# Patient Record
Sex: Male | Born: 1965
Health system: Southern US, Community
[De-identification: ages and names within clinical notes are randomized; demographics above are authoritative.]

## PROBLEM LIST (undated history)

## (undated) DIAGNOSIS — K219 Gastro-esophageal reflux disease without esophagitis: Secondary | ICD-10-CM

## (undated) DIAGNOSIS — E119 Type 2 diabetes mellitus without complications: Secondary | ICD-10-CM

## (undated) DIAGNOSIS — I10 Essential (primary) hypertension: Secondary | ICD-10-CM

## (undated) DIAGNOSIS — E785 Hyperlipidemia, unspecified: Secondary | ICD-10-CM

## (undated) HISTORY — DX: Hyperlipidemia, unspecified: E78.5

## (undated) HISTORY — DX: Essential (primary) hypertension: I10

## (undated) HISTORY — DX: Gastro-esophageal reflux disease without esophagitis: K21.9

## (undated) HISTORY — DX: Type 2 diabetes mellitus without complications: E11.9

---

## 2004-09-12 ENCOUNTER — Ambulatory Visit: Payer: Self-pay | Admitting: Family Medicine

## 2004-11-05 ENCOUNTER — Ambulatory Visit: Payer: Self-pay | Admitting: Family Medicine

## 2004-11-18 ENCOUNTER — Ambulatory Visit: Payer: Self-pay | Admitting: Family Medicine

## 2005-05-27 ENCOUNTER — Ambulatory Visit: Payer: Self-pay | Admitting: Family Medicine

## 2005-11-07 ENCOUNTER — Ambulatory Visit: Payer: Self-pay | Admitting: Family Medicine

## 2006-05-04 ENCOUNTER — Ambulatory Visit: Payer: Self-pay | Admitting: Family Medicine

## 2006-11-03 ENCOUNTER — Ambulatory Visit: Payer: Self-pay | Admitting: Family Medicine

## 2007-01-27 ENCOUNTER — Ambulatory Visit: Payer: Self-pay | Admitting: Family Medicine

## 2013-07-14 ENCOUNTER — Telehealth (HOSPITAL_COMMUNITY): Payer: Self-pay | Admitting: Dietician

## 2013-07-14 NOTE — Telephone Encounter (Signed)
Called and left message at [phone].

## 2013-07-14 NOTE — Telephone Encounter (Signed)
Emailed pt wife at 30.

## 2013-07-14 NOTE — Telephone Encounter (Signed)
Received referral via fax from [facility] for dx: diabetes, hyperlipidemia.

## 2013-07-14 NOTE — Telephone Encounter (Signed)
Received voicemail at [phone] from pt wife returning call.

## 2013-07-14 NOTE — Telephone Encounter (Signed)
Called backed at [phone]. Spoke with pt wife and she requested to email her, so she can check pt schedule. Email is [email or web address]

## 2013-07-20 NOTE — Telephone Encounter (Signed)
Contacted pt wife at 28, 44, and [phone]. Appointment scheduled for 07/21/13 at 1400.

## 2013-07-20 NOTE — Telephone Encounter (Signed)
No response to previous email. Emailed pt wife again at [phone].

## 2013-07-21 ENCOUNTER — Encounter (HOSPITAL_COMMUNITY): Payer: Self-pay | Admitting: Dietician

## 2013-07-21 DIAGNOSIS — I10 Essential (primary) hypertension: Secondary | ICD-10-CM | POA: Insufficient documentation

## 2013-07-21 DIAGNOSIS — E119 Type 2 diabetes mellitus without complications: Secondary | ICD-10-CM | POA: Insufficient documentation

## 2013-07-21 NOTE — Progress Notes (Signed)
Outpatient Initial Nutrition Assessment  Date:07/21/2013   Appt Start Time: 1405  Referring Physician: Dr. Lysbeth Travis Reason for Visit: diabetes, hyperlipidemia  Nutrition Assessment:  Height: 6\' 2"  (188 cm)   Weight: 243 lb (110.224 kg)   IBW: 190#  %IBW: 128% UBW: 250#  %UBW: 97% Body mass index is 31.19 kg/(m^2). Meets criteria for obesity, class I. Goal Weight: 219# (10% loss of current weight) Weight hx: Pt reports UBW of 250-254#.  Estimated nutritional needs:  Kcals/ day: 2000-2100 Protein (grams)/day: 88-110 Fluid (L)/ day: 2.0-2.1  PMH:  Past Medical History  Diagnosis Date  . Diabetes   . HTN (hypertension)     Medications:  Current Outpatient Rx  Name  Route  Sig  Dispense  Refill  . glipiZIDE (GLUCOTROL XL) 5 MG 24 hr tablet   Oral   Take 5 mg by mouth daily.         Marland Kitchen omeprazole (PRILOSEC) 20 MG capsule   Oral   Take 20 mg by mouth daily.           Labs: CMP  No results found for this basename: na, k, cl, co2, glucose, bun, creatinine, calcium, prot, albumin, ast, alt, alkphos, bilitot, gfrnonaa, gfraa    Lipid Panel  No results found for this basename: chol, trig, hdl, cholhdl, vldl, ldlcalc     No results found for this basename: HGBA1C   No results found for this basename: GLUF, MICROALBUR, LDLCALC, CREATININE     Lifestyle/ social habits: Mr. Omar Travis works full time in the [employer]. He reports he works a "crazy schedule". He lives in [place] with his wife, Omar Travis, who is present with him today. He also has 2 adult children who lives with him, a 79 year old son and an 104 year old daughter, who are both in college.  He reports due to his schedule, he does not exercise consistently. Physical activity in the past included joining a gym (which he discontinued the membership this summer due to the warm weather) and walking. He expresses contemplation about walking again.  He reports his stress level as an 7-8/10, citing his  kids growing up as his major source of stress.   Nutrition hx/habits: Mr. Omar Travis reports to dietary indiscretion up until 3 weeks ago, when he was diagnosed with diabetes. Prior to this, he ate "whatever I wanted" and diet was high in soft drinks and excessive high calorie snacks. He reports he continues to snack, but choose healthier options, such as fruit and peanut butter or pita chips. He also repots to decreasing portion sizes and eating out less often (he reports he eats out 1-2 times per week, but will now order an entree and vegetable or a salad). He reports he does not mind eating healthier options, but complains about always being hungry. Pt wife says that their grocery bills have doubled in the past 3 weeks due to eating out less and buying additional snacks to satisfy his hunger.  He reports this is the first time he has had abnormal blood work. He made an appointment with his doctor three weeks ago because he "felt bad" and described excessive thirst and frequent urination. He used his mother's glucometer to check his blood sugar and it was in the high 300's.    Since his visits to the doctor for this event, he was started on Metformin and Glucotrol. He reports Metformin was recently discontinued due to abnormal liver labs, but continues to take Glucotrol daily.  He has no complaints about his medications.  He checks his blood sugar three times a day: AM fasting, PM fasting, dinner postprandial. He reports readings are 100-125 on average. His goal is to control his blood sugar with diet and exercise.   Diet recall: Breakfast (0700): diet coke and English muffin with egg; AM snack (0930): SF pudding; Lunch (1200): [place] sandwich on multigrain bread and veggie chips; Snakc (1430): [place] yogurt; Dinner: Steak, boiled potatoes, and asparagus; Multiple snacks in PM including aplle and peanut butter, pita chips, Beverages consist mainly of water expect for diet coke in AM.   Nutrition Diagnosis:  Nutrition related knowledge deficit r/t newly diagnosed diabetes AEB Hgb A1c: 11.0.  Nutrition Intervention: Nutrition rx: 1800 kcal NAS, diabetic diet; 3 meals per day; limit 3 snacks per day; low calorie beverages only; 2.5 hours physical activity daily  Education/Counseling Provided: Educated pt on principles of diabetic diet. Discussed carbohydrate metabolism in relation to diabetes. Educated pt on basic self-management principles including: signs and symptoms of hyperglycemia and hypoglycemia, goals for fasting and postprandial blood sugars, goals for Hgb A1c, importance of checking feet, importance of keeping PCP appointments, and foot care. Educated pt on plate method, portion sizes, and sources of carbohydrate. Discussed importance of regular meal pattern. Educated on food label reading. Discussed importance of adding sources of whole grains to diet to improve glycemic control. Also encouraged to choose low fat dairy, lean meats, and whole fruits and vegetables more often. Discussed options of artificial sweeteners and encouraged pt to use which brand she liked best. Discussed nutritional content of foods commonly eaten and discussed healthier alternatives. Discussed importance of compliance to prevent further complications of disease. Educated pt on importance of physical activity (goal of at least 30 minutes 5 times per week) along with a healthy diet to achieve weight loss and glycemic goals. Encouraged slow, moderate weight loss of 1-2# per week, or 7-10% of current body weight. Provided"Carbohydrate Counting and Meal Planning" handout. Used TeachBack to assess understanding.   Understanding, Motivation, Ability to Follow Recommendations: Expect fair to good compliance.   Monitoring and Evaluation: Goals: 1) 1-2# weight loss per week; 2) Hgb A1c < 7.0; 3) 2.5 hours physical activity per week  Recommendations: 1) For weight loss: 1800 kcals daily; 2) break up exercise into smaller, more  frequent session; 3) Try water with fruit slices or powdered drink mixes for additional beverage options; 4) Limit extras (cheese, croutons, etc) on salads; 5) Ask for dressing on side and dip fork into dressing with each bite  F/U: PRN. RD contact information provided.   Roby Spalla A. Mayford Knife, RD, LDN 07/21/2013  Appt EndTime: 1515

## 2014-06-29 ENCOUNTER — Other Ambulatory Visit: Payer: Self-pay | Admitting: Gastroenterology

## 2014-06-29 DIAGNOSIS — R932 Abnormal findings on diagnostic imaging of liver and biliary tract: Secondary | ICD-10-CM

## 2014-06-29 DIAGNOSIS — R9389 Abnormal findings on diagnostic imaging of other specified body structures: Secondary | ICD-10-CM

## 2014-07-12 ENCOUNTER — Ambulatory Visit
Admission: RE | Admit: 2014-07-12 | Discharge: 2014-07-12 | Disposition: A | Discharge status: Home / Self Care | Payer: Managed Care, Other (non HMO) | Source: Ambulatory Visit | Attending: Gastroenterology | Admitting: Gastroenterology

## 2014-07-12 DIAGNOSIS — R932 Abnormal findings on diagnostic imaging of liver and biliary tract: Secondary | ICD-10-CM

## 2014-07-12 DIAGNOSIS — R9389 Abnormal findings on diagnostic imaging of other specified body structures: Secondary | ICD-10-CM

## 2014-07-12 MED ORDER — GADOBENATE DIMEGLUMINE 529 MG/ML IV SOLN
20.0000 mL | Freq: Once | INTRAVENOUS | Status: AC | PRN
  Administered 2014-07-12: 20 mL via INTRAVENOUS

## 2014-08-11 ENCOUNTER — Other Ambulatory Visit (HOSPITAL_COMMUNITY): Payer: Self-pay | Admitting: Gastroenterology

## 2014-08-11 DIAGNOSIS — R1013 Epigastric pain: Secondary | ICD-10-CM

## 2014-09-05 ENCOUNTER — Ambulatory Visit (HOSPITAL_COMMUNITY)
Admission: RE | Admit: 2014-09-05 | Discharge: 2014-09-05 | Disposition: A | Discharge status: Home / Self Care | Payer: Managed Care, Other (non HMO) | Source: Ambulatory Visit | Attending: Gastroenterology | Admitting: Gastroenterology

## 2014-09-05 DIAGNOSIS — R110 Nausea: Secondary | ICD-10-CM | POA: Diagnosis present

## 2014-09-05 DIAGNOSIS — R1013 Epigastric pain: Secondary | ICD-10-CM | POA: Insufficient documentation

## 2014-09-05 MED ORDER — SINCALIDE 5 MCG IJ SOLR
0.0200 ug/kg | Freq: Once | INTRAMUSCULAR | Status: AC
  Administered 2014-09-05: 2.2 ug via INTRAVENOUS

## 2014-09-05 MED ORDER — TECHNETIUM TC 99M MEBROFENIN IV KIT
5.0000 | PACK | Freq: Once | INTRAVENOUS | Status: AC | PRN
  Administered 2014-09-05: 5 via INTRAVENOUS

## 2017-08-04 ENCOUNTER — Other Ambulatory Visit: Payer: Self-pay | Admitting: Gastroenterology

## 2017-08-04 DIAGNOSIS — R935 Abnormal findings on diagnostic imaging of other abdominal regions, including retroperitoneum: Secondary | ICD-10-CM

## 2017-08-04 DIAGNOSIS — R748 Abnormal levels of other serum enzymes: Secondary | ICD-10-CM

## 2017-08-19 ENCOUNTER — Ambulatory Visit
Admission: RE | Admit: 2017-08-19 | Discharge: 2017-08-19 | Disposition: A | Discharge status: Home / Self Care | Payer: Managed Care, Other (non HMO) | Source: Ambulatory Visit | Attending: Gastroenterology | Admitting: Gastroenterology

## 2017-08-19 DIAGNOSIS — R748 Abnormal levels of other serum enzymes: Secondary | ICD-10-CM

## 2017-08-19 DIAGNOSIS — R935 Abnormal findings on diagnostic imaging of other abdominal regions, including retroperitoneum: Secondary | ICD-10-CM

## 2017-08-19 MED ORDER — GADOBENATE DIMEGLUMINE 529 MG/ML IV SOLN
20.0000 mL | Freq: Once | INTRAVENOUS | Status: AC | PRN
  Administered 2017-08-19: 20 mL via INTRAVENOUS

## 2018-04-12 DIAGNOSIS — F3342 Major depressive disorder, recurrent, in full remission: Secondary | ICD-10-CM | POA: Diagnosis not present

## 2018-04-12 DIAGNOSIS — K219 Gastro-esophageal reflux disease without esophagitis: Secondary | ICD-10-CM | POA: Diagnosis not present

## 2018-04-12 DIAGNOSIS — E1169 Type 2 diabetes mellitus with other specified complication: Secondary | ICD-10-CM | POA: Diagnosis not present

## 2018-04-12 DIAGNOSIS — I10 Essential (primary) hypertension: Secondary | ICD-10-CM | POA: Diagnosis not present

## 2018-04-12 DIAGNOSIS — E1159 Type 2 diabetes mellitus with other circulatory complications: Secondary | ICD-10-CM | POA: Diagnosis not present

## 2018-04-19 DIAGNOSIS — I10 Essential (primary) hypertension: Secondary | ICD-10-CM | POA: Diagnosis not present

## 2018-04-19 DIAGNOSIS — E1159 Type 2 diabetes mellitus with other circulatory complications: Secondary | ICD-10-CM | POA: Diagnosis not present

## 2018-05-03 DIAGNOSIS — R509 Fever, unspecified: Secondary | ICD-10-CM | POA: Diagnosis not present

## 2018-05-03 DIAGNOSIS — I10 Essential (primary) hypertension: Secondary | ICD-10-CM | POA: Diagnosis not present

## 2018-05-03 DIAGNOSIS — E1159 Type 2 diabetes mellitus with other circulatory complications: Secondary | ICD-10-CM | POA: Diagnosis not present

## 2018-05-03 DIAGNOSIS — R197 Diarrhea, unspecified: Secondary | ICD-10-CM | POA: Diagnosis not present

## 2018-05-31 DIAGNOSIS — Z794 Long term (current) use of insulin: Secondary | ICD-10-CM | POA: Diagnosis not present

## 2018-05-31 DIAGNOSIS — E1169 Type 2 diabetes mellitus with other specified complication: Secondary | ICD-10-CM | POA: Diagnosis not present

## 2018-05-31 DIAGNOSIS — E785 Hyperlipidemia, unspecified: Secondary | ICD-10-CM | POA: Diagnosis not present

## 2018-05-31 DIAGNOSIS — E1159 Type 2 diabetes mellitus with other circulatory complications: Secondary | ICD-10-CM | POA: Diagnosis not present

## 2018-05-31 DIAGNOSIS — E1165 Type 2 diabetes mellitus with hyperglycemia: Secondary | ICD-10-CM | POA: Diagnosis not present

## 2018-05-31 DIAGNOSIS — I10 Essential (primary) hypertension: Secondary | ICD-10-CM | POA: Diagnosis not present

## 2018-07-12 DIAGNOSIS — I10 Essential (primary) hypertension: Secondary | ICD-10-CM | POA: Diagnosis not present

## 2018-07-12 DIAGNOSIS — Z23 Encounter for immunization: Secondary | ICD-10-CM | POA: Diagnosis not present

## 2018-07-12 DIAGNOSIS — E1169 Type 2 diabetes mellitus with other specified complication: Secondary | ICD-10-CM | POA: Diagnosis not present

## 2018-07-12 DIAGNOSIS — F3342 Major depressive disorder, recurrent, in full remission: Secondary | ICD-10-CM | POA: Diagnosis not present

## 2018-07-12 DIAGNOSIS — E1159 Type 2 diabetes mellitus with other circulatory complications: Secondary | ICD-10-CM | POA: Diagnosis not present

## 2018-07-12 DIAGNOSIS — K219 Gastro-esophageal reflux disease without esophagitis: Secondary | ICD-10-CM | POA: Diagnosis not present

## 2018-09-01 DIAGNOSIS — E1159 Type 2 diabetes mellitus with other circulatory complications: Secondary | ICD-10-CM | POA: Diagnosis not present

## 2018-09-01 DIAGNOSIS — Z794 Long term (current) use of insulin: Secondary | ICD-10-CM | POA: Diagnosis not present

## 2018-09-01 DIAGNOSIS — E1165 Type 2 diabetes mellitus with hyperglycemia: Secondary | ICD-10-CM | POA: Diagnosis not present

## 2018-09-01 DIAGNOSIS — I10 Essential (primary) hypertension: Secondary | ICD-10-CM | POA: Diagnosis not present

## 2018-10-13 DIAGNOSIS — E785 Hyperlipidemia, unspecified: Secondary | ICD-10-CM | POA: Diagnosis not present

## 2018-10-13 DIAGNOSIS — K219 Gastro-esophageal reflux disease without esophagitis: Secondary | ICD-10-CM | POA: Diagnosis not present

## 2018-10-13 DIAGNOSIS — Z125 Encounter for screening for malignant neoplasm of prostate: Secondary | ICD-10-CM | POA: Diagnosis not present

## 2018-10-13 DIAGNOSIS — E1159 Type 2 diabetes mellitus with other circulatory complications: Secondary | ICD-10-CM | POA: Diagnosis not present

## 2018-10-13 DIAGNOSIS — I10 Essential (primary) hypertension: Secondary | ICD-10-CM | POA: Diagnosis not present

## 2018-10-13 DIAGNOSIS — E1169 Type 2 diabetes mellitus with other specified complication: Secondary | ICD-10-CM | POA: Diagnosis not present

## 2018-10-13 DIAGNOSIS — F3342 Major depressive disorder, recurrent, in full remission: Secondary | ICD-10-CM | POA: Diagnosis not present

## 2018-12-07 DIAGNOSIS — Z794 Long term (current) use of insulin: Secondary | ICD-10-CM | POA: Diagnosis not present

## 2018-12-07 DIAGNOSIS — I10 Essential (primary) hypertension: Secondary | ICD-10-CM | POA: Diagnosis not present

## 2018-12-07 DIAGNOSIS — E1165 Type 2 diabetes mellitus with hyperglycemia: Secondary | ICD-10-CM | POA: Diagnosis not present

## 2018-12-07 DIAGNOSIS — E1169 Type 2 diabetes mellitus with other specified complication: Secondary | ICD-10-CM | POA: Diagnosis not present

## 2018-12-07 DIAGNOSIS — E1159 Type 2 diabetes mellitus with other circulatory complications: Secondary | ICD-10-CM | POA: Diagnosis not present

## 2019-03-21 DIAGNOSIS — I10 Essential (primary) hypertension: Secondary | ICD-10-CM | POA: Diagnosis not present

## 2019-03-21 DIAGNOSIS — H02844 Edema of left upper eyelid: Secondary | ICD-10-CM | POA: Diagnosis not present

## 2019-03-21 DIAGNOSIS — H02841 Edema of right upper eyelid: Secondary | ICD-10-CM | POA: Diagnosis not present

## 2019-03-21 DIAGNOSIS — H11423 Conjunctival edema, bilateral: Secondary | ICD-10-CM | POA: Diagnosis not present

## 2019-03-21 DIAGNOSIS — H0289 Other specified disorders of eyelid: Secondary | ICD-10-CM | POA: Diagnosis not present

## 2019-03-21 DIAGNOSIS — E1159 Type 2 diabetes mellitus with other circulatory complications: Secondary | ICD-10-CM | POA: Diagnosis not present

## 2019-05-24 DIAGNOSIS — Z794 Long term (current) use of insulin: Secondary | ICD-10-CM | POA: Diagnosis not present

## 2019-05-24 DIAGNOSIS — E1165 Type 2 diabetes mellitus with hyperglycemia: Secondary | ICD-10-CM | POA: Diagnosis not present

## 2019-06-22 ENCOUNTER — Other Ambulatory Visit: Payer: Self-pay

## 2019-06-22 ENCOUNTER — Encounter: Payer: Self-pay | Admitting: Family Medicine

## 2019-06-22 ENCOUNTER — Ambulatory Visit: Payer: BC Managed Care – PPO | Admitting: Family Medicine

## 2019-06-22 ENCOUNTER — Ambulatory Visit: Payer: Self-pay | Admitting: Family Medicine

## 2019-06-22 VITALS — BP 120/80 | HR 102 | Temp 97.1°F | Ht 74.0 in | Wt 239.0 lb

## 2019-06-22 DIAGNOSIS — E1165 Type 2 diabetes mellitus with hyperglycemia: Secondary | ICD-10-CM | POA: Diagnosis not present

## 2019-06-22 DIAGNOSIS — I10 Essential (primary) hypertension: Secondary | ICD-10-CM | POA: Diagnosis not present

## 2019-06-22 DIAGNOSIS — Z7689 Persons encountering health services in other specified circumstances: Secondary | ICD-10-CM

## 2019-06-22 DIAGNOSIS — E119 Type 2 diabetes mellitus without complications: Secondary | ICD-10-CM | POA: Diagnosis not present

## 2019-06-22 DIAGNOSIS — E785 Hyperlipidemia, unspecified: Secondary | ICD-10-CM | POA: Diagnosis not present

## 2019-06-22 DIAGNOSIS — Z794 Long term (current) use of insulin: Secondary | ICD-10-CM | POA: Diagnosis not present

## 2019-06-22 DIAGNOSIS — K219 Gastro-esophageal reflux disease without esophagitis: Secondary | ICD-10-CM | POA: Insufficient documentation

## 2019-06-22 MED ORDER — OMEPRAZOLE 20 MG PO CPDR: 20.0000 mg | DELAYED_RELEASE_CAPSULE | Freq: Every day | ORAL | 3 refills | Status: DC

## 2019-06-22 NOTE — Progress Notes (Signed)
Patient presents to clinic today to establish care.  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    DM2: -dx'd 6 yrs ago -Patient taking Invokana 300 mg daily, Ozempic 1 mg weekly, Toujeo 50 units daily -Patient not been checking FS BS recently as he needs new battery for his glucometer -Patient states he is eating any and everything. -Patient denies regular exercise  HTN: -Taking lisinopril 5 mg daily -Not checking BP at home and states " I do not have a need to.  You see what my blood pressure is today".  HLD: -Taking rosuvastatin 5 mg daily and fenofibrate 135 mg daily  GERD: -Taking Prilosec 20 mg daily  Allergies: Flexeril-headaches Bee stings-anaphylaxis.  Does not have an EpiPen. Benadryl-unsure of true allergic reaction.  States took after a bee sting but symptoms became worse.  Social history: Patient is married.  Patient is employed as a Freight forwarder for [employer].  Patient endorses alcohol use.  Patient denies tobacco use or drug use.  Family medical history: Non-alive, diabetes, heart disease Dad-deceased, bladder cancer 2/2 medications, diabetes, tobacco use MGM-deceased, diabetes MGF-deceased, cancer  Health Maintenance: Dental -- Vision --my eye Dr. Debe Coder, [place] Endocrinologist Malva Limes, NP Immunizations --influenza vaccine 2019 Colonoscopy --needs to schedule   Past Medical History:  Diagnosis Date  . Diabetes ([place])   . GERD (gastroesophageal reflux disease)   . HTN (hypertension)   . Hyperlipidemia     History reviewed. No pertinent surgical history.  Current Outpatient Medications on File Prior to Visit  Medication Sig Dispense Refill  . glipiZIDE (GLUCOTROL XL) 5 MG 24 hr tablet Take 5 mg by mouth daily.    Marland Kitchen omeprazole (PRILOSEC) 20 MG capsule Take 20 mg by mouth daily.     No current facility-administered  medications on file prior to visit.     No Known Allergies  Family History  Problem Relation Age of Onset  . Diabetes Mother   . Liver cancer Father   . Diabetes Father     Social History   Socioeconomic History  . Marital status: Married    Spouse name: Not on file  . Number of children: Not on file  . Years of education: Not on file  . Highest education level: Not on file  Occupational History  . Not on file  Social Needs  . Financial resource strain: Not on file  . Food insecurity    Worry: Not on file    Inability: Not on file  . Transportation needs    Medical: Not on file    Non-medical: Not on file  Tobacco Use  . Smoking status: Never Smoker  . Smokeless tobacco: Never Used  Substance and Sexual Activity  . Alcohol use: Yes  . Drug use: Never  . Sexual activity: Yes  Lifestyle  . Physical activity    Days per week: Not on file    Minutes per session: Not on file  . Stress: Not on file  Relationships  . Social Herbalist on phone: Not on file    Gets together: Not on file    Attends religious service: Not on file    Active member of club or organization: Not on file    Attends meetings of clubs or organizations: Not on file    Relationship status: Not on file  .  Intimate partner violence    Fear of current or ex partner: Not on file    Emotionally abused: Not on file    Physically abused: Not on file    Forced sexual activity: Not on file  Other Topics Concern  . Not on file  Social History Narrative  . Not on file    ROS General: Denies fever, chills, night sweats, changes in weight, changes in appetite HEENT: Denies headaches, ear pain, changes in vision, rhinorrhea, sore throat CV: Denies CP, palpitations, SOB, orthopnea Pulm: Denies SOB, cough, wheezing GI: Denies abdominal pain, nausea, vomiting, diarrhea, constipation GU: Denies dysuria, hematuria, frequency, vaginal discharge Msk: Denies muscle cramps, joint pains Neuro:  Denies weakness, numbness, tingling Skin: Denies rashes, bruising Psych: Denies depression, anxiety, hallucinations   BP 120/80 (BP Location: Left Arm, Patient Position: Sitting, Cuff Size: Large)   Pulse (!) 102   Temp (!) 97.1 F (36.2 C) (Oral)   Ht 6\' 2"  (1.88 m)   Wt 239 lb (108.4 kg)   SpO2 96%   BMI 30.69 kg/m   Physical Exam Gen. Pleasant, well developed, well-nourished, in NAD HEENT - [place]/AT, PERRL, conjunctive clear, no scleral icterus, no nasal drainage Lungs: no use of accessory muscles, CTAB, no wheezes, rales or rhonchi Cardiovascular: RRR, No r/g/m, no peripheral edema Abdomen: BS present, soft, nontender,nondistended Neuro:  A&Ox3, CN II-XII intact, normal gait Skin:  Warm, dry, intact, no lesions  No results found for this or any previous visit (from the past 2160 hour(s)).  Assessment/Plan: Essential hypertension -controlled -continue lisinopril 5 mg daily -Discussed lifestyle modifications.  Gastroesophageal reflux disease, esophagitis presence not specified  - Plan: omeprazole (PRILOSEC) 20 MG capsule  Diabetes mellitus without complication ([place]) -Discussed need to replace battery of glucometer check FS BS regularly -Continue Invokana milligrams daily, Ozempic 1 mg weekly, Toujeo 50 units daily -Discussed lifestyle modifications -We will obtain hemoglobin A1c at next OFV -Continue follow-up with endocrinology  Hyperlipidemia, unspecified hyperlipidemia type -Continue current medications rosuvastatin 5 mg and fenofibrate 1.5 mg -Discussed lifestyle modification -We will obtain lipid panel at next OV.  Encounter to establish care -We reviewed the PMH, PSH, FH, SH, Meds and Allergies. -We provided refills for any medications we will prescribe as needed. -We addressed current concerns per orders and patient instructions. -We have asked for records for pertinent exams, studies, vaccines and notes from previous providers. -We have advised patient to  follow up per instructions below.  F/u in 1 month for CPE  Grier Mitts, MD

## 2019-06-22 NOTE — Patient Instructions (Signed)
Preventing Diabetes Mellitus Complications You can take action to prevent or slow down problems that are caused by diabetes (diabetes mellitus). Following your diabetes plan and taking care of yourself can reduce your risk of serious or life-threatening complications. What actions can I take to prevent diabetes complications? Manage your diabetes   Follow instructions from your health care providers about managing your diabetes. Your diabetes may be managed by a team of health care providers who can teach you how to care for yourself and can answer questions that you have.  Educate yourself about your condition so you can make healthy choices about eating and physical activity.  Check your blood sugar (glucose) levels as often as directed. Your health care provider will help you decide how often to check your blood glucose level depending on your treatment goals and how well you are meeting them.  Ask your health care provider if you should take low-dose aspirin daily and what dose is recommended for you. Taking low-dose aspirin daily is recommended to help prevent cardiovascular disease. Do not use nicotine or tobacco Do not use any products that contain nicotine or tobacco, such as cigarettes and e-cigarettes. If you need help quitting, ask your health care provider. Nicotine raises your risk for diabetes problems. If you quit using nicotine:  You will lower your risk for heart attack, stroke, nerve disease, and kidney disease.  Your cholesterol and blood pressure may improve.  Your blood circulation will improve. Keep your blood pressure under control Your personal target blood pressure is determined based on:  Your age.  Your medicines.  How long you have had diabetes.  Any other medical conditions you have. To control your blood pressure:  Follow instructions from your health care provider about meal planning, exercise, and medicines.  Make sure your health care provider  checks your blood pressure at every medical visit.  Monitor your blood pressure at home as told by your health care provider.  Keep your cholesterol under control To control your cholesterol:  Follow instructions from your health care provider about meal planning, exercise, and medicines.  Have your cholesterol checked at least once a year.  You may be prescribed medicine to lower cholesterol (statin). If you are not taking a statin, ask your health care provider if you should be. Controlling your cholesterol may:  Help prevent heart disease and stroke. These are the most common health problems for people with diabetes.  Improve your blood flow. Schedule and keep yearly physical exams and eye exams Your health care provider will tell you how often you need medical visits depending on your diabetes management plan. Keep all follow-up visits as directed. This is important so possible problems can be identified early and complications can be avoided or treated.  Every visit with your health care provider should include measuring your: ? Weight. ? Blood pressure. ? Blood glucose control.  Your A1c (hemoglobin A1c) level should be checked: ? At least 2 times a year, if you are meeting your treatment goals. ? 4 times a year, if you are not meeting treatment goals or if your treatment goals have changed.  Your blood lipids (lipid profile) should be checked yearly. You should also be checked yearly for protein in your urine (urine microalbumin).  If you have type 1 diabetes, get an eye exam 3-5 years after you are diagnosed, and then once a year after your first exam.  If you have type 2 diabetes, get an eye exam as soon as you  are diagnosed, and then once a year after your first exam. Keep your vaccines current It is recommended that you receive:  A flu (influenza) vaccine every year.  A pneumonia (pneumococcal) vaccine and a hepatitis B vaccine. If you are age 51 or older, you may  get the pneumonia vaccine as a series of two separate shots. Ask your health care provider which other vaccines may be recommended. Take care of your feet Diabetes may cause you to have poor blood circulation to your legs and feet. Because of this, taking care of your feet is very important. Diabetes can cause:  The skin on the feet to get thinner, break more easily, and heal more slowly.  Nerve damage in your legs and feet, which results in decreased feeling. You may not notice minor injuries that could lead to serious problems. To avoid foot problems:  Check your skin and feet every day for cuts, bruises, redness, blisters, or sores.  Schedule a foot exam with your health care provider once every year. This exam includes: ? Inspecting of the structure and skin of your feet. ? Checking the pulses and sensation in your feet.  Make sure that your health care provider performs a visual foot exam at every medical visit.  Take care of your teeth People with poorly controlled diabetes are more likely to have gum (periodontal) disease. Diabetes can make periodontal diseases harder to control. If not treated, periodontal diseases can lead to tooth loss. To prevent this:  Brush your teeth twice a day.  Floss at least once a day.  Visit your dentist 2 times a year. Drink responsibly Limit alcohol intake to no more than 1 drink a day for nonpregnant women and 2 drinks a day for men. One drink equals 12 oz of beer, 5 oz of wine, or 1 oz of hard liquor.  It is important to eat food when you drink alcohol to avoid low blood glucose (hypoglycemia). Avoid alcohol if you:  Have a history of alcohol abuse or dependence.  Are pregnant.  Have liver disease, pancreatitis, advanced neuropathy, or severe hypertriglyceridemia. Lessen stress Living with diabetes can be stressful. When you are experiencing stress, your blood glucose may be affected in two ways:  Stress hormones may cause your blood  glucose to rise.  You may be distracted from taking good care of yourself. Be aware of your stress level and make changes to help you manage challenging situations. To lower your stress levels:  Consider joining a support group.  Do planned relaxation or meditation.  Do a hobby that you enjoy.  Maintain healthy relationships.  Exercise regularly.  Work with your health care provider or a mental health professional. Summary  You can take action to prevent or slow down problems that are caused by diabetes (diabetes mellitus). Following your diabetes plan and taking care of yourself can reduce your risk of serious or life-threatening complications.  Follow instructions from your health care providers about managing your diabetes. Your diabetes may be managed by a team of health care providers who can teach you how to care for yourself and can answer questions that you have.  Your health care provider will tell you how often you need medical visits depending on your diabetes management plan. Keep all follow-up visits as directed. This is important so possible problems can be identified early and complications can be avoided or treated. This information is not intended to replace advice given to you by your health care provider. Make sure  you discuss any questions you have with your health care provider. Document Released: 05/27/2011 Document Revised: 12/07/2017 Document Reviewed: 06/07/2016 Elsevier Patient Education  2020 [place] Your Hypertension Hypertension is commonly called high blood pressure. This is when the force of your blood pressing against the walls of your arteries is too strong. Arteries are blood vessels that carry blood from your heart throughout your body. Hypertension forces the heart to work harder to pump blood, and may cause the arteries to become narrow or stiff. Having untreated or uncontrolled hypertension can cause heart attack, stroke, kidney  disease, and other problems. What are blood pressure readings? A blood pressure reading consists of a higher number over a lower number. Ideally, your blood pressure should be below 120/80. The first ("top") number is called the systolic pressure. It is a measure of the pressure in your arteries as your heart beats. The second ("bottom") number is called the diastolic pressure. It is a measure of the pressure in your arteries as the heart relaxes. What does my blood pressure reading mean? Blood pressure is classified into four stages. Based on your blood pressure reading, your health care provider may use the following stages to determine what type of treatment you need, if any. Systolic pressure and diastolic pressure are measured in a unit called mm Hg. Normal  Systolic pressure: below 123456.  Diastolic pressure: below 80. Elevated  Systolic pressure: [id number].  Diastolic pressure: below 80. Hypertension stage 1  Systolic pressure: 0000000.  Diastolic pressure: [id number]. Hypertension stage 2  Systolic pressure: [id number] or above.  Diastolic pressure: 90 or above. What health risks are associated with hypertension? Managing your hypertension is an important responsibility. Uncontrolled hypertension can lead to:  A heart attack.  A stroke.  A weakened blood vessel (aneurysm).  Heart failure.  Kidney damage.  Eye damage.  Metabolic syndrome.  Memory and concentration problems. What changes can I make to manage my hypertension? Hypertension can be managed by making lifestyle changes and possibly by taking medicines. Your health care provider will help you make a plan to bring your blood pressure within a normal range. Eating and drinking   Eat a diet that is high in fiber and potassium, and low in salt (sodium), added sugar, and fat. An example eating plan is called the DASH (Dietary Approaches to Stop Hypertension) diet. To eat this way: ? Eat plenty of fresh fruits and  vegetables. Try to fill half of your plate at each meal with fruits and vegetables. ? Eat whole grains, such as whole wheat pasta, brown rice, or whole grain bread. Fill about one quarter of your plate with whole grains. ? Eat low-fat diary products. ? Avoid fatty cuts of meat, processed or cured meats, and poultry with skin. Fill about one quarter of your plate with lean proteins such as fish, chicken without skin, beans, eggs, and tofu. ? Avoid premade and processed foods. These tend to be higher in sodium, added sugar, and fat.  Reduce your daily sodium intake. Most people with hypertension should eat less than 1,500 mg of sodium a day.  Limit alcohol intake to no more than 1 drink a day for nonpregnant women and 2 drinks a day for men. One drink equals 12 oz of beer, 5 oz of wine, or 1 oz of hard liquor. Lifestyle  Work with your health care provider to maintain a healthy body weight, or to lose weight. Ask what an ideal weight is for you.  Get at  least 30 minutes of exercise that causes your heart to beat faster (aerobic exercise) most days of the week. Activities may include walking, swimming, or biking.  Include exercise to strengthen your muscles (resistance exercise), such as weight lifting, as part of your weekly exercise routine. Try to do these types of exercises for 30 minutes at least 3 days a week.  Do not use any products that contain nicotine or tobacco, such as cigarettes and e-cigarettes. If you need help quitting, ask your health care provider.  Control any long-term (chronic) conditions you have, such as high cholesterol or diabetes. Monitoring  Monitor your blood pressure at home as told by your health care provider. Your personal target blood pressure may vary depending on your medical conditions, your age, and other factors.  Have your blood pressure checked regularly, as often as told by your health care provider. Working with your health care provider  Review all  the medicines you take with your health care provider because there may be side effects or interactions.  Talk with your health care provider about your diet, exercise habits, and other lifestyle factors that may be contributing to hypertension.  Visit your health care provider regularly. Your health care provider can help you create and adjust your plan for managing hypertension. Will I need medicine to control my blood pressure? Your health care provider may prescribe medicine if lifestyle changes are not enough to get your blood pressure under control, and if:  Your systolic blood pressure is 130 or higher.  Your diastolic blood pressure is 80 or higher. Take medicines only as told by your health care provider. Follow the directions carefully. Blood pressure medicines must be taken as prescribed. The medicine does not work as well when you skip doses. Skipping doses also puts you at risk for problems. Contact a health care provider if:  You think you are having a reaction to medicines you have taken.  You have repeated (recurrent) headaches.  You feel dizzy.  You have swelling in your ankles.  You have trouble with your vision. Get help right away if:  You develop a severe headache or confusion.  You have unusual weakness or numbness, or you feel faint.  You have severe pain in your chest or abdomen.  You vomit repeatedly.  You have trouble breathing. Summary  Hypertension is when the force of blood pumping through your arteries is too strong. If this condition is not controlled, it may put you at risk for serious complications.  Your personal target blood pressure may vary depending on your medical conditions, your age, and other factors. For most people, a normal blood pressure is less than 120/80.  Hypertension is managed by lifestyle changes, medicines, or both. Lifestyle changes include weight loss, eating a healthy, low-sodium diet, exercising more, and limiting  alcohol. This information is not intended to replace advice given to you by your health care provider. Make sure you discuss any questions you have with your health care provider. Document Released: 06/02/2012 Document Revised: 12/31/2018 Document Reviewed: 08/06/2016 Elsevier Patient Education  2020 [place] for Gastroesophageal Reflux Disease, Adult When you have gastroesophageal reflux disease (GERD), the foods you eat and your eating habits are very important. Choosing the right foods can help ease your discomfort. Think about working with a nutrition specialist (dietitian) to help you make good choices. What are tips for following this plan?  Meals  Choose healthy foods that are low in fat, such as fruits, vegetables,  whole grains, low-fat dairy products, and lean meat, fish, and poultry.  Eat small meals often instead of 3 large meals a day. Eat your meals slowly, and in a place where you are relaxed. Avoid bending over or lying down until 2-3 hours after eating.  Avoid eating meals 2-3 hours before bed.  Avoid drinking a lot of liquid with meals.  Cook foods using methods other than frying. Bake, grill, or broil food instead.  Avoid or limit: ? Chocolate. ? Peppermint or spearmint. ? Alcohol. ? Pepper. ? Black and decaffeinated coffee. ? Black and decaffeinated tea. ? Bubbly (carbonated) soft drinks. ? Caffeinated energy drinks and soft drinks.  Limit high-fat foods such as: ? Fatty meat or fried foods. ? Whole milk, cream, butter, or ice cream. ? Nuts and nut butters. ? Pastries, donuts, and sweets made with butter or shortening.  Avoid foods that cause symptoms. These foods may be different for everyone. Common foods that cause symptoms include: ? Tomatoes. ? Oranges, lemons, and limes. ? Peppers. ? Spicy food. ? Onions and garlic. ? Vinegar. Lifestyle  Maintain a healthy weight. Ask your doctor what weight is healthy for you. If you need to  lose weight, work with your doctor to do so safely.  Exercise for at least 30 minutes for 5 or more days each week, or as told by your doctor.  Wear loose-fitting clothes.  Do not smoke. If you need help quitting, ask your doctor.  Sleep with the head of your bed higher than your feet. Use a wedge under the mattress or blocks under the bed frame to raise the head of the bed. Summary  When you have gastroesophageal reflux disease (GERD), food and lifestyle choices are very important in easing your symptoms.  Eat small meals often instead of 3 large meals a day. Eat your meals slowly, and in a place where you are relaxed.  Limit high-fat foods such as fatty meat or fried foods.  Avoid bending over or lying down until 2-3 hours after eating.  Avoid peppermint and spearmint, caffeine, alcohol, and chocolate. This information is not intended to replace advice given to you by your health care provider. Make sure you discuss any questions you have with your health care provider. Document Released: 03/09/2012 Document Revised: 12/30/2018 Document Reviewed: 10/14/2016 Elsevier Patient Education  [place].  Dyslipidemia Dyslipidemia is an imbalance of waxy, fat-like substances (lipids) in the blood. The body needs lipids in small amounts. Dyslipidemia often involves a high level of cholesterol or triglycerides, which are types of lipids. Common forms of dyslipidemia include:  High levels of LDL cholesterol. LDL is the type of cholesterol that causes fatty deposits (plaques) to build up in the blood vessels that carry blood away from your heart (arteries).  Low levels of HDL cholesterol. HDL cholesterol is the type of cholesterol that protects against heart disease. High levels of HDL remove the LDL buildup from arteries.  High levels of triglycerides. Triglycerides are a fatty substance in the blood that is linked to a buildup of plaques in the arteries. What are the causes? Primary  dyslipidemia is caused by changes (mutations) in genes that are passed down through families (inherited). These mutations cause several types of dyslipidemia. Secondary dyslipidemia is caused by lifestyle choices and diseases that lead to dyslipidemia, such as:  Eating a diet that is high in animal fat.  Not getting enough exercise.  Having diabetes, kidney disease, liver disease, or thyroid disease.  Drinking large amounts  of alcohol.  Using certain medicines. What increases the risk? You are more likely to develop this condition if you are an older man or if you are a woman who has gone through menopause. Other risk factors include:  Having a family history of dyslipidemia.  Taking certain medicines, including birth control pills, steroids, some diuretics, and beta-blockers.  Smoking cigarettes.  Eating a high-fat diet.  Having certain medical conditions such as diabetes, polycystic ovary syndrome (PCOS), kidney disease, liver disease, or hypothyroidism.  Not exercising regularly.  Being overweight or obese with too much belly fat. What are the signs or symptoms? In most cases, dyslipidemia does not usually cause any symptoms. In severe cases, very high lipid levels can cause:  Fatty bumps under the skin (xanthomas).  White or gray ring around the black center (pupil) of the eye. Very high triglyceride levels can cause inflammation of the pancreas (pancreatitis). How is this diagnosed? Your health care provider may diagnose dyslipidemia based on a routine blood test (fasting blood test). Because most people do not have symptoms of the condition, this blood testing (lipid profile) is done on adults age 66 and older and is repeated every 5 years. This test checks:  Total cholesterol. This measures the total amount of cholesterol in your blood, including LDL cholesterol, HDL cholesterol, and triglycerides. A healthy number is below 200.  LDL cholesterol. The target number for  LDL cholesterol is different for each person, depending on individual risk factors. Ask your health care provider what your LDL cholesterol should be.  HDL cholesterol. An HDL level of 60 or higher is best because it helps to protect against heart disease. A number below 14 for men or below 95 for women increases the risk for heart disease.  Triglycerides. A healthy triglyceride number is below 150. If your lipid profile is abnormal, your health care provider may do other blood tests. How is this treated? Treatment depends on the type of dyslipidemia that you have and your other risk factors for heart disease and stroke. Your health care provider will have a target range for your lipid levels based on this information. For many people, this condition may be treated by lifestyle changes, such as diet and exercise. Your health care provider may recommend that you:  Get regular exercise.  Make changes to your diet.  Quit smoking if you smoke. If diet changes and exercise do not help you reach your goals, your health care provider may also prescribe medicine to lower lipids. The most commonly prescribed type of medicine lowers your LDL cholesterol (statin drug). If you have a high triglyceride level, your provider may prescribe another type of drug (fibrate) or an omega-3 fish oil supplement, or both. Follow these instructions at home:  Eating and drinking  Follow instructions from your health care provider or dietitian about eating or drinking restrictions.  Eat a healthy diet as told by your health care provider. This can help you reach and maintain a healthy weight, lower your LDL cholesterol, and raise your HDL cholesterol. This may include: ? Limiting your calories, if you are overweight. ? Eating more fruits, vegetables, whole grains, fish, and lean meats. ? Limiting saturated fat, trans fat, and cholesterol.  If you drink alcohol: ? Limit how much you use. ? Be aware of how much  alcohol is in your drink. In the [place] one drink equals one 12 oz bottle of beer (355 mL), one 5 oz glass of wine (148 mL), or one 1 oz  glass of hard liquor (44 mL).  Do not drink alcohol if: ? Your health care provider tells you not to drink. ? You are pregnant, may be pregnant, or are planning to become pregnant. Activity  Get regular exercise. Start an exercise and strength training program as told by your health care provider. Ask your health care provider what activities are safe for you. Your health care provider may recommend: ? 30 minutes of aerobic activity 4-6 days a week. Brisk walking is an example of aerobic activity. ? Strength training 2 days a week. General instructions  Do not use any products that contain nicotine or tobacco, such as cigarettes, e-cigarettes, and chewing tobacco. If you need help quitting, ask your health care provider.  Take over-the-counter and prescription medicines only as told by your health care provider. This includes supplements.  Keep all follow-up visits as told by your health care provider. Contact a health care provider if:  You are: ? Having trouble sticking to your exercise or diet plan. ? Struggling to quit smoking or control your use of alcohol. Summary  Dyslipidemia often involves a high level of cholesterol or triglycerides, which are types of lipids.  Treatment depends on the type of dyslipidemia that you have and your other risk factors for heart disease and stroke.  For many people, treatment starts with lifestyle changes, such as diet and exercise.  Your health care provider may prescribe medicine to lower lipids. This information is not intended to replace advice given to you by your health care provider. Make sure you discuss any questions you have with your health care provider. Document Released: 09/13/2013 Document Revised: 05/03/2018 Document Reviewed: 04/09/2018 Elsevier Patient Education  [place].

## 2019-07-25 ENCOUNTER — Telehealth: Payer: BC Managed Care – PPO | Admitting: Family Medicine

## 2019-08-03 ENCOUNTER — Ambulatory Visit (INDEPENDENT_AMBULATORY_CARE_PROVIDER_SITE_OTHER): Payer: BC Managed Care – PPO | Admitting: Family Medicine

## 2019-08-03 ENCOUNTER — Other Ambulatory Visit: Payer: Self-pay

## 2019-08-03 ENCOUNTER — Encounter: Payer: Self-pay | Admitting: Family Medicine

## 2019-08-03 ENCOUNTER — Other Ambulatory Visit: Payer: BC Managed Care – PPO

## 2019-08-03 VITALS — BP 98/76 | HR 102 | Temp 97.7°F | Wt 246.0 lb

## 2019-08-03 DIAGNOSIS — Z0000 Encounter for general adult medical examination without abnormal findings: Secondary | ICD-10-CM | POA: Diagnosis not present

## 2019-08-03 DIAGNOSIS — E119 Type 2 diabetes mellitus without complications: Secondary | ICD-10-CM

## 2019-08-03 DIAGNOSIS — E785 Hyperlipidemia, unspecified: Secondary | ICD-10-CM | POA: Diagnosis not present

## 2019-08-03 DIAGNOSIS — Z1211 Encounter for screening for malignant neoplasm of colon: Secondary | ICD-10-CM

## 2019-08-03 DIAGNOSIS — I10 Essential (primary) hypertension: Secondary | ICD-10-CM

## 2019-08-03 LAB — CBC WITH DIFFERENTIAL/PLATELET
Basophils Absolute: 0 10*3/uL (ref 0.0–0.1)
Basophils Relative: 0.5 % (ref 0.0–3.0)
Eosinophils Absolute: 0.1 10*3/uL (ref 0.0–0.7)
Eosinophils Relative: 1.3 % (ref 0.0–5.0)
HCT: 45.1 % (ref 39.0–52.0)
Hemoglobin: 14.8 g/dL (ref 13.0–17.0)
Lymphocytes Relative: 20.9 % (ref 12.0–46.0)
Lymphs Abs: 1.2 10*3/uL (ref 0.7–4.0)
MCHC: 32.9 g/dL (ref 30.0–36.0)
MCV: 84.7 fl (ref 78.0–100.0)
Monocytes Absolute: 0.4 10*3/uL (ref 0.1–1.0)
Monocytes Relative: 6.8 % (ref 3.0–12.0)
Neutro Abs: 4.2 10*3/uL (ref 1.4–7.7)
Neutrophils Relative %: 70.5 % (ref 43.0–77.0)
Platelets: 150 10*3/uL (ref 150.0–400.0)
RBC: 5.33 Mil/uL (ref 4.22–5.81)
RDW: 13.8 % (ref 11.5–15.5)
WBC: 5.9 10*3/uL (ref 4.0–10.5)

## 2019-08-03 LAB — COMPREHENSIVE METABOLIC PANEL
ALT: 38 U/L (ref 0–53)
AST: 26 U/L (ref 0–37)
Albumin: 4.4 g/dL (ref 3.5–5.2)
Alkaline Phosphatase: 64 U/L (ref 39–117)
BUN: 16 mg/dL (ref 6–23)
CO2: 26 mEq/L (ref 19–32)
Calcium: 9 mg/dL (ref 8.4–10.5)
Chloride: 105 mEq/L (ref 96–112)
Creatinine, Ser: 0.98 mg/dL (ref 0.40–1.50)
GFR: 80.65 mL/min (ref >60.00)
Glucose, Bld: 108 mg/dL — ABNORMAL HIGH (ref 70–99)
Potassium: 4.5 mEq/L (ref 3.5–5.1)
Sodium: 140 mEq/L (ref 135–145)
Total Bilirubin: 0.4 mg/dL (ref 0.2–1.2)
Total Protein: 6.8 g/dL (ref 6.0–8.3)

## 2019-08-03 LAB — LIPID PANEL
Cholesterol: 162 mg/dL (ref 0–200)
HDL: 37.8 mg/dL — ABNORMAL LOW (ref >39.00)
NonHDL: 124.23
Total CHOL/HDL Ratio: 4
Triglycerides: 203 mg/dL — ABNORMAL HIGH (ref 0.0–149.0)
VLDL: 40.6 mg/dL — ABNORMAL HIGH (ref 0.0–40.0)

## 2019-08-03 LAB — LDL CHOLESTEROL, DIRECT: Direct LDL: 96 mg/dL

## 2019-08-03 LAB — HEMOGLOBIN A1C: Hgb A1c MFr Bld: 9.2 % — ABNORMAL HIGH (ref 4.6–6.5)

## 2019-08-03 NOTE — Progress Notes (Signed)
Subjective:     Omar Travis is a 53 y.o. male and is here for a comprehensive physical exam. The patient reports no problems.  Pt had labs done this am prior to appt so he would not have to fast all day.  Pt states doing well.  FSBS typically in the 150s.  Taking Toujeo 50 units, Invokana 100 mg, Ozempic 1 mg.  Patient endorses bread is his weakness.  States does a lot of walking at work, but not currently exercising.  Overall eating healthy and watching sodium intake but likes sandwiches.  Social History   Socioeconomic History  . Marital status: Married    Spouse name: Not on file  . Number of children: Not on file  . Years of education: Not on file  . Highest education level: Not on file  Occupational History  . Not on file  Social Needs  . Financial resource strain: Not on file  . Food insecurity    Worry: Not on file    Inability: Not on file  . Transportation needs    Medical: Not on file    Non-medical: Not on file  Tobacco Use  . Smoking status: Never Smoker  . Smokeless tobacco: Never Used  Substance and Sexual Activity  . Alcohol use: Yes  . Drug use: Never  . Sexual activity: Yes  Lifestyle  . Physical activity    Days per week: Not on file    Minutes per session: Not on file  . Stress: Not on file  Relationships  . Social Herbalist on phone: Not on file    Gets together: Not on file    Attends religious service: Not on file    Active member of club or organization: Not on file    Attends meetings of clubs or organizations: Not on file    Relationship status: Not on file  . Intimate partner violence    Fear of current or ex partner: Not on file    Emotionally abused: Not on file    Physically abused: Not on file    Forced sexual activity: Not on file  Other Topics Concern  . Not on file  Social History Narrative  . Not on file   Health Maintenance  Topic Date Due  . HEMOGLOBIN A1C  07/29/68  . FOOT EXAM  08/14/1978  . OPHTHALMOLOGY  EXAM  08/14/1978  . HIV Screening  08/15/1983  . TETANUS/TDAP  08/15/1987  . COLONOSCOPY  08/14/2018  . INFLUENZA VACCINE  04/23/2019  . PNEUMOCOCCAL POLYSACCHARIDE VACCINE AGE 19-64 HIGH RISK  Completed    The following portions of the patient's history were reviewed and updated as appropriate: allergies, current medications, past family history, past medical history, past social history, past surgical history and problem list.  Review of Systems Pertinent items noted in HPI and remainder of comprehensive ROS otherwise negative.   Objective:    BP 98/76 (BP Location: Left Arm, Patient Position: Sitting, Cuff Size: Large)   Pulse (!) 102   Temp 97.7 F (36.5 C) (Temporal)   Wt 246 lb (111.6 kg)   SpO2 96%   BMI 31.58 kg/m  General appearance: alert, cooperative and no distress Head: Normocephalic, without obvious abnormality, atraumatic Eyes: conjunctivae/corneas clear. PERRL, EOM's intact. Fundi benign. Ears: normal TM's and external ear canals both ears Nose: Nares normal. Septum midline. Mucosa normal. No drainage or sinus tenderness. Throat: lips, mucosa, and tongue normal; teeth and gums normal Neck: no adenopathy, no  carotid bruit, no JVD, supple, symmetrical, trachea midline and thyroid not enlarged, symmetric, no tenderness/mass/nodules Lungs: clear to auscultation bilaterally Heart: regular rate and rhythm, S1, S2 normal, no murmur, click, rub or gallop Abdomen: soft, non-tender; bowel sounds normal; no masses,  no organomegaly Extremities: extremities normal, atraumatic, no cyanosis or edema Pulses: 2+ and symmetric Skin: Skin color, texture, turgor normal. No rashes or lesions Lymph nodes: Cervical, supraclavicular, and axillary nodes normal. Neurologic: Alert and oriented X 3, normal strength and tone. Normal symmetric reflexes. Normal coordination and gait    Assessment:    Healthy male exam.      Plan:     Anticipatory guidance given including wearing  seatbelts, smoke detectors in the home, increasing physical activity, increasing p.o. intake of water and vegetables. -labs obtained this am prior to visit -Offered influenza vaccine.  Declines at this time -We will schedule Ophthalmology exam -will place referral for colonoscopy---would like to schedule sometime in 2021 -given handout -next CPE in 1 yr See After Visit Summary for Counseling Recommendations    Diabetes mellitus without complication ([place]) -foot exam done this visit -Continue Invokana 300 mg daily, and Ozempic 1 mg. -will increase toujeo to 54 units from 50 units. -On an ACEI -Patient wishes to work on dietary changes. -f/u in 1 month. Encouraged to bring glucometer with him -given handouts - Plan: Hemoglobin A1c 9.2% glucose 108 from labs this a.m.  Hyperlipidemia, unspecified hyperlipidemia type  -Results of lipid panel reviewed trig elevated at 203 and HDL 37 -Discussed lifestyle modifications -Given handout -Continue Crestor 5 mg.  Consider increasing dose to 10 mg - Plan: Lipid Panel  Essential hypertension  -Controlled -Continue lisinopril 5 mg daily -Encouraged to check BP at home -Consider decreasing lisinopril to 2.5 mg - Plan: CMP  Screen for colon cancer  - Plan: Ambulatory referral to Gastroenterology  F/u in 1-2 months for DM  Grier Mitts, MD

## 2019-08-03 NOTE — Patient Instructions (Addendum)
We should increase your Toujeo dose to 54 units daily.  Preventive Care 28-51 Years Old, Male Preventive care refers to lifestyle choices and visits with your health care provider that can promote health and wellness. This includes:  A yearly physical exam. This is also called an annual well check.  Regular dental and eye exams.  Immunizations.  Screening for certain conditions.  Healthy lifestyle choices, such as eating a healthy diet, getting regular exercise, not using drugs or products that contain nicotine and tobacco, and limiting alcohol use. What can I expect for my preventive care visit? Physical exam Your health care provider will check:  Height and weight. These may be used to calculate body mass index (BMI), which is a measurement that tells if you are at a healthy weight.  Heart rate and blood pressure.  Your skin for abnormal spots. Counseling Your health care provider may ask you questions about:  Alcohol, tobacco, and drug use.  Emotional well-being.  Home and relationship well-being.  Sexual activity.  Eating habits.  Work and work Statistician. What immunizations do I need?  Influenza (flu) vaccine  This is recommended every year. Tetanus, diphtheria, and pertussis (Tdap) vaccine  You may need a Td booster every 10 years. Varicella (chickenpox) vaccine  You may need this vaccine if you have not already been vaccinated. Zoster (shingles) vaccine  You may need this after age 92. Measles, mumps, and rubella (MMR) vaccine  You may need at least one dose of MMR if you were born in 1957 or later. You may also need a second dose. Pneumococcal conjugate (PCV13) vaccine  You may need this if you have certain conditions and were not previously vaccinated. Pneumococcal polysaccharide (PPSV23) vaccine  You may need one or two doses if you smoke cigarettes or if you have certain conditions. Meningococcal conjugate (MenACWY) vaccine  You may need this  if you have certain conditions. Hepatitis A vaccine  You may need this if you have certain conditions or if you travel or work in places where you may be exposed to hepatitis A. Hepatitis B vaccine  You may need this if you have certain conditions or if you travel or work in places where you may be exposed to hepatitis B. Haemophilus influenzae type b (Hib) vaccine  You may need this if you have certain risk factors. Human papillomavirus (HPV) vaccine  If recommended by your health care provider, you may need three doses over 6 months. You may receive vaccines as individual doses or as more than one vaccine together in one shot (combination vaccines). Talk with your health care provider about the risks and benefits of combination vaccines. What tests do I need? Blood tests  Lipid and cholesterol levels. These may be checked every 5 years, or more frequently if you are over 70 years old.  Hepatitis C test.  Hepatitis B test. Screening  Lung cancer screening. You may have this screening every year starting at age 25 if you have a 30-pack-year history of smoking and currently smoke or have quit within the past 15 years.  Prostate cancer screening. Recommendations will vary depending on your family history and other risks.  Colorectal cancer screening. All adults should have this screening starting at age 31 and continuing until age 21. Your health care provider may recommend screening at age 51 if you are at increased risk. You will have tests every 1-10 years, depending on your results and the type of screening test.  Diabetes screening. This is done  by checking your blood sugar (glucose) after you have not eaten for a while (fasting). You may have this done every 1-3 years.  Sexually transmitted disease (STD) testing. Follow these instructions at home: Eating and drinking  Eat a diet that includes fresh fruits and vegetables, whole grains, lean protein, and low-fat dairy products.   Take vitamin and mineral supplements as recommended by your health care provider.  Do not drink alcohol if your health care provider tells you not to drink.  If you drink alcohol: ? Limit how much you have to 0-2 drinks a day. ? Be aware of how much alcohol is in your drink. In the [place] one drink equals one 12 oz bottle of beer (355 mL), one 5 oz glass of wine (148 mL), or one 1 oz glass of hard liquor (44 mL). Lifestyle  Take daily care of your teeth and gums.  Stay active. Exercise for at least 30 minutes on 5 or more days each week.  Do not use any products that contain nicotine or tobacco, such as cigarettes, e-cigarettes, and chewing tobacco. If you need help quitting, ask your health care provider.  If you are sexually active, practice safe sex. Use a condom or other form of protection to prevent STIs (sexually transmitted infections).  Talk with your health care provider about taking a low-dose aspirin every day starting at age 54. What's next?  Go to your health care provider once a year for a well check visit.  Ask your health care provider how often you should have your eyes and teeth checked.  Stay up to date on all vaccines. This information is not intended to replace advice given to you by your health care provider. Make sure you discuss any questions you have with your health care provider. Document Released: 10/05/2015 Document Revised: 09/02/2018 Document Reviewed: 09/02/2018 Elsevier Patient Education  2020 [place].  Preventing Diabetes Mellitus Complications You can take action to prevent or slow down problems that are caused by diabetes (diabetes mellitus). Following your diabetes plan and taking care of yourself can reduce your risk of serious or life-threatening complications. What actions can I take to prevent diabetes complications? Manage your diabetes   Follow instructions from your health care providers about managing your diabetes. Your  diabetes may be managed by a team of health care providers who can teach you how to care for yourself and can answer questions that you have.  Educate yourself about your condition so you can make healthy choices about eating and physical activity.  Check your blood sugar (glucose) levels as often as directed. Your health care provider will help you decide how often to check your blood glucose level depending on your treatment goals and how well you are meeting them.  Ask your health care provider if you should take low-dose aspirin daily and what dose is recommended for you. Taking low-dose aspirin daily is recommended to help prevent cardiovascular disease. Do not use nicotine or tobacco Do not use any products that contain nicotine or tobacco, such as cigarettes and e-cigarettes. If you need help quitting, ask your health care provider. Nicotine raises your risk for diabetes problems. If you quit using nicotine:  You will lower your risk for heart attack, stroke, nerve disease, and kidney disease.  Your cholesterol and blood pressure may improve.  Your blood circulation will improve. Keep your blood pressure under control Your personal target blood pressure is determined based on:  Your age.  Your medicines.  How  long you have had diabetes.  Any other medical conditions you have. To control your blood pressure:  Follow instructions from your health care provider about meal planning, exercise, and medicines.  Make sure your health care provider checks your blood pressure at every medical visit.  Monitor your blood pressure at home as told by your health care provider.  Keep your cholesterol under control To control your cholesterol:  Follow instructions from your health care provider about meal planning, exercise, and medicines.  Have your cholesterol checked at least once a year.  You may be prescribed medicine to lower cholesterol (statin). If you are not taking a statin,  ask your health care provider if you should be. Controlling your cholesterol may:  Help prevent heart disease and stroke. These are the most common health problems for people with diabetes.  Improve your blood flow. Schedule and keep yearly physical exams and eye exams Your health care provider will tell you how often you need medical visits depending on your diabetes management plan. Keep all follow-up visits as directed. This is important so possible problems can be identified early and complications can be avoided or treated.  Every visit with your health care provider should include measuring your: ? Weight. ? Blood pressure. ? Blood glucose control.  Your A1c (hemoglobin A1c) level should be checked: ? At least 2 times a year, if you are meeting your treatment goals. ? 4 times a year, if you are not meeting treatment goals or if your treatment goals have changed.  Your blood lipids (lipid profile) should be checked yearly. You should also be checked yearly for protein in your urine (urine microalbumin).  If you have type 1 diabetes, get an eye exam 3-5 years after you are diagnosed, and then once a year after your first exam.  If you have type 2 diabetes, get an eye exam as soon as you are diagnosed, and then once a year after your first exam. Keep your vaccines current It is recommended that you receive:  A flu (influenza) vaccine every year.  A pneumonia (pneumococcal) vaccine and a hepatitis B vaccine. If you are age 79 or older, you may get the pneumonia vaccine as a series of two separate shots. Ask your health care provider which other vaccines may be recommended. Take care of your feet Diabetes may cause you to have poor blood circulation to your legs and feet. Because of this, taking care of your feet is very important. Diabetes can cause:  The skin on the feet to get thinner, break more easily, and heal more slowly.  Nerve damage in your legs and feet, which results  in decreased feeling. You may not notice minor injuries that could lead to serious problems. To avoid foot problems:  Check your skin and feet every day for cuts, bruises, redness, blisters, or sores.  Schedule a foot exam with your health care provider once every year. This exam includes: ? Inspecting of the structure and skin of your feet. ? Checking the pulses and sensation in your feet.  Make sure that your health care provider performs a visual foot exam at every medical visit.  Take care of your teeth People with poorly controlled diabetes are more likely to have gum (periodontal) disease. Diabetes can make periodontal diseases harder to control. If not treated, periodontal diseases can lead to tooth loss. To prevent this:  Brush your teeth twice a day.  Floss at least once a day.  Visit your dentist 2  times a year. Drink responsibly Limit alcohol intake to no more than 1 drink a day for nonpregnant women and 2 drinks a day for men. One drink equals 12 oz of beer, 5 oz of wine, or 1 oz of hard liquor.  It is important to eat food when you drink alcohol to avoid low blood glucose (hypoglycemia). Avoid alcohol if you:  Have a history of alcohol abuse or dependence.  Are pregnant.  Have liver disease, pancreatitis, advanced neuropathy, or severe hypertriglyceridemia. Lessen stress Living with diabetes can be stressful. When you are experiencing stress, your blood glucose may be affected in two ways:  Stress hormones may cause your blood glucose to rise.  You may be distracted from taking good care of yourself. Be aware of your stress level and make changes to help you manage challenging situations. To lower your stress levels:  Consider joining a support group.  Do planned relaxation or meditation.  Do a hobby that you enjoy.  Maintain healthy relationships.  Exercise regularly.  Work with your health care provider or a mental health professional. Summary  You can  take action to prevent or slow down problems that are caused by diabetes (diabetes mellitus). Following your diabetes plan and taking care of yourself can reduce your risk of serious or life-threatening complications.  Follow instructions from your health care providers about managing your diabetes. Your diabetes may be managed by a team of health care providers who can teach you how to care for yourself and can answer questions that you have.  Your health care provider will tell you how often you need medical visits depending on your diabetes management plan. Keep all follow-up visits as directed. This is important so possible problems can be identified early and complications can be avoided or treated. This information is not intended to replace advice given to you by your health care provider. Make sure you discuss any questions you have with your health care provider. Document Released: 05/27/2011 Document Revised: 12/07/2017 Document Reviewed: 06/07/2016 Elsevier Patient Education  [place].  Diabetes Mellitus and Sick Day Management Blood sugar (glucose) can be difficult to control when you are sick. Common illnesses that can cause problems for people with diabetes (diabetes mellitus) include colds, fever, flu (influenza), nausea, vomiting, and diarrhea. These illnesses can cause stress and loss of body fluids (dehydration), and those issues can cause blood glucose levels to increase. Because of this, it is very important to take your insulin and diabetes medicines and eat some form of carbohydrate when you are sick. You should make a plan for days when you are sick (sick day plan) as part of your diabetes management plan. You and your health care provider should make this plan in advance. The following guidelines are intended to help you manage an illness that lasts for about 24 hours or less. Your health care provider may also give you more specific instructions. What do I need to do to  manage my blood glucose?   Check your blood glucose every 2-4 hours, or as often as told by your health care provider.  Know your sick day treatment goals. Your target blood glucose levels may be different when you are sick.  If you use insulin, take your usual dose. ? If your blood glucose continues to be too high, you may need to take an additional insulin dose as told by your health care provider.  If you use oral diabetes medicine, you may need to stop taking it  if you are not able to eat or drink normally. Ask your health care provider about whether you need to stop taking these medicines while you are sick.  If you use injectable hormone medicines other than insulin to control your diabetes, ask your health care provider about whether you need to stop taking these medicines while you are sick. What else can I do to manage my diabetes when I am sick? Check your ketones  If you have type 1 diabetes, check your urine ketones every 4 hours.  If you have type 2 diabetes, check your urine ketones as often as told by your health care provider. Drink fluids  Drink enough fluid to keep your urine clear or pale yellow. This is especially important if you have a fever, vomiting, or diarrhea. Those symptoms can lead to dehydration.  Follow any instructions from your health care provider about beverages to avoid. ? Do not drink alcohol, caffeine, or drinks that contain a lot of sugar. Take medicines as directed  Take-over-the-counter and prescription medicines only as told by your health care provider.  Check medicine labels for added sugars. Some medicines may contain sugar or types of sugars that can raise your blood glucose level. What foods can I eat when I am sick?  You need to eat some form of carbohydrates when you are sick. You should eat 45-50 grams (45-50 g) of carbohydrates every 3-4 hours until you feel better. All of the food choices below contain about 15 g of carbohydrates.  Plan ahead and keep some of these foods around so you have them if you get sick.  4-6 oz (120-177 mL) carbonated beverage that contains sugar, such as regular (not diet) soda. You may be able to drink carbonated beverages more easily if you open the beverage and let it sit at room temperature for a few minutes before drinking.   of a twin frozen ice pop.  4 oz (120 g) regular gelatin.  4 oz (120 mL) fruit juice.  4 oz (120 g) ice cream or frozen yogurt.  2 oz (60 g) sherbet.  8 oz (240 mL) clear broth or soup.  4 oz (120 g) regular custard.  4 oz (120 g) regular pudding.  8 oz (240 g) plain yogurt.  1 slice bread or toast.  6 saltine crackers.  5 vanilla wafers. Questions to ask your health care provider Consider asking the following questions so you know what to do on days when you are sick:  Should I adjust my diabetes medicines?  How often do I need to check my blood glucose?  What supplies do I need to manage my diabetes at home when I am sick?  What number can I call if I have questions?  What foods and drinks should I avoid? Contact a health care provider if:  You develop symptoms of diabetic ketoacidosis, such as: ? Fatigue. ? Weight loss. ? Excessive thirst. ? Light-headedness. ? Fruity or sweet-smelling breath. ? Excessive urination. ? Vision changes. ? Confusion or irritability. ? Nausea. ? Vomiting. ? Rapid breathing. ? Pain in the abdomen. ? Feeling flushed.  You are unable to drink fluids without vomiting.  You have any of the following for more than 6 hours: ? Nausea. ? Vomiting. ? Diarrhea.  Your blood glucose is at or above 240 mg/dL (13.3 mmol/L), even after you take an additional insulin dose.  You have a change in how you think, feel, or act (mental status).  You develop another serious  illness.  You have been sick or have had a fever for 2 days or longer and you are not getting better. Get help right away if:  Your blood  glucose is lower than 54 mg/dL (3.0 mmol/L).  You have difficulty breathing.  You have moderate or high ketone levels in your urine.  You used emergency glucagon to treat low blood glucose. Summary  Blood sugar (glucose) can be difficult to control when you are sick. Common illnesses that can cause problems for people with diabetes (diabetes mellitus) include colds, fever, flu (influenza), nausea, vomiting, and diarrhea.  Illnesses can cause stress and loss of body fluids (dehydration), and those issues can cause blood glucose levels to increase.  Make a plan for days when you are sick (sick day plan) as part of your diabetes management plan. You and your health care provider should make this plan in advance.  It is very important to take your insulin and diabetes medicines and to eat some form of carbohydrate when you are sick.  Contact your health care provider if have problems managing your blood glucose levels when you are sick, or if you have been sick or had a fever for 2 days or longer and are not getting better. This information is not intended to replace advice given to you by your health care provider. Make sure you discuss any questions you have with your health care provider. Document Released: 09/11/2003 Document Revised: 06/06/2016 Document Reviewed: 06/06/2016 Elsevier Patient Education  [place].  High Triglycerides Eating Plan Triglycerides are a type of fat in the blood. High levels of triglycerides can increase your risk of heart disease and stroke. If your triglyceride levels are high, choosing the right foods can help lower your triglycerides and keep your heart healthy. Work with your health care provider or a diet and nutrition specialist (dietitian) to develop an eating plan that is right for you. What are tips for following this plan? General guidelines   Lose weight, if you are overweight. For most people, losing 5-10 lbs (2-5 kg) helps lower  triglyceride levels. A weight-loss plan may include. ? 30 minutes of exercise at least 5 days a week. ? Reducing the amount of calories, sugar, and fat you eat.  Eat a wide variety of fresh fruits, vegetables, and whole grains. These foods are high in fiber.  Eat foods that contain healthy fats, such as fatty fish, nuts, seeds, and olive oil.  Avoid foods that are high in added sugar, added salt (sodium), saturated fat, and trans fat.  Avoid low-fiber, refined carbohydrates such as white bread, crackers, noodles, and white rice.  Avoid foods with partially hydrogenated oils (trans fats), such as fried foods or stick margarine.  Limit alcohol intake to no more than 1 drink a day for nonpregnant women and 2 drinks a day for men. One drink equals 12 oz of beer, 5 oz of wine, or 1 oz of hard liquor. Your health care provider may recommend that you drink less depending on your overall health. Reading food labels  Check food labels for the amount of saturated fat. Choose foods with no or very little saturated fat.  Check food labels for the amount of trans fat. Choose foods with no trans fat.  Check food labels for the amount of cholesterol. Choose foods low in cholesterol. Ask your dietitian how much cholesterol you should have each day.  Check food labels for the amount of sodium. Choose foods with less than 140 milligrams (  mg) per serving. Shopping  Buy dairy products labeled as nonfat (skim) or low-fat (1%).  Avoid buying processed or prepackaged foods. These are often high in added sugar, sodium, and fat. Cooking  Choose healthy fats when cooking, such as olive oil or canola oil.  Cook foods using lower fat methods, such as baking, broiling, boiling, or grilling.  Make your own sauces, dressings, and marinades when possible, instead of buying them. Store-bought sauces, dressings, and marinades are often high in sodium and sugar. Meal planning  Eat more home-cooked food and less  restaurant, buffet, and fast food.  Eat fatty fish at least 2 times each week. Examples of fatty fish include salmon, trout, mackerel, tuna, and herring.  If you eat whole eggs, do not eat more than 3 egg yolks per week. What foods are recommended? The items listed may not be a complete list. Talk with your dietitian about what dietary choices are best for you. Grains Whole wheat or whole grain breads, crackers, cereals, and pasta. Unsweetened oatmeal. Bulgur. Barley. Quinoa. Brown rice. Whole wheat flour tortillas. Vegetables Fresh or frozen vegetables. Low-sodium canned vegetables. Fruits All fresh, canned (in natural juice), or frozen fruits. Meats and other protein foods Skinless chicken or [place]. Ground chicken or [place]. Lean cuts of pork, trimmed of fat. Fish and seafood, especially salmon, trout, and herring. Egg whites. Dried beans, peas, or lentils. Unsalted nuts or seeds. Unsalted canned beans. Natural peanut or almond butter. Dairy Low-fat dairy products. Skim or low-fat (1%) milk. Reduced fat (2%) and low-sodium cheese. Low-fat ricotta cheese. Low-fat cottage cheese. Plain, low-fat yogurt. Fats and oils Tub margarine without trans fats. Light or reduced-fat mayonnaise. Light or reduced-fat salad dressings. Avocado. Safflower, olive, sunflower, soybean, and canola oils. What foods are not recommended? The items listed may not be a complete list. Talk with your dietitian about what dietary choices are best for you. Grains White bread. White (regular) pasta. White rice. Cornbread. Bagels. Pastries. Crackers that contain trans fat. Vegetables Creamed or fried vegetables. Vegetables in a cheese sauce. Fruits Sweetened dried fruit. Canned fruit in syrup. Fruit juice. Meats and other protein foods Fatty cuts of meat. Ribs. Chicken wings. Berniece Salines. Sausage. Bologna. Salami. Chitterlings. Fatback. Hot dogs. Bratwurst. Packaged lunch meats. Dairy Whole or reduced-fat (2%) milk.  Half-and-half. Cream cheese. Full-fat or sweetened yogurt. Full-fat cheese. Nondairy creamers. Whipped toppings. Processed cheese or cheese spreads. Cheese curds. Beverages Alcohol. Sweetened drinks, such as soda, lemonade, fruit drinks, or punches. Fats and oils Butter. Stick margarine. Lard. Shortening. Ghee. Bacon fat. Tropical oils, such as coconut, palm kernel, or palm oils. Sweets and desserts Corn syrup. Sugars. Honey. Molasses. Candy. Jam and jelly. Syrup. Sweetened cereals. Cookies. Pies. Cakes. Donuts. Muffins. Ice cream. Condiments Store-bought sauces, dressings, and marinades that are high in sugar, such as ketchup and barbecue sauce. Summary  High levels of triglycerides can increase the risk of heart disease and stroke. Choosing the right foods can help lower your triglycerides.  Eat plenty of fresh fruits, vegetables, and whole grains. Choose low-fat dairy and lean meats. Eat fatty fish at least twice a week.  Avoid processed and prepackaged foods with added sugar, sodium, saturated fat, and trans fat.  If you need suggestions or have questions about what types of food are good for you, talk with your health care provider or a dietitian. This information is not intended to replace advice given to you by your health care provider. Make sure you discuss any questions you have with your health  care provider. Document Released: 06/26/2004 Document Revised: 08/21/2017 Document Reviewed: 11/11/2016 Elsevier Patient Education  2020 [employer].

## 2019-09-14 ENCOUNTER — Other Ambulatory Visit: Payer: Self-pay | Admitting: Family Medicine

## 2019-09-14 MED ORDER — ESCITALOPRAM OXALATE 10 MG PO TABS: 10.0000 mg | ORAL_TABLET | Freq: Every day | ORAL | 0 refills | Status: DC

## 2019-09-14 NOTE — Telephone Encounter (Signed)
Please advise 

## 2019-09-14 NOTE — Telephone Encounter (Signed)
Medication Refill - Medication: escitalopram (LEXAPRO) 10 MG tablet    Preferred Pharmacy (with phone number or street name):  [place], [place] Phone:  [phone]  Fax:  [phone]       Agent: Please be advised that RX refills may take up to 3 business days. We ask that you follow-up with your pharmacy.

## 2019-09-14 NOTE — Telephone Encounter (Signed)
Requested medication (s) are due for refill today: yes  Requested medication (s) are on the active medication list: yes  Last refill: 06/22/2019  Future visit scheduled: no  Notes to clinic:  historical provider    Requested Prescriptions  Pending Prescriptions Disp Refills   escitalopram (LEXAPRO) 10 MG tablet       Sig: Take 1 tablet (10 mg total) by mouth daily.      Psychiatry:  Antidepressants - SSRI Passed - 09/14/2019 11:17 AM      Passed - Valid encounter within last 6 months    Recent Outpatient Visits           1 month ago Well adult exam   Therapist, music at [place], MD   2 months ago Essential hypertension   Therapist, music at [facility], [place]y Adie, MD

## 2019-09-22 ENCOUNTER — Other Ambulatory Visit: Payer: Self-pay | Admitting: Family Medicine

## 2019-10-05 DIAGNOSIS — Z1159 Encounter for screening for other viral diseases: Secondary | ICD-10-CM | POA: Diagnosis not present

## 2019-10-10 DIAGNOSIS — K635 Polyp of colon: Secondary | ICD-10-CM | POA: Diagnosis not present

## 2019-10-10 DIAGNOSIS — Z1211 Encounter for screening for malignant neoplasm of colon: Secondary | ICD-10-CM | POA: Diagnosis not present

## 2019-10-17 ENCOUNTER — Other Ambulatory Visit: Payer: Self-pay | Admitting: Family Medicine

## 2019-11-04 ENCOUNTER — Other Ambulatory Visit: Payer: Self-pay | Admitting: Family Medicine

## 2019-11-23 ENCOUNTER — Other Ambulatory Visit: Payer: Self-pay | Admitting: Family Medicine

## 2019-11-24 ENCOUNTER — Encounter: Payer: Self-pay | Admitting: Family Medicine

## 2019-11-24 DIAGNOSIS — F339 Major depressive disorder, recurrent, unspecified: Secondary | ICD-10-CM | POA: Insufficient documentation

## 2019-11-24 NOTE — Telephone Encounter (Signed)
Pt LOV was 08/03/2019 and last refill was done on 09/14/2019 for 90 tablets. Please advise if ok to refill

## 2019-12-07 DIAGNOSIS — I10 Essential (primary) hypertension: Secondary | ICD-10-CM | POA: Diagnosis not present

## 2019-12-07 DIAGNOSIS — Z794 Long term (current) use of insulin: Secondary | ICD-10-CM | POA: Diagnosis not present

## 2019-12-07 DIAGNOSIS — E1169 Type 2 diabetes mellitus with other specified complication: Secondary | ICD-10-CM | POA: Diagnosis not present

## 2019-12-07 DIAGNOSIS — E785 Hyperlipidemia, unspecified: Secondary | ICD-10-CM | POA: Diagnosis not present

## 2019-12-07 DIAGNOSIS — E1165 Type 2 diabetes mellitus with hyperglycemia: Secondary | ICD-10-CM | POA: Diagnosis not present

## 2019-12-07 DIAGNOSIS — E1159 Type 2 diabetes mellitus with other circulatory complications: Secondary | ICD-10-CM | POA: Diagnosis not present

## 2019-12-14 DIAGNOSIS — Z6831 Body mass index (BMI) 31.0-31.9, adult: Secondary | ICD-10-CM | POA: Diagnosis not present

## 2019-12-14 DIAGNOSIS — R509 Fever, unspecified: Secondary | ICD-10-CM | POA: Diagnosis not present

## 2019-12-14 DIAGNOSIS — J0100 Acute maxillary sinusitis, unspecified: Secondary | ICD-10-CM | POA: Diagnosis not present

## 2019-12-14 DIAGNOSIS — M2550 Pain in unspecified joint: Secondary | ICD-10-CM | POA: Diagnosis not present

## 2020-01-13 ENCOUNTER — Other Ambulatory Visit: Payer: Self-pay

## 2020-01-16 ENCOUNTER — Other Ambulatory Visit: Payer: Self-pay

## 2020-01-16 ENCOUNTER — Encounter: Payer: Self-pay | Admitting: Family Medicine

## 2020-01-16 ENCOUNTER — Ambulatory Visit: Payer: BC Managed Care – PPO | Admitting: Family Medicine

## 2020-01-16 VITALS — BP 104/76 | HR 108 | Temp 98.4°F | Wt 243.0 lb

## 2020-01-16 DIAGNOSIS — S161XXA Strain of muscle, fascia and tendon at neck level, initial encounter: Secondary | ICD-10-CM

## 2020-01-16 DIAGNOSIS — M542 Cervicalgia: Secondary | ICD-10-CM

## 2020-01-16 DIAGNOSIS — E119 Type 2 diabetes mellitus without complications: Secondary | ICD-10-CM

## 2020-01-16 MED ORDER — TOUJEO SOLOSTAR 300 UNIT/ML ~~LOC~~ SOPN: 54.0000 [IU] | PEN_INJECTOR | Freq: Every day | SUBCUTANEOUS | 6 refills | Status: DC

## 2020-01-16 MED ORDER — MELOXICAM 7.5 MG PO TABS: 7.5000 mg | ORAL_TABLET | Freq: Every day | ORAL | 0 refills | Status: DC

## 2020-01-16 MED ORDER — TIZANIDINE HCL 2 MG PO CAPS: 2.0000 mg | ORAL_CAPSULE | Freq: Every evening | ORAL | 0 refills | Status: DC | PRN

## 2020-01-16 MED ORDER — CYCLOBENZAPRINE HCL 5 MG PO TABS: 5.0000 mg | ORAL_TABLET | Freq: Every evening | ORAL | 0 refills | Status: DC | PRN

## 2020-01-16 NOTE — Progress Notes (Signed)
Subjective:    Patient ID: Omar Travis, male    DOB: 05-May-1966, 54 y.o.   MRN: [id number]  No chief complaint on file.   HPI Patient was seen today for acute concern.  Pt notes R sided neck pain x 1 wk.  Started while on vacation in [place].  Pt thinks he slept wrong.  Tried Ibuprofen for symptoms.  Notes has to do a lot of looking up at work which does not help the feeling in his neck.  Denies fever, chills, HAs.    Pt requesting his rx for toujeo be adjusted as he is taking 54 units daily, but running out of meds early.  States got A1C down to 7%.  Past Medical History:  Diagnosis Date  . Diabetes ([place])   . GERD (gastroesophageal reflux disease)   . HTN (hypertension)   . Hyperlipidemia     No Known Allergies  ROS General: Denies fever, chills, night sweats, changes in weight, changes in appetite HEENT: Denies headaches, ear pain, changes in vision, rhinorrhea, sore throat CV: Denies CP, palpitations, SOB, orthopnea Pulm: Denies SOB, cough, wheezing GI: Denies abdominal pain, nausea, vomiting, diarrhea, constipation GU: Denies dysuria, hematuria, frequency, vaginal discharge Msk: Denies muscle cramps, joint pains  +neck pain Neuro: Denies weakness, numbness, tingling Skin: Denies rashes, bruising Psych: Denies depression, anxiety, hallucinations     Objective:    Blood pressure 104/76, pulse (!) 108, temperature 98.4 F (36.9 C), temperature source Temporal, weight 243 lb (110.2 kg), SpO2 96 %.   Gen. Pleasant, well-nourished, in no distress, normal affect   HEENT: [place]/AT, face symmetric, no scleral icterus, PERRLA, EOMI, nares patent without drainage Lungs: no accessory muscle use Cardiovascular: RRR, no peripheral edema Musculoskeletal: No TTP of cervical, thoracic, or lumbar spine.  TTP and tension in R of occipital protuberance and along R sternocleidomastoid muscle. No deformities, no cyanosis or clubbing, normal tone Neuro:  A&Ox3, CN II-XII intact, normal  gait Skin:  Warm, no lesions/ rash   Wt Readings from Last 3 Encounters:  01/16/20 243 lb (110.2 kg)  08/03/19 246 lb (111.6 kg)  06/22/19 239 lb (108.4 kg)    Lab Results  Component Value Date   WBC 5.9 08/03/2019   HGB 14.8 08/03/2019   HCT 45.1 08/03/2019   PLT 150.0 08/03/2019   GLUCOSE 108 (H) 08/03/2019   CHOL 162 08/03/2019   TRIG 203.0 (H) 08/03/2019   HDL 37.80 (L) 08/03/2019   LDLDIRECT 96.0 08/03/2019   ALT 38 08/03/2019   AST 26 08/03/2019   NA 140 08/03/2019   K 4.5 08/03/2019   CL 105 08/03/2019   CREATININE 0.98 08/03/2019   BUN 16 08/03/2019   CO2 26 08/03/2019   HGBA1C 9.2 (H) 08/03/2019    Assessment/Plan:  Neck pain  -likely 2/2 muscle strain -supportive care: heat, stretching, massage, NSAIDs, etc. - Plan: meloxicam (MOBIC) 7.5 MG tablet, DISCONTINUED: cyclobenzaprine (FLEXERIL) 5 MG tablet  Strain of sternocleidomastoid muscle, initial encounter  -supportive care - Plan: meloxicam (MOBIC) 7.5 MG tablet, tizanidine (ZANAFLEX) 2 MG capsule  Diabetes mellitus without complication ([place])  -continue f/u with Endo -continue current meds -refilled toujeo - Plan: insulin glargine, 1 Unit Dial, (TOUJEO SOLOSTAR) 300 UNIT/ML Solostar Pen  F/u prn  Grier Mitts, MD

## 2020-01-16 NOTE — Patient Instructions (Signed)
Cervical Strain and Sprain Rehab Ask your health care provider which exercises are safe for you. Do exercises exactly as told by your health care provider and adjust them as directed. It is normal to feel mild stretching, pulling, tightness, or discomfort as you do these exercises. Stop right away if you feel sudden pain or your pain gets worse. Do not begin these exercises until told by your health care provider. Stretching and range-of-motion exercises Cervical side bending  1. Using good posture, sit on a stable chair or stand up. 2. Without moving your shoulders, slowly tilt your left / right ear to your shoulder until you feel a stretch in the opposite side neck muscles. You should be looking straight ahead. 3. Hold for __________ seconds. 4. Repeat with the other side of your neck. Repeat __________ times. Complete this exercise __________ times a day. Cervical rotation  1. Using good posture, sit on a stable chair or stand up. 2. Slowly turn your head to the side as if you are looking over your left / right shoulder. ? Keep your eyes level with the ground. ? Stop when you feel a stretch along the side and the back of your neck. 3. Hold for __________ seconds. 4. Repeat this by turning to your other side. Repeat __________ times. Complete this exercise __________ times a day. Thoracic extension and pectoral stretch 1. Roll a towel or a small blanket so it is about 4 inches (10 cm) in diameter. 2. Lie down on your back on a firm surface. 3. Put the towel lengthwise, under your spine in the middle of your back. It should not be under your shoulder blades. The towel should line up with your spine from your middle back to your lower back. 4. Put your hands behind your head and let your elbows fall out to your sides. 5. Hold for __________ seconds. Repeat __________ times. Complete this exercise __________ times a day. Strengthening exercises Isometric upper cervical flexion 1. Lie on  your back with a thin pillow behind your head and a small rolled-up towel under your neck. 2. Gently tuck your chin toward your chest and nod your head down to look toward your feet. Do not lift your head off the pillow. 3. Hold for __________ seconds. 4. Release the tension slowly. Relax your neck muscles completely before you repeat this exercise. Repeat __________ times. Complete this exercise __________ times a day. Isometric cervical extension  1. Stand about 6 inches (15 cm) away from a wall, with your back facing the wall. 2. Place a soft object, about 6-8 inches (15-20 cm) in diameter, between the back of your head and the wall. A soft object could be a small pillow, a ball, or a folded towel. 3. Gently tilt your head back and press into the soft object. Keep your jaw and forehead relaxed. 4. Hold for __________ seconds. 5. Release the tension slowly. Relax your neck muscles completely before you repeat this exercise. Repeat __________ times. Complete this exercise __________ times a day. Posture and body mechanics Body mechanics refers to the movements and positions of your body while you do your daily activities. Posture is part of body mechanics. Good posture and healthy body mechanics can help to relieve stress in your body's tissues and joints. Good posture means that your spine is in its natural S-curve position (your spine is neutral), your shoulders are pulled back slightly, and your head is not tipped forward. The following are general guidelines for applying improved   posture and body mechanics to your everyday activities. Sitting  1. When sitting, keep your spine neutral and keep your feet flat on the floor. Use a footrest, if necessary, and keep your thighs parallel to the floor. Avoid rounding your shoulders, and avoid tilting your head forward. 2. When working at a desk or a computer, keep your desk at a height where your hands are slightly lower than your elbows. Slide your  chair under your desk so you are close enough to maintain good posture. 3. When working at a computer, place your monitor at a height where you are looking straight ahead and you do not have to tilt your head forward or downward to look at the screen. Standing   When standing, keep your spine neutral and keep your feet about hip-width apart. Keep a slight bend in your knees. Your ears, shoulders, and hips should line up.  When you do a task in which you stand in one place for a long time, place one foot up on a stable object that is 2-4 inches (5-10 cm) high, such as a footstool. This helps keep your spine neutral. Resting When lying down and resting, avoid positions that are most painful for you. Try to support your neck in a neutral position. You can use a contour pillow or a small rolled-up towel. Your pillow should support your neck but not push on it. This information is not intended to replace advice given to you by your health care provider. Make sure you discuss any questions you have with your health care provider. Document Revised: 12/29/2018 Document Reviewed: 06/09/2018 Elsevier Patient Education  [place].  Musculoskeletal Pain Musculoskeletal pain refers to aches and pains in your bones, joints, muscles, and the tissues that surround them. This pain can occur in any part of the body. It can last for a short time (acute) or a long time (chronic). A physical exam, lab tests, and imaging studies may be done to find the cause of your musculoskeletal pain. Follow these instructions at home:  Lifestyle  Try to control or lower your stress levels. Stress increases muscle tension and can worsen musculoskeletal pain. It is important to recognize when you are anxious or stressed and learn ways to manage it. This may include: ? Meditation or yoga. ? Cognitive or behavioral therapy. ? Acupuncture or massage therapy.  You may continue all activities unless the activities cause  more pain. When the pain gets better, slowly resume your normal activities. Gradually increase the intensity and duration of your activities or exercise. Managing pain, stiffness, and swelling  Take over-the-counter and prescription medicines only as told by your health care provider.  When your pain is severe, bed rest may be helpful. Lie or sit in any position that is comfortable, but get out of bed and walk around at least every couple of hours.  If directed, apply heat to the affected area as often as told by your health care provider. Use the heat source that your health care provider recommends, such as a moist heat pack or a heating pad. ? Place a towel between your skin and the heat source. ? Leave the heat on for 20-30 minutes. ? Remove the heat if your skin turns bright red. This is especially important if you are unable to feel pain, heat, or cold. You may have a greater risk of getting burned.  If directed, put ice on the painful area. ? Put ice in a plastic bag. ? Place  a towel between your skin and the bag. ? Leave the ice on for 20 minutes, 2-3 times a day. General instructions  Your health care provider may recommend that you see a physical therapist. This person can help you come up with a safe exercise program. Do any exercises as told by your physical therapist.  Keep all follow-up visits, including any physical therapy visits, as told by your health care providers. This is important. Contact a health care provider if:  Your pain gets worse.  Medicines do not help ease your pain.  You cannot use the part of your body that hurts, such as your arm, leg, or neck.  You have trouble sleeping.  You have trouble doing your normal activities. Get help right away if:  You have a new injury and your pain is worse or different.  You feel numb or you have tingling in the painful area. Summary  Musculoskeletal pain refers to aches and pains in your bones, joints, muscles,  and the tissues that surround them.  This pain can occur in any part of the body.  Your health care provider may recommend that you see a physical therapist. This person can help you come up with a safe exercise program. Do any exercises as told by your physical therapist.  Lower your stress level. Stress can worsen musculoskeletal pain. Ways to lower stress may include meditation, yoga, cognitive or behavioral therapy, acupuncture, and massage therapy. This information is not intended to replace advice given to you by your health care provider. Make sure you discuss any questions you have with your health care provider. Document Revised: 08/21/2017 Document Reviewed: 10/08/2016 Elsevier Patient Education  2020 [employer].

## 2020-03-03 ENCOUNTER — Other Ambulatory Visit: Payer: Self-pay | Admitting: Family Medicine

## 2020-03-05 NOTE — Telephone Encounter (Signed)
Okay for refill? Receiving a high drug interaction

## 2020-04-06 ENCOUNTER — Other Ambulatory Visit: Payer: Self-pay | Admitting: Family Medicine

## 2020-06-08 ENCOUNTER — Other Ambulatory Visit: Payer: Self-pay | Admitting: Family Medicine

## 2020-06-08 NOTE — Telephone Encounter (Signed)
Spoke with pt aware that Dr Volanda Napoleon is not in the office until 06/20/2020. Pt advise to schedule a virtual visit for med renewal, state that he will have his wife call the office and schedule appointment

## 2020-06-25 ENCOUNTER — Ambulatory Visit (INDEPENDENT_AMBULATORY_CARE_PROVIDER_SITE_OTHER): Payer: BC Managed Care – PPO | Admitting: Family Medicine

## 2020-06-25 ENCOUNTER — Encounter: Payer: Self-pay | Admitting: Family Medicine

## 2020-06-25 ENCOUNTER — Other Ambulatory Visit: Payer: Self-pay

## 2020-06-25 VITALS — BP 126/84 | HR 96 | Temp 98.1°F | Wt 249.0 lb

## 2020-06-25 DIAGNOSIS — E782 Mixed hyperlipidemia: Secondary | ICD-10-CM | POA: Diagnosis not present

## 2020-06-25 DIAGNOSIS — K219 Gastro-esophageal reflux disease without esophagitis: Secondary | ICD-10-CM

## 2020-06-25 DIAGNOSIS — E1169 Type 2 diabetes mellitus with other specified complication: Secondary | ICD-10-CM

## 2020-06-25 DIAGNOSIS — Z23 Encounter for immunization: Secondary | ICD-10-CM | POA: Diagnosis not present

## 2020-06-25 DIAGNOSIS — Z794 Long term (current) use of insulin: Secondary | ICD-10-CM | POA: Diagnosis not present

## 2020-06-25 DIAGNOSIS — I10 Essential (primary) hypertension: Secondary | ICD-10-CM | POA: Diagnosis not present

## 2020-06-25 DIAGNOSIS — F325 Major depressive disorder, single episode, in full remission: Secondary | ICD-10-CM

## 2020-06-25 LAB — POCT GLYCOSYLATED HEMOGLOBIN (HGB A1C): Hemoglobin A1C: 8 % — AB (ref 4.0–5.6)

## 2020-06-25 MED ORDER — LISINOPRIL 5 MG PO TABS: ORAL_TABLET | ORAL | 3 refills | Status: DC

## 2020-06-25 MED ORDER — OMEPRAZOLE 20 MG PO CPDR: 20.0000 mg | DELAYED_RELEASE_CAPSULE | Freq: Every day | ORAL | 3 refills | Status: DC

## 2020-06-25 NOTE — Addendum Note (Signed)
Addended by: Wyvonne Lenz on: 06/25/2020 09:55 AM   Modules accepted: Orders

## 2020-06-25 NOTE — Progress Notes (Signed)
Subjective:    Patient ID: Omar Travis, male    DOB: [birth year], 54 y.o.   MRN: [id number]  No chief complaint on file.   HPI Patient was seen today for f/u.  Patient states blood sugar has been "so-so".  Not checking every day.  Taking insulin glargine 54 units daily, glipizide XL 10 mg, Invokana 300 mg, Ozempic 0.25 mg subcu weekly.  States BP seems to be okay.  Needs refill on lisinopril.  Taking Crestor fenofibrate for cholesterol.  Denies myalgias.  Also needs refills on Prilosec.  Pt thinks he is taking Lexapro 10 mg nightly.  Per chart review should be out of medication.  Taking lisinopril 5 mg for blood pressure.  Plans to start working out at the gym 3 days/week.  Doing some extra walking for exercise when playing golf.  Past Medical History:  Diagnosis Date  . Diabetes ([place])   . GERD (gastroesophageal reflux disease)   . HTN (hypertension)   . Hyperlipidemia     Allergies  Allergen Reactions  . Flexeril [Cyclobenzaprine]     Causes "breakout"    ROS General: Denies fever, chills, night sweats, changes in weight, changes in appetite HEENT: Denies headaches, ear pain, changes in vision, rhinorrhea, sore throat CV: Denies CP, palpitations, SOB, orthopnea Pulm: Denies SOB, cough, wheezing GI: Denies abdominal pain, nausea, vomiting, diarrhea, constipation GU: Denies dysuria, hematuria, frequency, vaginal discharge Msk: Denies muscle cramps, joint pains Neuro: Denies weakness, numbness, tingling Skin: Denies rashes, bruising Psych: Denies depression, anxiety, hallucinations    Objective:    Blood pressure 126/84, pulse 96, temperature 98.1 F (36.7 C), temperature source Oral, weight 249 lb (112.9 kg), SpO2 99 %.   Gen. Pleasant, well-nourished, in no distress, normal affect  HEENT: [name]/AT, face symmetric, conjunctiva clear, no scleral icterus, PERRLA, EOMI, nares patent without drainage Lungs: no accessory muscle use Cardiovascular: RRR, no peripheral  edema Musculoskeletal: No deformities, no cyanosis or clubbing, normal tone Neuro:  A&Ox3, CN II-XII intact, normal gait Skin:  Warm, no lesions/ rash   Wt Readings from Last 3 Encounters:  06/25/20 249 lb (112.9 kg)  01/16/20 243 lb (110.2 kg)  08/03/19 246 lb (111.6 kg)    Lab Results  Component Value Date   WBC 5.9 08/03/2019   HGB 14.8 08/03/2019   HCT 45.1 08/03/2019   PLT 150.0 08/03/2019   GLUCOSE 108 (H) 08/03/2019   CHOL 162 08/03/2019   TRIG 203.0 (H) 08/03/2019   HDL 37.80 (L) 08/03/2019   LDLDIRECT 96.0 08/03/2019   ALT 38 08/03/2019   AST 26 08/03/2019   NA 140 08/03/2019   K 4.5 08/03/2019   CL 105 08/03/2019   CREATININE 0.98 08/03/2019   BUN 16 08/03/2019   CO2 26 08/03/2019   HGBA1C 9.2 (H) 08/03/2019    Assessment/Plan:  Essential hypertension -Controlled -Continue current medications including lisinopril 5 mg -Patient encouraged to check BP at home -Discussed lifestyle modifications  Mixed hyperlipidemia -Continue lifestyle modifications -Continue Crestor 5 mg and fenofibrate 145 mg -We will recheck cholesterol at next OFV   Type 2 diabetes mellitus with other specified complication, with long-term current use of insulin ([facility]) -Improving -Hemoglobin A1c 8%.  Was 9.2% on 08/03/2019 -Continue Invokana 300 mg daily, insulin glargine 54 units daily, Ozempic 1 mg subcu weekly. -Continue ACEI -Continue checking FSBS regularly -Continue lifestyle modifications -Patient to schedule eye exam - Plan: POC HgB A1c  MDD in remission, unspecified whether recurrent -Improved -PHQ-9 score 5 -Will d/c Lexapro 10 mg. -  will monitor  Need for influenza vaccination -Influenza vaccine given this visit  GERD, unspecified whether esophagitis present -Continue to avoid foods known to cause problems -Continue Prilosec 20 mg daily  F/u in 1 month for CPE  Grier Mitts, MD

## 2020-06-25 NOTE — Patient Instructions (Addendum)
You can stop taking Lexapro 10 mg daily.  We will see how you do off of the medication.  Your hemoglobin A1c is 8% this visit.  You should continue to work to decrease the amount of sweets and carbohydrates you are eating as well as increase your physical activities to lose weight.  Refills for Prilosec and lisinopril were sent to your pharmacy.  We will have you follow-up in the next few months for your physical.   Diabetes Mellitus and Standards of Medical Care Managing diabetes (diabetes mellitus) can be complicated. Your diabetes treatment may be managed by a team of health care providers, including:  A physician who specializes in diabetes (endocrinologist).  A nurse practitioner or physician assistant.  Nurses.  A diet and nutrition specialist (registered dietitian).  A certified diabetes educator (CDE).  An exercise specialist.  A pharmacist.  An eye doctor.  A foot specialist (podiatrist).  A dentist.  A primary care provider.  A mental health provider. Your health care providers follow guidelines to help you get the best quality of care. The following schedule is a general guideline for your diabetes management plan. Your health care providers may give you more specific instructions. Physical exams Upon being diagnosed with diabetes mellitus, and each year after that, your health care provider will ask about your medical and family history. He or she will also do a physical exam. Your exam may include:  Measuring your height, weight, and body mass index (BMI).  Checking your blood pressure. This will be done at every routine medical visit. Your target blood pressure may vary depending on your medical conditions, your age, and other factors.  Thyroid gland exam.  Skin exam.  Screening for damage to your nerves (peripheral neuropathy). This may include checking the pulse in your legs and feet and checking the level of sensation in your hands and feet.  A  complete foot exam to inspect the structure and skin of your feet, including checking for cuts, bruises, redness, blisters, sores, or other problems.  Screening for blood vessel (vascular) problems, which may include checking the pulse in your legs and feet and checking your temperature. Blood tests Depending on your treatment plan and your personal needs, you may have the following tests done:  HbA1c (hemoglobin A1c). This test provides information about blood sugar (glucose) control over the previous 2-3 months. It is used to adjust your treatment plan, if needed. This test will be done: ? At least 2 times a year, if you are meeting your treatment goals. ? 4 times a year, if you are not meeting your treatment goals or if treatment goals have changed.  Lipid testing, including total, LDL, and HDL cholesterol and triglyceride levels. ? The goal for LDL is less than 100 mg/dL (5.5 mmol/L). If you are at high risk for complications, the goal is less than 70 mg/dL (3.9 mmol/L). ? The goal for HDL is 40 mg/dL (2.2 mmol/L) or higher for men and 50 mg/dL (2.8 mmol/L) or higher for women. An HDL cholesterol of 60 mg/dL (3.3 mmol/L) or higher gives some protection against heart disease. ? The goal for triglycerides is less than 150 mg/dL (8.3 mmol/L).  Liver function tests.  Kidney function tests.  Thyroid function tests. Dental and eye exams  Visit your dentist two times a year.  If you have type 1 diabetes, your health care provider may recommend an eye exam 3-5 years after you are diagnosed, and then once a year after your first  exam. ? For children with type 1 diabetes, a health care provider may recommend an eye exam when your child is age 49 or older and has had diabetes for 3-5 years. After the first exam, your child should get an eye exam once a year.  If you have type 2 diabetes, your health care provider may recommend an eye exam as soon as you are diagnosed, and then once a year after  your first exam. Immunizations   The yearly flu (influenza) vaccine is recommended for everyone 6 months or older who has diabetes.  The pneumonia (pneumococcal) vaccine is recommended for everyone 2 years or older who has diabetes. If you are 62 or older, you may get the pneumonia vaccine as a series of two separate shots.  The hepatitis B vaccine is recommended for adults shortly after being diagnosed with diabetes.  Adults and children with diabetes should receive all other vaccines according to age-specific recommendations from the Centers for Disease Control and Prevention (CDC). Mental and emotional health Screening for symptoms of eating disorders, anxiety, and depression is recommended at the time of diagnosis and afterward as needed. If your screening shows that you have symptoms (positive screening result), you may need more evaluation and you may work with a mental health care provider. Treatment plan Your treatment plan will be reviewed at every medical visit. You and your health care provider will discuss:  How you are taking your medicines, including insulin.  Any side effects you are experiencing.  Your blood glucose target goals.  The frequency of your blood glucose monitoring.  Lifestyle habits, such as activity level as well as tobacco, alcohol, and substance use. Diabetes self-management education Your health care provider will assess how well you are monitoring your blood glucose levels and whether you are taking your insulin correctly. He or she may refer you to:  A certified diabetes educator to manage your diabetes throughout your life, starting at diagnosis.  A registered dietitian who can create or review your personal nutrition plan.  An exercise specialist who can discuss your activity level and exercise plan. Summary  Managing diabetes (diabetes mellitus) can be complicated. Your diabetes treatment may be managed by a team of health care  providers.  Your health care providers follow guidelines in order to help you get the best quality of care.  Standards of care including having regular physical exams, blood tests, blood pressure monitoring, immunizations, screening tests, and education about how to manage your diabetes.  Your health care providers may also give you more specific instructions based on your individual health. This information is not intended to replace advice given to you by your health care provider. Make sure you discuss any questions you have with your health care provider. Document Revised: 05/28/2018 Document Reviewed: 06/06/2016 Elsevier Patient Education  [place].  Diabetes Mellitus and Exercise Exercising regularly is important for your overall health, especially when you have diabetes (diabetes mellitus). Exercising is not only about losing weight. It has many other health benefits, such as increasing muscle strength and bone density and reducing body fat and stress. This leads to improved fitness, flexibility, and endurance, all of which result in better overall health. Exercise has additional benefits for people with diabetes, including:  Reducing appetite.  Helping to lower and control blood glucose.  Lowering blood pressure.  Helping to control amounts of fatty substances (lipids) in the blood, such as cholesterol and triglycerides.  Helping the body to respond better to insulin (improving insulin  sensitivity).  Reducing how much insulin the body needs.  Decreasing the risk for heart disease by: ? Lowering cholesterol and triglyceride levels. ? Increasing the levels of good cholesterol. ? Lowering blood glucose levels. What is my activity plan? Your health care provider or certified diabetes educator can help you make a plan for the type and frequency of exercise (activity plan) that works for you. Make sure that you:  Do at least 150 minutes of moderate-intensity or  vigorous-intensity exercise each week. This could be brisk walking, biking, or water aerobics. ? Do stretching and strength exercises, such as yoga or weightlifting, at least 2 times a week. ? Spread out your activity over at least 3 days of the week.  Get some form of physical activity every day. ? Do not go more than 2 days in a row without some kind of physical activity. ? Avoid being inactive for more than 30 minutes at a time. Take frequent breaks to walk or stretch.  Choose a type of exercise or activity that you enjoy, and set realistic goals.  Start slowly, and gradually increase the intensity of your exercise over time. What do I need to know about managing my diabetes?   Check your blood glucose before and after exercising. ? If your blood glucose is 240 mg/dL (13.3 mmol/L) or higher before you exercise, check your urine for ketones. If you have ketones in your urine, do not exercise until your blood glucose returns to normal. ? If your blood glucose is 100 mg/dL (5.6 mmol/L) or lower, eat a snack containing 15-20 grams of carbohydrate. Check your blood glucose 15 minutes after the snack to make sure that your level is above 100 mg/dL (5.6 mmol/L) before you start your exercise.  Know the symptoms of low blood glucose (hypoglycemia) and how to treat it. Your risk for hypoglycemia increases during and after exercise. Common symptoms of hypoglycemia can include: ? Hunger. ? Anxiety. ? Sweating and feeling clammy. ? Confusion. ? Dizziness or feeling light-headed. ? Increased heart rate or palpitations. ? Blurry vision. ? Tingling or numbness around the mouth, lips, or tongue. ? Tremors or shakes. ? Irritability.  Keep a rapid-acting carbohydrate snack available before, during, and after exercise to help prevent or treat hypoglycemia.  Avoid injecting insulin into areas of the body that are going to be exercised. For example, avoid injecting insulin into: ? The arms, when  playing tennis. ? The legs, when jogging.  Keep records of your exercise habits. Doing this can help you and your health care provider adjust your diabetes management plan as needed. Write down: ? Food that you eat before and after you exercise. ? Blood glucose levels before and after you exercise. ? The type and amount of exercise you have done. ? When your insulin is expected to peak, if you use insulin. Avoid exercising at times when your insulin is peaking.  When you start a new exercise or activity, work with your health care provider to make sure the activity is safe for you, and to adjust your insulin, medicines, or food intake as needed.  Drink plenty of water while you exercise to prevent dehydration or heat stroke. Drink enough fluid to keep your urine clear or pale yellow. Summary  Exercising regularly is important for your overall health, especially when you have diabetes (diabetes mellitus).  Exercising has many health benefits, such as increasing muscle strength and bone density and reducing body fat and stress.  Your health care provider or  certified diabetes educator can help you make a plan for the type and frequency of exercise (activity plan) that works for you.  When you start a new exercise or activity, work with your health care provider to make sure the activity is safe for you, and to adjust your insulin, medicines, or food intake as needed. This information is not intended to replace advice given to you by your health care provider. Make sure you discuss any questions you have with your health care provider. Document Revised: 04/02/2017 Document Reviewed: 02/18/2016 Elsevier Patient Education  2020 [place] for Gastroesophageal Reflux Disease, Adult When you have gastroesophageal reflux disease (GERD), the foods you eat and your eating habits are very important. Choosing the right foods can help ease your discomfort. Think about working with a  nutrition specialist (dietitian) to help you make good choices. What are tips for following this plan?  Meals  Choose healthy foods that are low in fat, such as fruits, vegetables, whole grains, low-fat dairy products, and lean meat, fish, and poultry.  Eat small meals often instead of 3 large meals a day. Eat your meals slowly, and in a place where you are relaxed. Avoid bending over or lying down until 2-3 hours after eating.  Avoid eating meals 2-3 hours before bed.  Avoid drinking a lot of liquid with meals.  Cook foods using methods other than frying. Bake, grill, or broil food instead.  Avoid or limit: ? Chocolate. ? Peppermint or spearmint. ? Alcohol. ? Pepper. ? Black and decaffeinated coffee. ? Black and decaffeinated tea. ? Bubbly (carbonated) soft drinks. ? Caffeinated energy drinks and soft drinks.  Limit high-fat foods such as: ? Fatty meat or fried foods. ? Whole milk, cream, butter, or ice cream. ? Nuts and nut butters. ? Pastries, donuts, and sweets made with butter or shortening.  Avoid foods that cause symptoms. These foods may be different for everyone. Common foods that cause symptoms include: ? Tomatoes. ? Oranges, lemons, and limes. ? Peppers. ? Spicy food. ? Onions and garlic. ? Vinegar. Lifestyle  Maintain a healthy weight. Ask your doctor what weight is healthy for you. If you need to lose weight, work with your doctor to do so safely.  Exercise for at least 30 minutes for 5 or more days each week, or as told by your doctor.  Wear loose-fitting clothes.  Do not smoke. If you need help quitting, ask your doctor.  Sleep with the head of your bed higher than your feet. Use a wedge under the mattress or blocks under the bed frame to raise the head of the bed. Summary  When you have gastroesophageal reflux disease (GERD), food and lifestyle choices are very important in easing your symptoms.  Eat small meals often instead of 3 large meals a  day. Eat your meals slowly, and in a place where you are relaxed.  Limit high-fat foods such as fatty meat or fried foods.  Avoid bending over or lying down until 2-3 hours after eating.  Avoid peppermint and spearmint, caffeine, alcohol, and chocolate. This information is not intended to replace advice given to you by your health care provider. Make sure you discuss any questions you have with your health care provider. Document Revised: 12/30/2018 Document Reviewed: 10/14/2016 Elsevier Patient Education  2020 [place].  Preventing High Cholesterol Cholesterol is a white, waxy substance similar to fat that the human body needs to help build cells. The liver makes all the cholesterol that  a person's body needs. Having high cholesterol (hypercholesterolemia) increases a person's risk for heart disease and stroke. Extra (excess) cholesterol comes from the food the person eats. High cholesterol can often be prevented with diet and lifestyle changes. If you already have high cholesterol, you can control it with diet and lifestyle changes and with medicine. How can high cholesterol affect me? If you have high cholesterol, deposits (plaques) may build up on the walls of your arteries. The arteries are the blood vessels that carry blood away from your heart. Plaques make the arteries narrower and stiffer. This can limit or block blood flow and cause blood clots to form. Blood clots:  Are tiny balls of cells that form in your blood.  Can move to the heart or brain, causing a heart attack or stroke. Plaques in arteries greatly increase your risk for heart attack and stroke.Making diet and lifestyle changes can reduce your risk for these conditions that may threaten your life. What can increase my risk? This condition is more likely to develop in people who:  Eat foods that are high in saturated fat or cholesterol. Saturated fat is mostly found in: ? Foods that contain animal fat, such as red  meat and some dairy products. ? Certain fatty foods made from plants, such as tropical oils.  Are overweight.  Are not getting enough exercise.  Have a family history of high cholesterol. What actions can I take to prevent this? Nutrition   Eat less saturated fat.  Avoid trans fats (partially hydrogenated oils). These are often found in margarine and in some baked goods, fried foods, and snacks bought in packages.  Avoid precooked or cured meat, such as sausages or meat loaves.  Avoid foods and drinks that have added sugars.  Eat more fruits, vegetables, and whole grains.  Choose healthy sources of protein, such as fish, poultry, lean cuts of red meat, beans, peas, lentils, and nuts.  Choose healthy sources of fat, such as: ? Nuts. ? Vegetable oils, especially olive oil. ? Fish that have healthy fats (omega-3 fatty acids), such as mackerel or salmon. The items listed above may not be a complete list of recommended foods and beverages. Contact a dietitian for more information. Lifestyle  Lose weight if you are overweight. Losing 5-10 lb (2.3-4.5 kg) can help prevent or control high cholesterol. It can also lower your risk for diabetes and high blood pressure. Ask your health care provider to help you with a diet and exercise plan to lose weight safely.  Do not use any products that contain nicotine or tobacco, such as cigarettes, e-cigarettes, and chewing tobacco. If you need help quitting, ask your health care provider.  Limit your alcohol intake. ? Do not drink alcohol if:  Your health care provider tells you not to drink.  You are pregnant, may be pregnant, or are planning to become pregnant. ? If you drink alcohol:  Limit how much you use to:  0-1 drink a day for women.  0-2 drinks a day for men.  Be aware of how much alcohol is in your drink. In the [place] one drink equals one 12 oz bottle of beer (355 mL), one 5 oz glass of wine (148 mL), or one 1 oz glass of  hard liquor (44 mL). Activity   Get enough exercise. Each week, do at least 150 minutes of exercise that takes a medium level of effort (moderate-intensity exercise). ? This is exercise that:  Makes your heart beat faster and makes  you breathe harder than usual.  Allows you to still be able to talk. ? You could exercise in short sessions several times a day or longer sessions a few times a week. For example, on 5 days each week, you could walk fast or ride your bike 3 times a day for 10 minutes each time.  Do exercises as told by your health care provider. Medicines  In addition to diet and lifestyle changes, your health care provider may recommend medicines to help lower cholesterol. This may be a medicine to lower the amount of cholesterol your liver makes. You may need medicine if: ? Diet and lifestyle changes do not lower your cholesterol enough. ? You have high cholesterol and other risk factors for heart disease or stroke.  Take over-the-counter and prescription medicines only as told by your health care provider. General information  Manage your risk factors for high cholesterol. Talk with your health care provider about all your risk factors and how to lower your risk.  Manage other conditions that you have, such as diabetes or high blood pressure (hypertension).  Have blood tests to check your cholesterol levels at regular points in time as told by your health care provider.  Keep all follow-up visits as told by your health care provider. This is important. Where to find more information  American Heart Association: www.heart.org  National Heart, Lung, and Blood Institute: https://[place].com/ Summary  High cholesterol increases your risk for heart disease and stroke. By keeping your cholesterol level low, you can reduce your risk for these conditions.  High cholesterol can often be prevented with diet and lifestyle changes.  Work with your health care provider to manage  your risk factors, and have your blood tested regularly. This information is not intended to replace advice given to you by your health care provider. Make sure you discuss any questions you have with your health care provider. Document Revised: 12/31/2018 Document Reviewed: 05/17/2016 Elsevier Patient Education  2020 [employer].

## 2020-07-17 DIAGNOSIS — I152 Hypertension secondary to endocrine disorders: Secondary | ICD-10-CM | POA: Diagnosis not present

## 2020-07-17 DIAGNOSIS — E1165 Type 2 diabetes mellitus with hyperglycemia: Secondary | ICD-10-CM | POA: Diagnosis not present

## 2020-07-17 DIAGNOSIS — E1159 Type 2 diabetes mellitus with other circulatory complications: Secondary | ICD-10-CM | POA: Diagnosis not present

## 2020-07-17 DIAGNOSIS — E1169 Type 2 diabetes mellitus with other specified complication: Secondary | ICD-10-CM | POA: Diagnosis not present

## 2020-07-17 DIAGNOSIS — Z794 Long term (current) use of insulin: Secondary | ICD-10-CM | POA: Diagnosis not present

## 2020-07-30 ENCOUNTER — Other Ambulatory Visit: Payer: Self-pay

## 2020-07-30 ENCOUNTER — Encounter: Payer: Self-pay | Admitting: Family Medicine

## 2020-07-30 ENCOUNTER — Ambulatory Visit: Payer: BC Managed Care – PPO | Admitting: Family Medicine

## 2020-07-30 VITALS — BP 108/70 | HR 96 | Temp 98.3°F | Wt 243.6 lb

## 2020-07-30 DIAGNOSIS — F339 Major depressive disorder, recurrent, unspecified: Secondary | ICD-10-CM

## 2020-07-30 DIAGNOSIS — I10 Essential (primary) hypertension: Secondary | ICD-10-CM

## 2020-07-30 DIAGNOSIS — Z794 Long term (current) use of insulin: Secondary | ICD-10-CM

## 2020-07-30 DIAGNOSIS — R140 Abdominal distension (gaseous): Secondary | ICD-10-CM

## 2020-07-30 DIAGNOSIS — R0683 Snoring: Secondary | ICD-10-CM | POA: Diagnosis not present

## 2020-07-30 DIAGNOSIS — E1169 Type 2 diabetes mellitus with other specified complication: Secondary | ICD-10-CM | POA: Diagnosis not present

## 2020-07-30 DIAGNOSIS — R635 Abnormal weight gain: Secondary | ICD-10-CM

## 2020-07-30 MED ORDER — ESCITALOPRAM OXALATE 10 MG PO TABS: 10.0000 mg | ORAL_TABLET | Freq: Every day | ORAL | 1 refills | Status: DC

## 2020-07-30 NOTE — Progress Notes (Signed)
Subjective:    Patient ID: Omar Travis, male    DOB: Dec 31, 1965, 54 y.o.   MRN: [id number]  No chief complaint on file.   HPI Patient is a 54 year old male with past medical history significant for DM 2, HTN, depression, HLD, GERD who was seen for follow-up.  Patient endorses BP has been well controlled at appointments on lisinopril 5 mg daily.  Pt recently seen by endocrinology.  Hemoglobin A1c was 8% at [facility] with this provider but increased to 9% at Endo appt.  Patient drinking a case of Coke 0/day.  Notes getting less sleep as has to be at work earlier.  Make in bed by 11 PM and up by 530 am.  Pt endorses snoring.  Also notes abdominal bloating.  Pt states he would like to restart lexapro 10 mg.    Past Medical History:  Diagnosis Date  . Diabetes ([place])   . GERD (gastroesophageal reflux disease)   . HTN (hypertension)   . Hyperlipidemia     Allergies  Allergen Reactions  . Flexeril [Cyclobenzaprine]     Causes "breakout"    ROS General: Denies fever, chills, night sweats, changes in weight, changes in appetite  +fatigue, snoring HEENT: Denies headaches, ear pain, changes in vision, rhinorrhea, sore throat CV: Denies CP, palpitations, SOB, orthopnea Pulm: Denies SOB, cough, wheezing GI: Denies abdominal pain, nausea, vomiting, diarrhea, constipation  +bloating GU: Denies dysuria, hematuria, frequency Msk: Denies muscle cramps, joint pains Neuro: Denies weakness, numbness, tingling Skin: Denies rashes, bruising Psych: Denies anxiety, hallucinations  +depression     Objective:    Blood pressure 108/70, pulse 96, temperature 98.3 F (36.8 C), temperature source Oral, weight 243 lb 9.6 oz (110.5 kg), SpO2 97 %.   Gen. Pleasant, well-nourished, in no distress, normal affect   HEENT: Epes/AT, face symmetric, conjunctiva clear, no scleral icterus, PERRLA, EOMI, nares patent without drainage Lungs: no accessory muscle use, CTAB, no wheezes or rales Cardiovascular: RRR, no  m/r/g, no peripheral edema Abdomen: BS present, soft, NT/ND. Musculoskeletal: No deformities, no cyanosis or clubbing, normal tone Neuro:  A&Ox3, CN II-XII intact, normal gait Skin:  Warm, no lesions/ rash   Wt Readings from Last 3 Encounters:  07/30/20 243 lb 9.6 oz (110.5 kg)  06/25/20 249 lb (112.9 kg)  01/16/20 243 lb (110.2 kg)    Lab Results  Component Value Date   WBC 5.9 08/03/2019   HGB 14.8 08/03/2019   HCT 45.1 08/03/2019   PLT 150.0 08/03/2019   GLUCOSE 108 (H) 08/03/2019   CHOL 162 08/03/2019   TRIG 203.0 (H) 08/03/2019   HDL 37.80 (L) 08/03/2019   LDLDIRECT 96.0 08/03/2019   ALT 38 08/03/2019   AST 26 08/03/2019   NA 140 08/03/2019   K 4.5 08/03/2019   CL 105 08/03/2019   CREATININE 0.98 08/03/2019   BUN 16 08/03/2019   CO2 26 08/03/2019   HGBA1C 8.0 (A) 06/25/2020    Assessment/Plan:  Essential hypertension -controlled -Continue lisinopril 5 mg daily -Continue lifestyle modifications -Patient encouraged to check BP at home and keep a log to bring with him to clinic  Type 2 diabetes mellitus with other specified complication, with long-term current use of insulin (HCC) -Hemoglobin A1c 8% on 06/25/2020 -Per patient elevation in hemoglobin A1c at recent endocrinology visit -Discussed the importance of lifestyle modifications including increasing physical activity -Continue current medications including Invokana 300 mg daily, Toujeo 54 units daily, Ozempic 1 mg weekly -Continue to check FSBS regularly and keep a log  to bring with you to clinic  Depression, recurrent ([place]) -PHQ-9 score 8 -GAD-7 score 9 -We'll restart Lexapro 10 mg daily -Patient to consider counseling - Plan: escitalopram (LEXAPRO) 10 MG tablet  Snoring  - Plan: PSG Sleep Study  Weight gain -Discussed importance of lifestyle modifications including increasing physical activity -We'll continue to monitor -Given handout  Bloating -Discussed restarting omeprazole -Consider food  diary -We'll continue to monitor -reminded of importance of colonoscopy for colon cancer screening  F/u in 4-6 wks  Grier Mitts, MD

## 2020-07-30 NOTE — Patient Instructions (Addendum)
Sleep Apnea Sleep apnea affects breathing during sleep. It causes breathing to stop for a short time or to become shallow. It can also increase the risk of:  Heart attack.  Stroke.  Being very overweight (obese).  Diabetes.  Heart failure.  Irregular heartbeat. The goal of treatment is to help you breathe normally again. What are the causes? There are three kinds of sleep apnea:  Obstructive sleep apnea. This is caused by a blocked or collapsed airway.  Central sleep apnea. This happens when the brain does not send the right signals to the muscles that control breathing.  Mixed sleep apnea. This is a combination of obstructive and central sleep apnea. The most common cause of this condition is a collapsed or blocked airway. This can happen if:  Your throat muscles are too relaxed.  Your tongue and tonsils are too large.  You are overweight.  Your airway is too small. What increases the risk?  Being overweight.  Smoking.  Having a small airway.  Being older.  Being male.  Drinking alcohol.  Taking medicines to calm yourself (sedatives or tranquilizers).  Having family members with the condition. What are the signs or symptoms?  Trouble staying asleep.  Being sleepy or tired during the day.  Getting angry a lot.  Loud snoring.  Headaches in the morning.  Not being able to focus your mind (concentrate).  Forgetting things.  Less interest in sex.  Mood swings.  Personality changes.  Feelings of sadness (depression).  Waking up a lot during the night to pee (urinate).  Dry mouth.  Sore throat. How is this diagnosed?  Your medical history.  A physical exam.  A test that is done when you are sleeping (sleep study). The test is most often done in a sleep lab but may also be done at home. How is this treated?   Sleeping on your side.  Using a medicine to get rid of mucus in your nose (decongestant).  Avoiding the use of alcohol,  medicines to help you relax, or certain pain medicines (narcotics).  Losing weight, if needed.  Changing your diet.  Not smoking.  Using a machine to open your airway while you sleep, such as: ? An oral appliance. This is a mouthpiece that shifts your lower jaw forward. ? A CPAP device. This device blows air through a mask when you breathe out (exhale). ? An EPAP device. This has valves that you put in each nostril. ? A BPAP device. This device blows air through a mask when you breathe in (inhale) and breathe out.  Having surgery if other treatments do not work. It is important to get treatment for sleep apnea. Without treatment, it can lead to:  High blood pressure.  Coronary artery disease.  In men, not being able to have an erection (impotence).  Reduced thinking ability. Follow these instructions at home: Lifestyle  Make changes that your doctor recommends.  Eat a healthy diet.  Lose weight if needed.  Avoid alcohol, medicines to help you relax, and some pain medicines.  Do not use any products that contain nicotine or tobacco, such as cigarettes, e-cigarettes, and chewing tobacco. If you need help quitting, ask your doctor. General instructions  Take over-the-counter and prescription medicines only as told by your doctor.  If you were given a machine to use while you sleep, use it only as told by your doctor.  If you are having surgery, make sure to tell your doctor you have sleep apnea. You   may need to bring your device with you.  Keep all follow-up visits as told by your doctor. This is important. Contact a doctor if:  The machine that you were given to use during sleep bothers you or does not seem to be working.  You do not get better.  You get worse. Get help right away if:  Your chest hurts.  You have trouble breathing in enough air.  You have an uncomfortable feeling in your back, arms, or stomach.  You have trouble talking.  One side of your  body feels weak.  A part of your face is hanging down. These symptoms may be an emergency. Do not wait to see if the symptoms will go away. Get medical help right away. Call your local emergency services (911 in the [place] Do not drive yourself to the hospital. Summary  This condition affects breathing during sleep.  The most common cause is a collapsed or blocked airway.  The goal of treatment is to help you breathe normally while you sleep. This information is not intended to replace advice given to you by your health care provider. Make sure you discuss any questions you have with your health care provider. Document Revised: 06/25/2018 Document Reviewed: 05/04/2018 Elsevier Patient Education  [place].  Diabetes Mellitus and Exercise Exercising regularly is important for your overall health, especially when you have diabetes (diabetes mellitus). Exercising is not only about losing weight. It has many other health benefits, such as increasing muscle strength and bone density and reducing body fat and stress. This leads to improved fitness, flexibility, and endurance, all of which result in better overall health. Exercise has additional benefits for people with diabetes, including:  Reducing appetite.  Helping to lower and control blood glucose.  Lowering blood pressure.  Helping to control amounts of fatty substances (lipids) in the blood, such as cholesterol and triglycerides.  Helping the body to respond better to insulin (improving insulin sensitivity).  Reducing how much insulin the body needs.  Decreasing the risk for heart disease by: ? Lowering cholesterol and triglyceride levels. ? Increasing the levels of good cholesterol. ? Lowering blood glucose levels. What is my activity plan? Your health care provider or certified diabetes educator can help you make a plan for the type and frequency of exercise (activity plan) that works for you. Make sure that you:  Do  at least 150 minutes of moderate-intensity or vigorous-intensity exercise each week. This could be brisk walking, biking, or water aerobics. ? Do stretching and strength exercises, such as yoga or weightlifting, at least 2 times a week. ? Spread out your activity over at least 3 days of the week.  Get some form of physical activity every day. ? Do not go more than 2 days in a row without some kind of physical activity. ? Avoid being inactive for more than 30 minutes at a time. Take frequent breaks to walk or stretch.  Choose a type of exercise or activity that you enjoy, and set realistic goals.  Start slowly, and gradually increase the intensity of your exercise over time. What do I need to know about managing my diabetes?   Check your blood glucose before and after exercising. ? If your blood glucose is 240 mg/dL (13.3 mmol/L) or higher before you exercise, check your urine for ketones. If you have ketones in your urine, do not exercise until your blood glucose returns to normal. ? If your blood glucose is 100 mg/dL (5.6 mmol/L) or  lower, eat a snack containing 15-20 grams of carbohydrate. Check your blood glucose 15 minutes after the snack to make sure that your level is above 100 mg/dL (5.6 mmol/L) before you start your exercise.  Know the symptoms of low blood glucose (hypoglycemia) and how to treat it. Your risk for hypoglycemia increases during and after exercise. Common symptoms of hypoglycemia can include: ? Hunger. ? Anxiety. ? Sweating and feeling clammy. ? Confusion. ? Dizziness or feeling light-headed. ? Increased heart rate or palpitations. ? Blurry vision. ? Tingling or numbness around the mouth, lips, or tongue. ? Tremors or shakes. ? Irritability.  Keep a rapid-acting carbohydrate snack available before, during, and after exercise to help prevent or treat hypoglycemia.  Avoid injecting insulin into areas of the body that are going to be exercised. For example, avoid  injecting insulin into: ? The arms, when playing tennis. ? The legs, when jogging.  Keep records of your exercise habits. Doing this can help you and your health care provider adjust your diabetes management plan as needed. Write down: ? Food that you eat before and after you exercise. ? Blood glucose levels before and after you exercise. ? The type and amount of exercise you have done. ? When your insulin is expected to peak, if you use insulin. Avoid exercising at times when your insulin is peaking.  When you start a new exercise or activity, work with your health care provider to make sure the activity is safe for you, and to adjust your insulin, medicines, or food intake as needed.  Drink plenty of water while you exercise to prevent dehydration or heat stroke. Drink enough fluid to keep your urine clear or pale yellow. Summary  Exercising regularly is important for your overall health, especially when you have diabetes (diabetes mellitus).  Exercising has many health benefits, such as increasing muscle strength and bone density and reducing body fat and stress.  Your health care provider or certified diabetes educator can help you make a plan for the type and frequency of exercise (activity plan) that works for you.  When you start a new exercise or activity, work with your health care provider to make sure the activity is safe for you, and to adjust your insulin, medicines, or food intake as needed. This information is not intended to replace advice given to you by your health care provider. Make sure you discuss any questions you have with your health care provider. Document Revised: 04/02/2017 Document Reviewed: 02/18/2016 Elsevier Patient Education  [place].  Diabetes Mellitus and Nutrition, Adult When you have diabetes (diabetes mellitus), it is very important to have healthy eating habits because your blood sugar (glucose) levels are greatly affected by what you eat  and drink. Eating healthy foods in the appropriate amounts, at about the same times every day, can help you:  Control your blood glucose.  Lower your risk of heart disease.  Improve your blood pressure.  Reach or maintain a healthy weight. Every person with diabetes is different, and each person has different needs for a meal plan. Your health care provider may recommend that you work with a diet and nutrition specialist (dietitian) to make a meal plan that is best for you. Your meal plan may vary depending on factors such as:  The calories you need.  The medicines you take.  Your weight.  Your blood glucose, blood pressure, and cholesterol levels.  Your activity level.  Other health conditions you have, such as heart or  kidney disease. How do carbohydrates affect me? Carbohydrates, also called carbs, affect your blood glucose level more than any other type of food. Eating carbs naturally raises the amount of glucose in your blood. Carb counting is a method for keeping track of how many carbs you eat. Counting carbs is important to keep your blood glucose at a healthy level, especially if you use insulin or take certain oral diabetes medicines. It is important to know how many carbs you can safely have in each meal. This is different for every person. Your dietitian can help you calculate how many carbs you should have at each meal and for each snack. Foods that contain carbs include:  Bread, cereal, rice, pasta, and crackers.  Potatoes and corn.  Peas, beans, and lentils.  Milk and yogurt.  Fruit and juice.  Desserts, such as cakes, cookies, ice cream, and candy. How does alcohol affect me? Alcohol can cause a sudden decrease in blood glucose (hypoglycemia), especially if you use insulin or take certain oral diabetes medicines. Hypoglycemia can be a life-threatening condition. Symptoms of hypoglycemia (sleepiness, dizziness, and confusion) are similar to symptoms of having too  much alcohol. If your health care provider says that alcohol is safe for you, follow these guidelines:  Limit alcohol intake to no more than 1 drink per day for nonpregnant women and 2 drinks per day for men. One drink equals 12 oz of beer, 5 oz of wine, or 1 oz of hard liquor.  Do not drink on an empty stomach.  Keep yourself hydrated with water, diet soda, or unsweetened iced tea.  Keep in mind that regular soda, juice, and other mixers may contain a lot of sugar and must be counted as carbs. What are tips for following this plan?  Reading food labels  Start by checking the serving size on the "Nutrition Facts" label of packaged foods and drinks. The amount of calories, carbs, fats, and other nutrients listed on the label is based on one serving of the item. Many items contain more than one serving per package.  Check the total grams (g) of carbs in one serving. You can calculate the number of servings of carbs in one serving by dividing the total carbs by 15. For example, if a food has 30 g of total carbs, it would be equal to 2 servings of carbs.  Check the number of grams (g) of saturated and trans fats in one serving. Choose foods that have low or no amount of these fats.  Check the number of milligrams (mg) of salt (sodium) in one serving. Most people should limit total sodium intake to less than 2,300 mg per day.  Always check the nutrition information of foods labeled as "low-fat" or "nonfat". These foods may be higher in added sugar or refined carbs and should be avoided.  Talk to your dietitian to identify your daily goals for nutrients listed on the label. Shopping  Avoid buying canned, premade, or processed foods. These foods tend to be high in fat, sodium, and added sugar.  Shop around the outside edge of the grocery store. This includes fresh fruits and vegetables, bulk grains, fresh meats, and fresh dairy. Cooking  Use low-heat cooking methods, such as baking, instead  of high-heat cooking methods like deep frying.  Cook using healthy oils, such as olive, canola, or sunflower oil.  Avoid cooking with butter, cream, or high-fat meats. Meal planning  Eat meals and snacks regularly, preferably at the same times every day.  Avoid going long periods of time without eating.  Eat foods high in fiber, such as fresh fruits, vegetables, beans, and whole grains. Talk to your dietitian about how many servings of carbs you can eat at each meal.  Eat 4-6 ounces (oz) of lean protein each day, such as lean meat, chicken, fish, eggs, or tofu. One oz of lean protein is equal to: ? 1 oz of meat, chicken, or fish. ? 1 egg. ?  cup of tofu.  Eat some foods each day that contain healthy fats, such as avocado, nuts, seeds, and fish. Lifestyle  Check your blood glucose regularly.  Exercise regularly as told by your health care provider. This may include: ? 150 minutes of moderate-intensity or vigorous-intensity exercise each week. This could be brisk walking, biking, or water aerobics. ? Stretching and doing strength exercises, such as yoga or weightlifting, at least 2 times a week.  Take medicines as told by your health care provider.  Do not use any products that contain nicotine or tobacco, such as cigarettes and e-cigarettes. If you need help quitting, ask your health care provider.  Work with a Social worker or diabetes educator to identify strategies to manage stress and any emotional and social challenges. Questions to ask a health care provider  Do I need to meet with a diabetes educator?  Do I need to meet with a dietitian?  What number can I call if I have questions?  When are the best times to check my blood glucose? Where to find more information:  American Diabetes Association: diabetes.org  Academy of Nutrition and Dietetics: www.eatright.[employer] of Diabetes and Digestive and Kidney Diseases (NIH): [employer] Summary  A  healthy meal plan will help you control your blood glucose and maintain a healthy lifestyle.  Working with a diet and nutrition specialist (dietitian) can help you make a meal plan that is best for you.  Keep in mind that carbohydrates (carbs) and alcohol have immediate effects on your blood glucose levels. It is important to count carbs and to use alcohol carefully. This information is not intended to replace advice given to you by your health care provider. Make sure you discuss any questions you have with your health care provider. Document Revised: 08/21/2017 Document Reviewed: 10/13/2016 Elsevier Patient Education  [place].  Abdominal Bloating When you have abdominal bloating, your abdomen may feel full, tight, or painful. It may also look bigger than normal or swollen (distended). Common causes of abdominal bloating include:  Swallowing air.  Constipation.  Problems digesting food.  Eating too much.  Irritable bowel syndrome. This is a condition that affects the large intestine.  Lactose intolerance. This is an inability to digest lactose, a natural sugar in dairy products.  Celiac disease. This is a condition that affects the ability to digest gluten, a protein found in some grains.  Gastroparesis. This is a condition that slows down the movement of food in the stomach and small intestine. It is more common in people with diabetes mellitus.  Gastroesophageal reflux disease (GERD). This is a digestive condition that makes stomach acid flow back into the esophagus.  Urinary retention. This means that the body is holding onto urine, and the bladder cannot be emptied all the way. Follow these instructions at home: Eating and drinking  Avoid eating too much.  Try not to swallow air while talking or eating.  Avoid eating while lying down.  Avoid these foods and drinks: ? Foods that cause gas, such  as broccoli, cabbage, cauliflower, and baked beans. ? Carbonated  drinks. ? Hard candy. ? Chewing gum. Medicines  Take over-the-counter and prescription medicines only as told by your health care provider.  Take probiotic medicines. These medicines contain live bacteria or yeasts that can help digestion.  Take coated peppermint oil capsules. Activity  Try to exercise regularly. Exercise may help to relieve bloating that is caused by gas and relieve constipation. General instructions  Keep all follow-up visits as told by your health care provider. This is important. Contact a health care provider if:  You have nausea and vomiting.  You have diarrhea.  You have abdominal pain.  You have unusual weight loss or weight gain.  You have severe pain, and medicines do not help. Get help right away if:  You have severe chest pain.  You have trouble breathing.  You have shortness of breath.  You have trouble urinating.  You have darker urine than normal.  You have blood in your stools or have dark, tarry stools. Summary  Abdominal bloating means that the abdomen is swollen.  Common causes of abdominal bloating are swallowing air, constipation, and problems digesting food.  Avoid eating too much and avoid swallowing air.  Avoid foods that cause gas, carbonated drinks, hard candy, and chewing gum. This information is not intended to replace advice given to you by your health care provider. Make sure you discuss any questions you have with your health care provider. Document Revised: 12/27/2018 Document Reviewed: 10/10/2016 Elsevier Patient Education  [place].

## 2020-08-04 ENCOUNTER — Encounter: Payer: Self-pay | Admitting: Family Medicine

## 2020-08-15 DIAGNOSIS — R0989 Other specified symptoms and signs involving the circulatory and respiratory systems: Secondary | ICD-10-CM | POA: Diagnosis not present

## 2020-08-15 DIAGNOSIS — Z6832 Body mass index (BMI) 32.0-32.9, adult: Secondary | ICD-10-CM | POA: Diagnosis not present

## 2020-08-15 DIAGNOSIS — R059 Cough, unspecified: Secondary | ICD-10-CM | POA: Diagnosis not present

## 2020-12-25 DIAGNOSIS — R110 Nausea: Secondary | ICD-10-CM | POA: Diagnosis not present

## 2020-12-25 DIAGNOSIS — R109 Unspecified abdominal pain: Secondary | ICD-10-CM | POA: Diagnosis not present

## 2020-12-25 DIAGNOSIS — R197 Diarrhea, unspecified: Secondary | ICD-10-CM | POA: Diagnosis not present

## 2020-12-25 DIAGNOSIS — Z6832 Body mass index (BMI) 32.0-32.9, adult: Secondary | ICD-10-CM | POA: Diagnosis not present

## 2021-01-14 ENCOUNTER — Other Ambulatory Visit: Payer: Self-pay | Admitting: Family Medicine

## 2021-01-14 DIAGNOSIS — E1165 Type 2 diabetes mellitus with hyperglycemia: Secondary | ICD-10-CM | POA: Diagnosis not present

## 2021-01-14 DIAGNOSIS — I152 Hypertension secondary to endocrine disorders: Secondary | ICD-10-CM | POA: Diagnosis not present

## 2021-01-14 DIAGNOSIS — Z9119 Patient's noncompliance with other medical treatment and regimen: Secondary | ICD-10-CM | POA: Diagnosis not present

## 2021-01-14 DIAGNOSIS — E1159 Type 2 diabetes mellitus with other circulatory complications: Secondary | ICD-10-CM | POA: Diagnosis not present

## 2021-01-14 DIAGNOSIS — Z794 Long term (current) use of insulin: Secondary | ICD-10-CM | POA: Diagnosis not present

## 2021-01-14 DIAGNOSIS — F339 Major depressive disorder, recurrent, unspecified: Secondary | ICD-10-CM

## 2021-03-23 ENCOUNTER — Other Ambulatory Visit: Payer: Self-pay | Admitting: Family Medicine

## 2021-03-23 DIAGNOSIS — I10 Essential (primary) hypertension: Secondary | ICD-10-CM

## 2021-04-08 ENCOUNTER — Telehealth: Payer: BC Managed Care – PPO | Admitting: Family Medicine

## 2021-05-02 ENCOUNTER — Other Ambulatory Visit: Payer: Self-pay | Admitting: Family Medicine

## 2021-05-02 DIAGNOSIS — F339 Major depressive disorder, recurrent, unspecified: Secondary | ICD-10-CM

## 2021-05-05 ENCOUNTER — Telehealth: Payer: BC Managed Care – PPO | Admitting: Physician Assistant

## 2021-05-05 ENCOUNTER — Encounter: Payer: Self-pay | Admitting: Physician Assistant

## 2021-05-05 DIAGNOSIS — M542 Cervicalgia: Secondary | ICD-10-CM

## 2021-05-05 DIAGNOSIS — R509 Fever, unspecified: Secondary | ICD-10-CM

## 2021-05-05 DIAGNOSIS — G4489 Other headache syndrome: Secondary | ICD-10-CM

## 2021-05-05 NOTE — Progress Notes (Signed)
Based on what you shared with me, I feel your condition warrants further evaluation and I recommend that you be seen in a face to face visit.   NOTE: There will be NO CHARGE for this eVisit  I tried to call patient on mobile and home phone with no answer.  Omar Travis, I have tried to call you to see if you are well and to advise you to take fever reducer medication such as Tylenol and Ibuprofen. I would suggest a face to face visit for further evaluation as your symptoms of fever, headache maybe related to the Flu. If you do have neck pain- and not generalized body aches, then I would suggest going to the ER for further evaluation as fever, neck pain, headache maybe an infection that warrants emergent evaluation.     If you are having a true medical emergency please call 911.      For an urgent face to face visit, [facility] has six urgent care centers for your convenience:     [facility] [place] Get Driving Directions [id number] [place], [place] [address]    [facility]) Get Driving Directions [id number] [place], [place] [address]  [facility] [place] ([place]) Get Driving Directions [id number] [address] [place],  [place]  [address]  [facility] Get Driving Directions [id number] [place] [address], [place], [place] [address]   [facility] Get Driving Directions  [id number] [address] [place], [place] [address]   [facility] [place] Get Driving Directions [id number] [address] [place], [place] [address]  Your MyChart E-visit questionnaire answers were reviewed by a board certified advanced clinical practitioner to complete your personal care plan based on your specific symptoms.  Thank you for using  e-Visits.   I spent 5-10 minutes on review and completion of this note- Lacy Duverney [facility]

## 2021-05-06 DIAGNOSIS — Z1152 Encounter for screening for COVID-19: Secondary | ICD-10-CM | POA: Diagnosis not present

## 2021-05-06 DIAGNOSIS — R509 Fever, unspecified: Secondary | ICD-10-CM | POA: Diagnosis not present

## 2021-05-06 DIAGNOSIS — Z6832 Body mass index (BMI) 32.0-32.9, adult: Secondary | ICD-10-CM | POA: Diagnosis not present

## 2021-05-08 DIAGNOSIS — K529 Noninfective gastroenteritis and colitis, unspecified: Secondary | ICD-10-CM | POA: Diagnosis not present

## 2021-06-10 ENCOUNTER — Other Ambulatory Visit: Payer: Self-pay | Admitting: Family Medicine

## 2021-06-10 DIAGNOSIS — F339 Major depressive disorder, recurrent, unspecified: Secondary | ICD-10-CM

## 2021-07-02 ENCOUNTER — Telehealth: Payer: Self-pay

## 2021-07-02 NOTE — Telephone Encounter (Signed)
Wife of patient called and asked if patient should be seen sooner than appt date due to possible jaundice call was transferred to a triage nurse.

## 2021-07-05 NOTE — Telephone Encounter (Signed)
Spoke with patient after Dr Volanda Napoleon looked at chart, patient states he is not itching, and has no abdominal pain. Advised if he does, to go to ED.patient states it is now better.

## 2021-07-08 ENCOUNTER — Encounter: Payer: Self-pay | Admitting: Family Medicine

## 2021-07-08 ENCOUNTER — Ambulatory Visit: Payer: BC Managed Care – PPO | Admitting: Family Medicine

## 2021-07-08 ENCOUNTER — Other Ambulatory Visit: Payer: Self-pay

## 2021-07-08 VITALS — BP 112/70 | HR 102 | Temp 98.5°F | Wt 237.2 lb

## 2021-07-08 DIAGNOSIS — Z794 Long term (current) use of insulin: Secondary | ICD-10-CM

## 2021-07-08 DIAGNOSIS — Z1159 Encounter for screening for other viral diseases: Secondary | ICD-10-CM

## 2021-07-08 DIAGNOSIS — E1169 Type 2 diabetes mellitus with other specified complication: Secondary | ICD-10-CM | POA: Diagnosis not present

## 2021-07-08 DIAGNOSIS — I10 Essential (primary) hypertension: Secondary | ICD-10-CM | POA: Diagnosis not present

## 2021-07-08 DIAGNOSIS — R17 Unspecified jaundice: Secondary | ICD-10-CM | POA: Diagnosis not present

## 2021-07-08 LAB — CBC WITH DIFFERENTIAL/PLATELET
Basophils Absolute: 0.1 10*3/uL (ref 0.0–0.1)
Basophils Relative: 0.8 % (ref 0.0–3.0)
Eosinophils Absolute: 0.2 10*3/uL (ref 0.0–0.7)
Eosinophils Relative: 2.5 % (ref 0.0–5.0)
HCT: 46 % (ref 39.0–52.0)
Hemoglobin: 14.9 g/dL (ref 13.0–17.0)
Lymphocytes Relative: 18.1 % (ref 12.0–46.0)
Lymphs Abs: 1.5 10*3/uL (ref 0.7–4.0)
MCHC: 32.3 g/dL (ref 30.0–36.0)
MCV: 84.6 fl (ref 78.0–100.0)
Monocytes Absolute: 0.5 10*3/uL (ref 0.1–1.0)
Monocytes Relative: 5.8 % (ref 3.0–12.0)
Neutro Abs: 6.2 10*3/uL (ref 1.4–7.7)
Neutrophils Relative %: 72.8 % (ref 43.0–77.0)
Platelets: 205 10*3/uL (ref 150.0–400.0)
RBC: 5.44 Mil/uL (ref 4.22–5.81)
RDW: 14.7 % (ref 11.5–15.5)
WBC: 8.5 10*3/uL (ref 4.0–10.5)

## 2021-07-08 LAB — COMPREHENSIVE METABOLIC PANEL
ALT: 58 U/L — ABNORMAL HIGH (ref 0–53)
AST: 30 U/L (ref 0–37)
Albumin: 4.4 g/dL (ref 3.5–5.2)
Alkaline Phosphatase: 151 U/L — ABNORMAL HIGH (ref 39–117)
BUN: 15 mg/dL (ref 6–23)
CO2: 29 mEq/L (ref 19–32)
Calcium: 9.7 mg/dL (ref 8.4–10.5)
Chloride: 98 mEq/L (ref 96–112)
Creatinine, Ser: 1.23 mg/dL (ref 0.40–1.50)
GFR: 67.31 mL/min (ref >60.00)
Glucose, Bld: 282 mg/dL — ABNORMAL HIGH (ref 70–99)
Potassium: 4.7 mEq/L (ref 3.5–5.1)
Sodium: 133 mEq/L — ABNORMAL LOW (ref 135–145)
Total Bilirubin: 0.9 mg/dL (ref 0.2–1.2)
Total Protein: 7.4 g/dL (ref 6.0–8.3)

## 2021-07-08 LAB — HEMOGLOBIN A1C: Hgb A1c MFr Bld: 9.2 % — ABNORMAL HIGH (ref 4.6–6.5)

## 2021-07-08 LAB — LIPID PANEL
Cholesterol: 181 mg/dL (ref 0–200)
HDL: 47.6 mg/dL (ref >39.00)
NonHDL: 133.42
Total CHOL/HDL Ratio: 4
Triglycerides: 268 mg/dL — ABNORMAL HIGH (ref 0.0–149.0)
VLDL: 53.6 mg/dL — ABNORMAL HIGH (ref 0.0–40.0)

## 2021-07-08 LAB — LDL CHOLESTEROL, DIRECT: Direct LDL: 108 mg/dL

## 2021-07-08 LAB — GAMMA GT: GGT: 879 U/L — ABNORMAL HIGH (ref 7–51)

## 2021-07-08 NOTE — Progress Notes (Signed)
Subjective:    Patient ID: Omar Travis, male    DOB: 1966/03/16, 55 y.o.   MRN: [id number]  Chief Complaint  Patient presents with   Jaundice    Had som yellowing of skin and eyes last week, states it was just a touch of yellow. Felt dehydrated, drank some Pedialyte.     HPI Patient is a 55 yo male with pmh sig for HTN, DM, h/o MDD, HLD, and GERD who was seen today for acute concern.  Pt had pruritis last wk. States his wife noticed his face and eyes were yellow.  Pt had nausea, bloating, increased gas, upper abdominal pain.  Did not notice an association with food.  Previously seen by [facility].  Drinking 4-6 bottles of water per day and lots of Coke Zero.  Has beer occasionally.  Notes regular BMs daily, at times has to strain.  Not using any NSAIDs. Followed by Endo for DM.  Past Medical History:  Diagnosis Date   Diabetes ([place])    GERD (gastroesophageal reflux disease)    HTN (hypertension)    Hyperlipidemia     Allergies  Allergen Reactions   Flexeril [Cyclobenzaprine]     Causes "breakout"    ROS General: Denies fever, chills, night sweats, changes in weight, changes in appetite HEENT: Denies headaches, ear pain, changes in vision, rhinorrhea, sore throat CV: Denies CP, palpitations, SOB, orthopnea Pulm: Denies SOB, cough, wheezing GI: Denies  vomiting, diarrhea, constipation +nausea, abdominal pain, bloating, increased gas GU: Denies dysuria, hematuria, frequency Msk: Denies muscle cramps, joint pains Neuro: Denies weakness, numbness, tingling Skin: Denies rashes, bruising Psych: Denies depression, anxiety, hallucinations     Objective:    Blood pressure 112/70, pulse (!) 102, temperature 98.5 F (36.9 C), temperature source Oral, weight 237 lb 3.2 oz (107.6 kg), SpO2 94 %.  Gen. Pleasant, well-nourished, in no distress, normal affect   HEENT: [place]/AT, face symmetric, conjunctiva clear, no scleral icterus, PERRLA, EOMI, nares patent without drainage Lungs: no  accessory muscle use, CTAB, no wheezes or rales Cardiovascular: RRR, no m/r/g, no peripheral edema Abdomen: normoactive BS present, soft, TTP in RUQ, LUQ, and epigastric area. Mildly distended Musculoskeletal: No deformities, no cyanosis or clubbing, normal tone Neuro:  A&Ox3, CN II-XII intact, normal gait Skin:  Warm, no lesions/ rash   Wt Readings from Last 3 Encounters:  07/08/21 237 lb 3.2 oz (107.6 kg)  07/30/20 243 lb 9.6 oz (110.5 kg)  06/25/20 249 lb (112.9 kg)    Lab Results  Component Value Date   WBC 5.9 08/03/2019   HGB 14.8 08/03/2019   HCT 45.1 08/03/2019   PLT 150.0 08/03/2019   GLUCOSE 108 (H) 08/03/2019   CHOL 162 08/03/2019   TRIG 203.0 (H) 08/03/2019   HDL 37.80 (L) 08/03/2019   LDLDIRECT 96.0 08/03/2019   ALT 38 08/03/2019   AST 26 08/03/2019   NA 140 08/03/2019   K 4.5 08/03/2019   CL 105 08/03/2019   CREATININE 0.98 08/03/2019   BUN 16 08/03/2019   CO2 26 08/03/2019   HGBA1C 8.0 (A) 06/25/2020    Assessment/Plan:  Jaundice  -concern for cholelithiasis, cholecystitis, pancreatitis, fatty liver  -obtain labs and u/s -advised to avoid greasy foods -f/u with GI  -given strict precautions -further recommendations based on imaging - Plan: CMP, Gamma GT, Lipid panel, CBC with Differential/Platelet, Hep C Antibody, US Abdomen Limited RUQ (LIVER/GB)  Type 2 diabetes mellitus with other specified complication, with long-term current use of insulin (HCC)  -Hgb  A1C 8.0% on 06/25/20 -followed by Ra[facility] S[facility] -continue current meds including Ozempic 1 mg wkly, invokana 300 mg daily, and Toujeo 56 units daily. - Plan: Hemoglobin A1c  Essential hypertension  -controlled -continue lisinopril 5 mg  - Plan: CMP  Need for hepatitis C screening test  - Plan: Hep C Antibody  F/u prn in the next 1-2 wks  Grier Mitts, MD

## 2021-07-09 LAB — HEPATITIS C ANTIBODY
Hepatitis C Ab: NONREACTIVE
SIGNAL TO CUT-OFF: 0.01 (ref <1.00)

## 2021-07-11 ENCOUNTER — Other Ambulatory Visit: Payer: Self-pay

## 2021-07-11 ENCOUNTER — Inpatient Hospital Stay (HOSPITAL_BASED_OUTPATIENT_CLINIC_OR_DEPARTMENT_OTHER)
Admission: RE | Admit: 2021-07-11 | Discharge: 2021-07-11 | Disposition: A | Discharge status: Home / Self Care | Payer: BC Managed Care – PPO | Source: Ambulatory Visit | Attending: Family Medicine | Admitting: Family Medicine

## 2021-07-11 DIAGNOSIS — E669 Obesity, unspecified: Secondary | ICD-10-CM | POA: Diagnosis not present

## 2021-07-11 DIAGNOSIS — Z800 Family history of malignant neoplasm of digestive organs: Secondary | ICD-10-CM | POA: Diagnosis not present

## 2021-07-11 DIAGNOSIS — E871 Hypo-osmolality and hyponatremia: Secondary | ICD-10-CM | POA: Diagnosis not present

## 2021-07-11 DIAGNOSIS — K8042 Calculus of bile duct with acute cholecystitis without obstruction: Secondary | ICD-10-CM | POA: Diagnosis not present

## 2021-07-11 DIAGNOSIS — K8309 Other cholangitis: Secondary | ICD-10-CM | POA: Diagnosis not present

## 2021-07-11 DIAGNOSIS — Z794 Long term (current) use of insulin: Secondary | ICD-10-CM | POA: Diagnosis not present

## 2021-07-11 DIAGNOSIS — E785 Hyperlipidemia, unspecified: Secondary | ICD-10-CM | POA: Diagnosis not present

## 2021-07-11 DIAGNOSIS — K219 Gastro-esophageal reflux disease without esophagitis: Secondary | ICD-10-CM | POA: Diagnosis not present

## 2021-07-11 DIAGNOSIS — R1011 Right upper quadrant pain: Secondary | ICD-10-CM | POA: Diagnosis not present

## 2021-07-11 DIAGNOSIS — R17 Unspecified jaundice: Secondary | ICD-10-CM | POA: Insufficient documentation

## 2021-07-11 DIAGNOSIS — Z6831 Body mass index (BMI) 31.0-31.9, adult: Secondary | ICD-10-CM | POA: Diagnosis not present

## 2021-07-11 DIAGNOSIS — K811 Chronic cholecystitis: Secondary | ICD-10-CM | POA: Diagnosis not present

## 2021-07-11 DIAGNOSIS — Z20822 Contact with and (suspected) exposure to covid-19: Secondary | ICD-10-CM | POA: Diagnosis not present

## 2021-07-11 DIAGNOSIS — K8067 Calculus of gallbladder and bile duct with acute and chronic cholecystitis with obstruction: Secondary | ICD-10-CM | POA: Diagnosis not present

## 2021-07-11 DIAGNOSIS — Z833 Family history of diabetes mellitus: Secondary | ICD-10-CM | POA: Diagnosis not present

## 2021-07-11 DIAGNOSIS — K760 Fatty (change of) liver, not elsewhere classified: Secondary | ICD-10-CM | POA: Diagnosis not present

## 2021-07-11 DIAGNOSIS — I10 Essential (primary) hypertension: Secondary | ICD-10-CM | POA: Diagnosis not present

## 2021-07-11 DIAGNOSIS — K810 Acute cholecystitis: Secondary | ICD-10-CM | POA: Diagnosis not present

## 2021-07-11 DIAGNOSIS — N281 Cyst of kidney, acquired: Secondary | ICD-10-CM | POA: Diagnosis not present

## 2021-07-11 DIAGNOSIS — E872 Acidosis, unspecified: Secondary | ICD-10-CM | POA: Diagnosis not present

## 2021-07-11 DIAGNOSIS — Z7984 Long term (current) use of oral hypoglycemic drugs: Secondary | ICD-10-CM | POA: Diagnosis not present

## 2021-07-11 DIAGNOSIS — K820 Obstruction of gallbladder: Secondary | ICD-10-CM | POA: Diagnosis not present

## 2021-07-11 DIAGNOSIS — K7689 Other specified diseases of liver: Secondary | ICD-10-CM | POA: Diagnosis not present

## 2021-07-11 DIAGNOSIS — R109 Unspecified abdominal pain: Secondary | ICD-10-CM | POA: Diagnosis not present

## 2021-07-11 DIAGNOSIS — Z79899 Other long term (current) drug therapy: Secondary | ICD-10-CM | POA: Diagnosis not present

## 2021-07-11 DIAGNOSIS — K660 Peritoneal adhesions (postprocedural) (postinfection): Secondary | ICD-10-CM | POA: Diagnosis not present

## 2021-07-11 DIAGNOSIS — K8050 Calculus of bile duct without cholangitis or cholecystitis without obstruction: Secondary | ICD-10-CM | POA: Diagnosis not present

## 2021-07-11 DIAGNOSIS — E1165 Type 2 diabetes mellitus with hyperglycemia: Secondary | ICD-10-CM | POA: Diagnosis not present

## 2021-07-11 DIAGNOSIS — Z888 Allergy status to other drugs, medicaments and biological substances status: Secondary | ICD-10-CM | POA: Diagnosis not present

## 2021-07-11 DIAGNOSIS — K859 Acute pancreatitis without necrosis or infection, unspecified: Secondary | ICD-10-CM | POA: Diagnosis not present

## 2021-07-12 ENCOUNTER — Other Ambulatory Visit: Payer: Self-pay | Admitting: Family Medicine

## 2021-07-12 ENCOUNTER — Encounter: Payer: Self-pay | Admitting: Family Medicine

## 2021-07-12 DIAGNOSIS — K828 Other specified diseases of gallbladder: Secondary | ICD-10-CM

## 2021-07-12 DIAGNOSIS — K760 Fatty (change of) liver, not elsewhere classified: Secondary | ICD-10-CM

## 2021-07-12 DIAGNOSIS — R17 Unspecified jaundice: Secondary | ICD-10-CM

## 2021-07-14 ENCOUNTER — Other Ambulatory Visit: Payer: Self-pay

## 2021-07-14 ENCOUNTER — Encounter (HOSPITAL_COMMUNITY): Payer: Self-pay | Admitting: Emergency Medicine

## 2021-07-14 ENCOUNTER — Inpatient Hospital Stay (HOSPITAL_COMMUNITY)
Admission: EM | Admit: 2021-07-14 | Discharge: 2021-07-17 | DRG: 417 | Disposition: A | Discharge status: Home / Self Care | Payer: BC Managed Care – PPO | Attending: Internal Medicine | Admitting: Internal Medicine

## 2021-07-14 ENCOUNTER — Inpatient Hospital Stay (HOSPITAL_COMMUNITY): Payer: BC Managed Care – PPO

## 2021-07-14 ENCOUNTER — Emergency Department (HOSPITAL_COMMUNITY): Payer: BC Managed Care – PPO

## 2021-07-14 DIAGNOSIS — Z833 Family history of diabetes mellitus: Secondary | ICD-10-CM

## 2021-07-14 DIAGNOSIS — K859 Acute pancreatitis without necrosis or infection, unspecified: Secondary | ICD-10-CM | POA: Diagnosis present

## 2021-07-14 DIAGNOSIS — K8042 Calculus of bile duct with acute cholecystitis without obstruction: Secondary | ICD-10-CM | POA: Diagnosis not present

## 2021-07-14 DIAGNOSIS — Z888 Allergy status to other drugs, medicaments and biological substances status: Secondary | ICD-10-CM | POA: Diagnosis not present

## 2021-07-14 DIAGNOSIS — Z20822 Contact with and (suspected) exposure to covid-19: Secondary | ICD-10-CM | POA: Clinically undetermined

## 2021-07-14 DIAGNOSIS — E785 Hyperlipidemia, unspecified: Secondary | ICD-10-CM | POA: Diagnosis present

## 2021-07-14 DIAGNOSIS — K660 Peritoneal adhesions (postprocedural) (postinfection): Secondary | ICD-10-CM | POA: Diagnosis present

## 2021-07-14 DIAGNOSIS — Z7984 Long term (current) use of oral hypoglycemic drugs: Secondary | ICD-10-CM

## 2021-07-14 DIAGNOSIS — Z79899 Other long term (current) drug therapy: Secondary | ICD-10-CM

## 2021-07-14 DIAGNOSIS — K760 Fatty (change of) liver, not elsewhere classified: Secondary | ICD-10-CM | POA: Diagnosis present

## 2021-07-14 DIAGNOSIS — K8309 Other cholangitis: Secondary | ICD-10-CM

## 2021-07-14 DIAGNOSIS — I10 Essential (primary) hypertension: Secondary | ICD-10-CM | POA: Diagnosis present

## 2021-07-14 DIAGNOSIS — K8050 Calculus of bile duct without cholangitis or cholecystitis without obstruction: Secondary | ICD-10-CM | POA: Insufficient documentation

## 2021-07-14 DIAGNOSIS — E871 Hypo-osmolality and hyponatremia: Secondary | ICD-10-CM | POA: Diagnosis present

## 2021-07-14 DIAGNOSIS — K8067 Calculus of gallbladder and bile duct with acute and chronic cholecystitis with obstruction: Principal | ICD-10-CM | POA: Diagnosis present

## 2021-07-14 DIAGNOSIS — K219 Gastro-esophageal reflux disease without esophagitis: Secondary | ICD-10-CM | POA: Diagnosis present

## 2021-07-14 DIAGNOSIS — E872 Acidosis, unspecified: Secondary | ICD-10-CM | POA: Diagnosis present

## 2021-07-14 DIAGNOSIS — E1165 Type 2 diabetes mellitus with hyperglycemia: Secondary | ICD-10-CM | POA: Diagnosis present

## 2021-07-14 DIAGNOSIS — Z794 Long term (current) use of insulin: Secondary | ICD-10-CM

## 2021-07-14 DIAGNOSIS — Z800 Family history of malignant neoplasm of digestive organs: Secondary | ICD-10-CM

## 2021-07-14 DIAGNOSIS — Z6831 Body mass index (BMI) 31.0-31.9, adult: Secondary | ICD-10-CM

## 2021-07-14 DIAGNOSIS — E669 Obesity, unspecified: Secondary | ICD-10-CM | POA: Diagnosis present

## 2021-07-14 DIAGNOSIS — Z419 Encounter for procedure for purposes other than remedying health state, unspecified: Secondary | ICD-10-CM

## 2021-07-14 LAB — CBC
HCT: 47.9 % (ref 39.0–52.0)
Hemoglobin: 15.1 g/dL (ref 13.0–17.0)
MCH: 27.4 pg (ref 26.0–34.0)
MCHC: 31.5 g/dL (ref 30.0–36.0)
MCV: 86.9 fL (ref 80.0–100.0)
Platelets: 205 10*3/uL (ref 150–400)
RBC: 5.51 MIL/uL (ref 4.22–5.81)
RDW: 13.4 % (ref 11.5–15.5)
WBC: 14.6 10*3/uL — ABNORMAL HIGH (ref 4.0–10.5)
nRBC: 0 % (ref 0.0–0.2)

## 2021-07-14 LAB — COMPREHENSIVE METABOLIC PANEL
ALT: 188 U/L — ABNORMAL HIGH (ref 0–44)
AST: 243 U/L — ABNORMAL HIGH (ref 15–41)
Albumin: 4 g/dL (ref 3.5–5.0)
Alkaline Phosphatase: 135 U/L — ABNORMAL HIGH (ref 38–126)
Anion gap: 10 (ref 5–15)
BUN: 12 mg/dL (ref 6–20)
CO2: 25 mmol/L (ref 22–32)
Calcium: 9.2 mg/dL (ref 8.9–10.3)
Chloride: 99 mmol/L (ref 98–111)
Creatinine, Ser: 1.16 mg/dL (ref 0.61–1.24)
GFR, Estimated: >60 mL/min (ref >60)
Glucose, Bld: 186 mg/dL — ABNORMAL HIGH (ref 70–99)
Potassium: 4.4 mmol/L (ref 3.5–5.1)
Sodium: 134 mmol/L — ABNORMAL LOW (ref 135–145)
Total Bilirubin: 4.4 mg/dL — ABNORMAL HIGH (ref 0.3–1.2)
Total Protein: 7.1 g/dL (ref 6.5–8.1)

## 2021-07-14 LAB — URINALYSIS, ROUTINE W REFLEX MICROSCOPIC
Bacteria, UA: NONE SEEN
Bilirubin Urine: NEGATIVE
Glucose, UA: >=500 mg/dL — AB
Hgb urine dipstick: NEGATIVE
Ketones, ur: 20 mg/dL — AB
Leukocytes,Ua: NEGATIVE
Nitrite: NEGATIVE
Protein, ur: NEGATIVE mg/dL
Specific Gravity, Urine: 1.032 — ABNORMAL HIGH (ref 1.005–1.030)
pH: 5 (ref 5.0–8.0)

## 2021-07-14 LAB — RESP PANEL BY RT-PCR (FLU A&B, COVID) ARPGX2
Influenza A by PCR: NEGATIVE
Influenza B by PCR: NEGATIVE
SARS Coronavirus 2 by RT PCR: NEGATIVE

## 2021-07-14 LAB — CBG MONITORING, ED
Glucose-Capillary: 119 mg/dL — ABNORMAL HIGH (ref 70–99)
Glucose-Capillary: 96 mg/dL (ref 70–99)

## 2021-07-14 LAB — LACTIC ACID, PLASMA: Lactic Acid, Venous: 1 mmol/L (ref 0.5–1.9)

## 2021-07-14 LAB — LIPASE, BLOOD: Lipase: 102 U/L — ABNORMAL HIGH (ref 11–51)

## 2021-07-14 LAB — HIV ANTIBODY (ROUTINE TESTING W REFLEX): HIV Screen 4th Generation wRfx: NONREACTIVE

## 2021-07-14 MED ORDER — PIPERACILLIN-TAZOBACTAM 3.375 G IVPB
3.3750 g | Freq: Three times a day (TID) | INTRAVENOUS | Status: DC
  Administered 2021-07-14 – 2021-07-17 (×8): 3.375 g via INTRAVENOUS
  Filled 2021-07-14 (×9): qty 50

## 2021-07-14 MED ORDER — ENOXAPARIN SODIUM 40 MG/0.4ML IJ SOSY
40.0000 mg | PREFILLED_SYRINGE | INTRAMUSCULAR | Status: DC
  Administered 2021-07-14: 40 mg via SUBCUTANEOUS
  Filled 2021-07-14: qty 0.4

## 2021-07-14 MED ORDER — LABETALOL HCL 5 MG/ML IV SOLN: 10.0000 mg | Freq: Four times a day (QID) | INTRAVENOUS | Status: DC | PRN

## 2021-07-14 MED ORDER — LORAZEPAM 0.5 MG PO TABS
0.5000 mg | ORAL_TABLET | Freq: Four times a day (QID) | ORAL | Status: DC | PRN
Start: 2021-07-14 — End: 2021-07-17

## 2021-07-14 MED ORDER — HYDROMORPHONE HCL 1 MG/ML IJ SOLN: 0.5000 mg | INTRAMUSCULAR | Status: DC | PRN

## 2021-07-14 MED ORDER — PIPERACILLIN-TAZOBACTAM 3.375 G IVPB 30 MIN
3.3750 g | Freq: Once | INTRAVENOUS | Status: AC
  Administered 2021-07-14: 3.375 g via INTRAVENOUS
  Filled 2021-07-14: qty 50

## 2021-07-14 MED ORDER — SODIUM CHLORIDE 0.9 % IV BOLUS
1000.0000 mL | Freq: Once | INTRAVENOUS | Status: AC
  Administered 2021-07-14: 1000 mL via INTRAVENOUS

## 2021-07-14 MED ORDER — ONDANSETRON HCL 4 MG PO TABS: 4.0000 mg | ORAL_TABLET | Freq: Four times a day (QID) | ORAL | Status: DC | PRN

## 2021-07-14 MED ORDER — ESCITALOPRAM OXALATE 10 MG PO TABS
10.0000 mg | ORAL_TABLET | Freq: Every day | ORAL | Status: DC
  Administered 2021-07-14: 10 mg via ORAL
  Filled 2021-07-14: qty 1

## 2021-07-14 MED ORDER — PANTOPRAZOLE SODIUM 40 MG PO TBEC
40.0000 mg | DELAYED_RELEASE_TABLET | Freq: Every day | ORAL | Status: DC
  Administered 2021-07-14 – 2021-07-16 (×3): 40 mg via ORAL
  Filled 2021-07-14 (×3): qty 1

## 2021-07-14 MED ORDER — ONDANSETRON 4 MG PO TBDP
4.0000 mg | ORAL_TABLET | Freq: Once | ORAL | Status: AC | PRN
  Administered 2021-07-14: 4 mg via ORAL
  Filled 2021-07-14: qty 1

## 2021-07-14 MED ORDER — SODIUM CHLORIDE 0.9 % IV SOLN: INTRAVENOUS | Status: AC

## 2021-07-14 MED ORDER — ONDANSETRON HCL 4 MG/2ML IJ SOLN
4.0000 mg | Freq: Four times a day (QID) | INTRAMUSCULAR | Status: DC | PRN
  Administered 2021-07-16 (×2): 4 mg via INTRAVENOUS
  Filled 2021-07-14 (×2): qty 2

## 2021-07-14 MED ORDER — INSULIN ASPART 100 UNIT/ML IJ SOLN: 0.0000 [IU] | Freq: Three times a day (TID) | INTRAMUSCULAR | Status: DC

## 2021-07-14 MED ORDER — IOHEXOL 350 MG/ML SOLN
100.0000 mL | Freq: Once | INTRAVENOUS | Status: AC | PRN
  Administered 2021-07-14: 100 mL via INTRAVENOUS

## 2021-07-14 MED ORDER — KETOROLAC TROMETHAMINE 30 MG/ML IJ SOLN
30.0000 mg | Freq: Four times a day (QID) | INTRAMUSCULAR | Status: DC | PRN
  Administered 2021-07-17: 30 mg via INTRAVENOUS
  Filled 2021-07-14: qty 1

## 2021-07-14 MED ORDER — ACETAMINOPHEN 325 MG PO TABS
650.0000 mg | ORAL_TABLET | Freq: Once | ORAL | Status: AC | PRN
  Administered 2021-07-14: 650 mg via ORAL
  Filled 2021-07-14: qty 2

## 2021-07-14 NOTE — Progress Notes (Signed)
Pharmacy Antibiotic Note  Omar Travis is a 55 y.o. male admitted on 07/14/2021 with  intra-abdominal infection .  Pharmacy has been consulted for zosyn dosing.  Patient is a 67 yom with a history of DM, GERD, HTN, HLD. Patient presenting with abdominal pain.  SCr 1.16 - at baseline WBC 14.6; LA 1; T 101.2 F  Plan: Zosyn 3.375g IV q8h (4 hour infusion) Trend WBC, Fever, Renal function, & Clinical course F/u cultures Levels at steady state De-escalate when able     Temp (24hrs), Avg:100.5 F (38.1 C), Min:99.8 F (37.7 C), Max:101.2 F (38.4 C)  Recent Labs  Lab 07/08/21 1155 07/14/21 0735 07/14/21 0939  WBC 8.5 14.6*  --   CREATININE 1.23 1.16  --   LATICACIDVEN  --   --  1.0    CrCl cannot be calculated (Unknown ideal weight.).    Allergies  Allergen Reactions   Flexeril [Cyclobenzaprine]     Causes "breakout"    Antimicrobials this admission: zosyn 10/23 >>   Microbiology results: Pending  Thank you for allowing pharmacy to be a part of this patient's care.  Lorelei Pont, PharmD, BCPS 07/14/2021 2:08 PM ED Clinical Pharmacist -  [phone]

## 2021-07-14 NOTE — ED Notes (Signed)
Patient transported to MRI 

## 2021-07-14 NOTE — Consult Note (Signed)
Reason for Consult:gallstones Referring Physician: [name] is an 55 y.o. male.  HPI:  Pt is a 55 yo M who presents with abdominal pain.  He was seen by his PCP a week ago and an ultrasound was ordered which showed likely stones.  He came to the ED last night due to fever, jaundice, nausea, vomiting and increased epigastric/RUQ pain.  He has not had any appetite over the last few days.  He denies prolonged milder symptoms.    He denies prior abdominal surgery.  His sister required cholecystectomy. He is accompanied by his wife.  Past Medical History:  Diagnosis Date   Diabetes ([place])    GERD (gastroesophageal reflux disease)    HTN (hypertension)    Hyperlipidemia     History reviewed. No pertinent surgical history.  Family History  Problem Relation Age of Onset   Diabetes Mother    Liver cancer Father    Diabetes Father     Social History:  reports that he has never smoked. He has never used smokeless tobacco. He reports current alcohol use. He reports that he does not use drugs.  Allergies:  Allergies  Allergen Reactions   Flexeril [Cyclobenzaprine]     Causes "breakout"    Medications:  Outpatient Medications canagliflozin (INVOKANA) 300 MG TABS tablet escitalopram (LEXAPRO) 10 MG tablet fenofibrate (TRICOR) 145 MG tablet insulin glargine, 1 Unit Dial, (TOUJEO SOLOSTAR) 300 UNIT/ML Solostar Pen lisinopril (ZESTRIL) 5 MG tablet omeprazole (PRILOSEC) 20 MG capsule rosuvastatin (CRESTOR) 5 MG tablet Semaglutide (OZEMPIC, 1 MG/DOSE, [place])  Results for orders placed or performed during the hospital encounter of 07/14/21 (from the past 48 hour(s))  Lipase, blood     Status: Abnormal   Collection Time: 07/14/21  7:35 AM  Result Value Ref Range   Lipase 102 (H) 11 - 51 U/L    Comment: Performed at [facility], [address] [place], [place] [address]  Comprehensive metabolic panel     Status: Abnormal   Collection Time: 07/14/21  7:35 AM   Result Value Ref Range   Sodium 134 (L) 135 - 145 mmol/L   Potassium 4.4 3.5 - 5.1 mmol/L   Chloride 99 98 - 111 mmol/L   CO2 25 22 - 32 mmol/L   Glucose, Bld 186 (H) 70 - 99 mg/dL    Comment: Glucose reference range applies only to samples taken after fasting for at least 8 hours.   BUN 12 6 - 20 mg/dL   Creatinine, Ser 1.16 0.61 - 1.24 mg/dL   Calcium 9.2 8.9 - 10.3 mg/dL   Total Protein 7.1 6.5 - 8.1 g/dL   Albumin 4.0 3.5 - 5.0 g/dL   AST 243 (H) 15 - 41 U/L   ALT 188 (H) 0 - 44 U/L   Alkaline Phosphatase 135 (H) 38 - 126 U/L   Total Bilirubin 4.4 (H) 0.3 - 1.2 mg/dL   GFR, Estimated >60 >60 mL/min    Comment: (NOTE) Calculated using the CKD-EPI Creatinine Equation (2021)    Anion gap 10 5 - 15    Comment: Performed at [facility], [place] [address], [place] [address]  CBC     Status: Abnormal   Collection Time: 07/14/21  7:35 AM  Result Value Ref Range   WBC 14.6 (H) 4.0 - 10.5 K/uL   RBC 5.51 4.22 - 5.81 MIL/uL   Hemoglobin 15.1 13.0 - 17.0 g/dL   HCT 47.9 39.0 - 52.0 %  MCV 86.9 80.0 - 100.0 fL   MCH 27.4 26.0 - 34.0 pg   MCHC 31.5 30.0 - 36.0 g/dL   RDW 13.4 11.5 - 15.5 %   Platelets 205 150 - 400 K/uL   nRBC 0.0 0.0 - 0.2 %    Comment: Performed at [facility] [address] [place], [place]  Urinalysis, Routine w reflex microscopic Urine, Clean Catch     Status: Abnormal   Collection Time: 07/14/21  7:35 AM  Result Value Ref Range   Color, Urine YELLOW YELLOW   APPearance HAZY (A) CLEAR   Specific Gravity, Urine 1.032 (H) 1.005 - 1.030   pH 5.0 5.0 - 8.0   Glucose, UA >=500 (A) NEGATIVE mg/dL   Hgb urine dipstick NEGATIVE NEGATIVE   Bilirubin Urine NEGATIVE NEGATIVE   Ketones, ur 20 (A) NEGATIVE mg/dL   Protein, ur NEGATIVE NEGATIVE mg/dL   Nitrite NEGATIVE NEGATIVE   Leukocytes,Ua NEGATIVE NEGATIVE   RBC / HPF 6-10 0 - 5 RBC/hpf   WBC, UA 0-5 0 - 5 WBC/hpf   Bacteria, UA NONE SEEN NONE SEEN   Squamous Epithelial / LPF  0-5 0 - 5   Mucus PRESENT    Non Squamous Epithelial 0-5 (A) NONE SEEN    Comment: Performed at [facility], [place] [address], [place], [place]  Lactic acid, plasma     Status: None   Collection Time: 07/14/21  9:39 AM  Result Value Ref Range   Lactic Acid, Venous 1.0 0.5 - 1.9 mmol/L    Comment: Performed at [place] [address] [place], [place] [address]    CT ABDOMEN PELVIS W CONTRAST  Result Date: 07/14/2021 CLINICAL DATA:  Abdominal pain, acute, nonlocalized suspect choledocholithiasis EXAM: CT ABDOMEN AND PELVIS WITH CONTRAST TECHNIQUE: Multidetector CT imaging of the abdomen and pelvis was performed using the standard protocol following bolus administration of intravenous contrast. CONTRAST:  181mL OMNIPAQUE IOHEXOL 350 MG/ML SOLN COMPARISON:  August 19, 2017 FINDINGS: Lower chest: No acute abnormality. Hepatobiliary: Revisualization of intrahepatic biliary ductal dilation of predominately hepatic segment 6. This is similar in comparison to prior MRI. Unchanged appearance of the common bile duct. Gallbladder is completely decompressed. Cholelithiasis. There is adjacent haziness of the fat along the gallbladder. Hepatic steatosis. Pancreas: Unremarkable. No pancreatic ductal dilatation or surrounding inflammatory changes. Spleen: Unchanged mild splenomegaly. Adrenals/Urinary Tract: Adrenal glands are unremarkable. No hydronephrosis. Parapelvic cysts. Kidneys enhance symmetrically. No obstructing nephrolithiasis. Subcentimeter hypodense mass of the LEFT kidney is too small to accurately characterize. Bladder is unremarkable. Stomach/Bowel: No evidence of bowel obstruction. Appendix is normal. Moderate colonic stool burden diffusely throughout the colon. Misty mesentery, nonspecific. Vascular/Lymphatic: No significant vascular findings are present. No enlarged retroperitoneal lymph nodes by size criteria. There are scattered mesenteric lymph nodes which are mildly  prominent with representative prominent mesenteric lymph node measuring 8 mm in the short axis (series 3, image 38). Reproductive: Prostate is present. Other: No free air or free fluid. Musculoskeletal: No acute or significant osseous findings. IMPRESSION: 1. The gallbladder is decompressed around a cholelithiasis. However, there is haziness of the adjacent fat with subjective wall thickening of the gallbladder. Findings could reflect acute on chronic cholecystitis. No choledocholithiasis visualized. 2. Similar appearance of intrahepatic biliary ductal dilation of predominately hepatic segment 6. 3. Hepatic steatosis. 4. Misty mesentery, nonspecific Electronically Signed   By: Valentino Saxon M.D.   On: 07/14/2021 12:46    Review of Systems  Constitutional:  Positive for fever.  HENT: Negative.    Eyes: Negative.   Respiratory: Negative.    Cardiovascular: Negative.   Gastrointestinal:  Positive for abdominal pain, nausea and vomiting.  Endocrine: Negative.   Genitourinary: Negative.   Musculoskeletal: Negative.   Skin:  Positive for color change (jaundice).  Allergic/Immunologic: Negative.   Neurological: Negative.   Hematological: Negative.   Psychiatric/Behavioral: Negative.    All other systems reviewed and are negative.  Blood pressure 115/72, pulse (!) 123, temperature 99.8 F (37.7 C), temperature source Oral, resp. rate 19, SpO2 96 %. Physical Exam Vitals reviewed.  Constitutional:      General: He is in acute distress (looks uncomfortable).     Appearance: He is well-developed and normal weight. He is not toxic-appearing or diaphoretic.  HENT:     Head: Normocephalic and atraumatic.     Mouth/Throat:     Mouth: Mucous membranes are moist.  Eyes:     General: Scleral icterus present.     Extraocular Movements: Extraocular movements intact.     Pupils: Pupils are equal, round, and reactive to light.  Cardiovascular:     Rate and Rhythm: Normal rate and regular rhythm.   Pulmonary:     Effort: Pulmonary effort is normal. No respiratory distress.     Breath sounds: No wheezing.  Abdominal:     General: There is distension (mild). There are no signs of injury.     Palpations: Abdomen is soft. There is no shifting dullness, fluid wave, hepatomegaly, splenomegaly, mass or pulsatile mass.     Tenderness: There is abdominal tenderness in the right upper quadrant and epigastric area. There is no right CVA tenderness, guarding or rebound. Positive signs include Murphy's sign. Negative signs include Rovsing's sign, McBurney's sign and obturator sign.     Hernia: No hernia is present.  Skin:    General: Skin is warm and dry.     Capillary Refill: Capillary refill takes 2 to 3 seconds.     Coloration: Skin is jaundiced. Skin is not cyanotic, mottled or pale.     Findings: No erythema or rash.  Neurological:     General: No focal deficit present.     Mental Status: He is alert and oriented to person, place, and time.     Cranial Nerves: No cranial nerve deficit.     Motor: No weakness.  Psychiatric:        Mood and Affect: Mood normal. Mood is not anxious or depressed.        Behavior: Behavior normal.    Assessment/Plan: Acute cholecystitis Jaundice Pancreatitis Possible choledocholithiasis Recommend admit with IV antibiotics, NPO, MRI, GI consult, and eventually surgery.  Will need lap chole +/- IOC when appropriate.     Omar Travis 07/14/2021, 1:26 PM

## 2021-07-14 NOTE — ED Triage Notes (Signed)
Per wife pt was jaundice last week for a few days.  Seen by PCP and had [place] at [employer].  States he was diagnosed with fatty liver and has a HIDA scan ordered.  Was told by PCP to go to ED if jaundice, fever, or abd pain.  Pt reports RUQ pain since last night, nausea, vomiting, and fever this morning.

## 2021-07-14 NOTE — H&P (Signed)
History and Physical    Omar Travis PYP:[id number] DOB: 11/19/1965 DOA: 07/14/2021  PCP: Billie Ruddy, MD (Confirm with patient/family/NH records and if not entered, this has to be entered at [facility] point of entry) Patient coming from: [facility]  I have personally briefly reviewed patient's old medical records in [place]  Chief Complaint: RUQ pain, turned yellow  HPI: Omar Travis is a 55 y.o. male with medical history significant of liver stricture (chronic intrahepatic bile duct dilation following GI at [facility]), IDDM, HTN, HLD, came with worsening of RUQ abdominal pain and jaundice.  Symptoms started Monday, developed acute onset RUQ pain which was noticed by her wife, went to see PCP, outpatient LFTs showed normal liver enzymes and bilirubin level, RUQ ultrasound which showed small stones within gallbladder.  And patient abdominal pain resolved next day.  His outpatient HIDA scan was ordered but not done.  However yesterday, patient developed again severe RUQ pain, and "skin turned yellow", abd pain worsening with eating, nauseous vomiting of stomach content no bile no blood.  No diarrhea.  No fever or chills at home.  ED Course: Patient spiked fever about 101.2, blood work showed elevated liver enzymes study called bilirubin 4.4, AST 240 , ALT 188, WBC 14.6.  CT abdomen pelvis, compatible with acute on chronic cholecystitis no choledocholithiasis visualized, similar appearance of intrahepatic.  Duct dilation of hepatic segment.  Zosyn was given, GI and general surgeon consulted.  Review of Systems: As per HPI otherwise 14 point review of systems negative.    Past Medical History:  Diagnosis Date   Diabetes ([place])    GERD (gastroesophageal reflux disease)    HTN (hypertension)    Hyperlipidemia     History reviewed. No pertinent surgical history.   reports that he has never smoked. He has never used smokeless tobacco. He reports current alcohol use. He reports that he  does not use drugs.  Allergies  Allergen Reactions   Flexeril [Cyclobenzaprine]     Causes "breakout"    Family History  Problem Relation Age of Onset   Diabetes Mother    Liver cancer Father    Diabetes Father      Prior to Admission medications   Medication Sig Start Date End Date Taking? Authorizing Provider  canagliflozin (INVOKANA) 300 MG TABS tablet Take 300 mg by mouth daily before breakfast.    [provider]  escitalopram (LEXAPRO) 10 MG tablet TAKE ONE (1) TABLET EACH DAY 06/12/21   Billie Ruddy, MD  fenofibrate (TRICOR) 145 MG tablet Take 145 mg by mouth daily.    [provider]  insulin glargine, 1 Unit Dial, (TOUJEO SOLOSTAR) 300 UNIT/ML Solostar Pen Inject 54 Units into the skin daily. 01/16/20   Billie Ruddy, MD  lisinopril (ZESTRIL) 5 MG tablet TAKE ONE (1) TABLET EACH DAY 03/26/21   Billie Ruddy, MD  omeprazole (PRILOSEC) 20 MG capsule Take 1 capsule (20 mg total) by mouth daily. 06/25/20   Billie Ruddy, MD  rosuvastatin (CRESTOR) 5 MG tablet Take 5 mg by mouth daily.    [provider]  Semaglutide (OZEMPIC, 1 MG/DOSE, [place]) Inject 1 mg into the skin once a week.    [provider]    Physical Exam: Vitals:   07/14/21 1145 07/14/21 1158 07/14/21 1408 07/14/21 1409  BP: 115/72  115/76   Pulse: (!) 123  (!) 112 (!) 122  Resp: 19  20   Temp:  99.8 F (37.7 C)  TempSrc:  Oral    SpO2: 96%  99% 98%    Constitutional: NAD, calm, comfortable Vitals:   07/14/21 1145 07/14/21 1158 07/14/21 1408 07/14/21 1409  BP: 115/72  115/76   Pulse: (!) 123  (!) 112 (!) 122  Resp: 19  20   Temp:  99.8 F (37.7 C)    TempSrc:  Oral    SpO2: 96%  99% 98%   Eyes: PERRL, lids and conjunctivae normal, conjunctival icterus ENMT: Mucous membranes are moist. Posterior pharynx clear of any exudate or lesions.Normal dentition.  Neck: normal, supple, no masses, no thyromegaly Respiratory: clear to auscultation bilaterally, no  wheezing, no crackles. Normal respiratory effort. No accessory muscle use.  Cardiovascular: Regular rate and rhythm, no murmurs / rubs / gallops. No extremity edema. 2+ pedal pulses. No carotid bruits.  Abdomen: RUQ tenderness no rebound no guarding, no masses palpated. No hepatosplenomegaly. Bowel sounds positive.  Musculoskeletal: no clubbing / cyanosis. No joint deformity upper and lower extremities. Good ROM, no contractures. Normal muscle tone.  Skin: no rashes, lesions, ulcers. No induration Neurologic: CN 2-12 grossly intact. Sensation intact, DTR normal. Strength 5/5 in all 4.  Psychiatric: Normal judgment and insight. Alert and oriented x 3. Normal mood.     Labs on Admission: I have personally reviewed following labs and imaging studies  CBC: Recent Labs  Lab 07/08/21 1155 07/14/21 0735  WBC 8.5 14.6*  NEUTROABS 6.2  --   HGB 14.9 15.1  HCT 46.0 47.9  MCV 84.6 86.9  PLT 205.0 956   Basic Metabolic Panel: Recent Labs  Lab 07/08/21 1155 07/14/21 0735  NA 133* 134*  K 4.7 4.4  CL 98 99  CO2 29 25  GLUCOSE 282* 186*  BUN 15 12  CREATININE 1.23 1.16  CALCIUM 9.7 9.2   GFR: CrCl cannot be calculated (Unknown ideal weight.). Liver Function Tests: Recent Labs  Lab 07/08/21 1155 07/14/21 0735  AST 30 243*  ALT 58* 188*  ALKPHOS 151* 135*  BILITOT 0.9 4.4*  PROT 7.4 7.1  ALBUMIN 4.4 4.0   Recent Labs  Lab 07/14/21 0735  LIPASE 102*   No results for input(s): AMMONIA in the last 168 hours. Coagulation Profile: No results for input(s): INR, PROTIME in the last 168 hours. Cardiac Enzymes: No results for input(s): CKTOTAL, CKMB, CKMBINDEX, TROPONINI in the last 168 hours. BNP (last 3 results) No results for input(s): PROBNP in the last 8760 hours. HbA1C: No results for input(s): HGBA1C in the last 72 hours. CBG: No results for input(s): GLUCAP in the last 168 hours. Lipid Profile: No results for input(s): CHOL, HDL, LDLCALC, TRIG, CHOLHDL, LDLDIRECT in  the last 72 hours. Thyroid Function Tests: No results for input(s): TSH, T4TOTAL, FREET4, T3FREE, THYROIDAB in the last 72 hours. Anemia Panel: No results for input(s): VITAMINB12, FOLATE, FERRITIN, TIBC, IRON, RETICCTPCT in the last 72 hours. Urine analysis:    Component Value Date/Time   COLORURINE YELLOW 07/14/2021 0735   APPEARANCEUR HAZY (A) 07/14/2021 0735   LABSPEC 1.032 (H) 07/14/2021 0735   PHURINE 5.0 07/14/2021 0735   GLUCOSEU >=500 (A) 07/14/2021 0735   HGBUR NEGATIVE 07/14/2021 0735   BILIRUBINUR NEGATIVE 07/14/2021 0735   KETONESUR 20 (A) 07/14/2021 0735   PROTEINUR NEGATIVE 07/14/2021 0735   NITRITE NEGATIVE 07/14/2021 0735   LEUKOCYTESUR NEGATIVE 07/14/2021 0735    Radiological Exams on Admission: CT ABDOMEN PELVIS W CONTRAST  Result Date: 07/14/2021 CLINICAL DATA:  Abdominal pain, acute, nonlocalized suspect choledocholithiasis EXAM: CT ABDOMEN AND  PELVIS WITH CONTRAST TECHNIQUE: Multidetector CT imaging of the abdomen and pelvis was performed using the standard protocol following bolus administration of intravenous contrast. CONTRAST:  144mL OMNIPAQUE IOHEXOL 350 MG/ML SOLN COMPARISON:  August 19, 2017 FINDINGS: Lower chest: No acute abnormality. Hepatobiliary: Revisualization of intrahepatic biliary ductal dilation of predominately hepatic segment 6. This is similar in comparison to prior MRI. Unchanged appearance of the common bile duct. Gallbladder is completely decompressed. Cholelithiasis. There is adjacent haziness of the fat along the gallbladder. Hepatic steatosis. Pancreas: Unremarkable. No pancreatic ductal dilatation or surrounding inflammatory changes. Spleen: Unchanged mild splenomegaly. Adrenals/Urinary Tract: Adrenal glands are unremarkable. No hydronephrosis. Parapelvic cysts. Kidneys enhance symmetrically. No obstructing nephrolithiasis. Subcentimeter hypodense mass of the LEFT kidney is too small to accurately characterize. Bladder is unremarkable.  Stomach/Bowel: No evidence of bowel obstruction. Appendix is normal. Moderate colonic stool burden diffusely throughout the colon. Misty mesentery, nonspecific. Vascular/Lymphatic: No significant vascular findings are present. No enlarged retroperitoneal lymph nodes by size criteria. There are scattered mesenteric lymph nodes which are mildly prominent with representative prominent mesenteric lymph node measuring 8 mm in the short axis (series 3, image 38). Reproductive: Prostate is present. Other: No free air or free fluid. Musculoskeletal: No acute or significant osseous findings. IMPRESSION: 1. The gallbladder is decompressed around a cholelithiasis. However, there is haziness of the adjacent fat with subjective wall thickening of the gallbladder. Findings could reflect acute on chronic cholecystitis. No choledocholithiasis visualized. 2. Similar appearance of intrahepatic biliary ductal dilation of predominately hepatic segment 6. 3. Hepatic steatosis. 4. Misty mesentery, nonspecific Electronically Signed   By: Valentino Saxon M.D.   On: 07/14/2021 12:46    EKG: Independently reviewed. Ordered  Assessment/Plan Active Problems:   Cholangitis  (please populate well all problems here in Problem List. (For example, if patient is on BP meds at home and you resume or decide to hold them, it is a problem that needs to be her. Same for CAD, COPD, HLD and so on)  Sepsis secondary to acute cholecystitis and acute cholangitis -Evidenced by tachycardia, elevated white count and a fever, source is acute cholecystitis/cholangitis. -Suspect CBD obstructions given new onset of jaundice and leukocytosis and elevated liver enzymes compared to 6 days ago when his symptoms were started. -Blood culture x2, continue Zosyn. IVF and pain meds. Liquid diet today if can tolerate. -MRCP, n.p.o. after midnight. -D/W [facility] Dr. Paulita Fujita. -General surgery consulted.  HTN -Hold home BP meds, until out of sepsis. -PRN  Hydralazine  IDDM -Sliding scale for now -Hold PO DM meds for now, until able to resume p.o.  HLD -Hold Statin.  Liver stricture -Chronic issue, MRCP to follow.  DVT prophylaxis: Lovenox Code Status: Full Code Family Communication: Wife at bedside Disposition Plan: Expect more than 2 midnight hospital stay, for ERCP and cholecystectomy Consults called: [facility] and general surgery Admission status: MedSurg with telemetry monitoring   Lequita Halt MD [facility] Pager [phone]  07/14/2021, 2:22 PM

## 2021-07-14 NOTE — ED Provider Notes (Signed)
[facility] EMERGENCY DEPARTMENT Provider Note   CSN: [id number] Arrival date & time: 07/14/21  4193     History Chief Complaint  Patient presents with   Abdominal Pain    Omar Travis is a 55 y.o. male.  Pt presents to the ED today with abdominal pain.  Pt's wife said pt was jaundiced last week.  He saw his pcp who drew labs and he had elevated LFTs.  Pt had an abdominal US which showed:  IMPRESSION: Fatty liver.   Question decompressed gallbladder with small stones within. No stones were noted on the prior MRI. HIDA scan may be helpful to assess gallbladder function.   His PCP ordered a HIDA scan which is scheduled for later this week.  Pt's wife said he's developed jaundice again and now has fever and vomiting.  Pt said his pain and nausea have improved.  Pt did have a fever and was given tylenol in triage.  Pt was given zofran in triage for his nausea.  Pt is also a diabetic and has not been able to take his meds.  He has not been eating either.      Past Medical History:  Diagnosis Date   Diabetes ([place])    GERD (gastroesophageal reflux disease)    HTN (hypertension)    Hyperlipidemia     Patient Active Problem List   Diagnosis Date Noted   Depression, recurrent ([place]) 11/24/2019   Gastroesophageal reflux disease 06/22/2019   Hyperlipidemia 06/22/2019   Diabetes mellitus ([place])    Essential hypertension     History reviewed. No pertinent surgical history.     Family History  Problem Relation Age of Onset   Diabetes Mother    Liver cancer Father    Diabetes Father     Social History   Tobacco Use   Smoking status: Never   Smokeless tobacco: Never  Substance Use Topics   Alcohol use: Yes   Drug use: Never    Home Medications Prior to Admission medications   Medication Sig Start Date End Date Taking? Authorizing Provider  canagliflozin (INVOKANA) 300 MG TABS tablet Take 300 mg by mouth daily before breakfast.    [provider]  escitalopram (LEXAPRO) 10 MG tablet TAKE ONE (1) TABLET EACH DAY 06/12/21   Billie Ruddy, MD  fenofibrate (TRICOR) 145 MG tablet Take 145 mg by mouth daily.    [provider]  insulin glargine, 1 Unit Dial, (TOUJEO SOLOSTAR) 300 UNIT/ML Solostar Pen Inject 54 Units into the skin daily. 01/16/20   Billie Ruddy, MD  lisinopril (ZESTRIL) 5 MG tablet TAKE ONE (1) TABLET EACH DAY 03/26/21   Billie Ruddy, MD  omeprazole (PRILOSEC) 20 MG capsule Take 1 capsule (20 mg total) by mouth daily. 06/25/20   Billie Ruddy, MD  rosuvastatin (CRESTOR) 5 MG tablet Take 5 mg by mouth daily.    [provider]  Semaglutide (OZEMPIC, 1 MG/DOSE, [place]) Inject 1 mg into the skin once a week.    [provider]    Allergies    Flexeril [cyclobenzaprine]  Review of Systems   Review of Systems  Constitutional:  Positive for fever.  Gastrointestinal:  Positive for abdominal pain, nausea and vomiting.   Physical Exam Updated Vital Signs BP 115/72   Pulse (!) 123   Temp 99.8 F (37.7 C) (Oral)   Resp 19   SpO2 96%   Physical Exam Vitals and nursing note reviewed.  Constitutional:  Appearance: He is well-developed.  HENT:     Head: Normocephalic and atraumatic.     Mouth/Throat:     Mouth: Mucous membranes are dry.  Eyes:     Extraocular Movements: Extraocular movements intact.     Pupils: Pupils are equal, round, and reactive to light.  Cardiovascular:     Rate and Rhythm: Regular rhythm. Tachycardia present.     Heart sounds: Normal heart sounds.  Pulmonary:     Effort: Pulmonary effort is normal.     Breath sounds: Normal breath sounds.  Abdominal:     General: Abdomen is flat. Bowel sounds are normal.     Palpations: Abdomen is soft.     Tenderness: There is abdominal tenderness in the right upper quadrant and epigastric area.  Skin:    General: Skin is warm.     Capillary Refill: Capillary refill takes less than 2 seconds.   Neurological:     General: No focal deficit present.     Mental Status: He is alert and oriented to person, place, and time.  Psychiatric:        Mood and Affect: Mood normal.        Behavior: Behavior normal.    ED Results / Procedures / Treatments   Labs (all labs ordered are listed, but only abnormal results are displayed) Labs Reviewed  LIPASE, BLOOD - Abnormal; Notable for the following components:      Result Value   Lipase 102 (*)    All other components within normal limits  COMPREHENSIVE METABOLIC PANEL - Abnormal; Notable for the following components:   Sodium 134 (*)    Glucose, Bld 186 (*)    AST 243 (*)    ALT 188 (*)    Alkaline Phosphatase 135 (*)    Total Bilirubin 4.4 (*)    All other components within normal limits  CBC - Abnormal; Notable for the following components:   WBC 14.6 (*)    All other components within normal limits  URINALYSIS, ROUTINE W REFLEX MICROSCOPIC - Abnormal; Notable for the following components:   APPearance HAZY (*)    Specific Gravity, Urine 1.032 (*)    Glucose, UA >=500 (*)    Ketones, ur 20 (*)    Non Squamous Epithelial 0-5 (*)    All other components within normal limits  LACTIC ACID, PLASMA  LACTIC ACID, PLASMA    EKG None  Radiology CT ABDOMEN PELVIS W CONTRAST  Result Date: 07/14/2021 CLINICAL DATA:  Abdominal pain, acute, nonlocalized suspect choledocholithiasis EXAM: CT ABDOMEN AND PELVIS WITH CONTRAST TECHNIQUE: Multidetector CT imaging of the abdomen and pelvis was performed using the standard protocol following bolus administration of intravenous contrast. CONTRAST:  172mL OMNIPAQUE IOHEXOL 350 MG/ML SOLN COMPARISON:  August 19, 2017 FINDINGS: Lower chest: No acute abnormality. Hepatobiliary: Revisualization of intrahepatic biliary ductal dilation of predominately hepatic segment 6. This is similar in comparison to prior MRI. Unchanged appearance of the common bile duct. Gallbladder is completely decompressed.  Cholelithiasis. There is adjacent haziness of the fat along the gallbladder. Hepatic steatosis. Pancreas: Unremarkable. No pancreatic ductal dilatation or surrounding inflammatory changes. Spleen: Unchanged mild splenomegaly. Adrenals/Urinary Tract: Adrenal glands are unremarkable. No hydronephrosis. Parapelvic cysts. Kidneys enhance symmetrically. No obstructing nephrolithiasis. Subcentimeter hypodense mass of the LEFT kidney is too small to accurately characterize. Bladder is unremarkable. Stomach/Bowel: No evidence of bowel obstruction. Appendix is normal. Moderate colonic stool burden diffusely throughout the colon. Misty mesentery, nonspecific. Vascular/Lymphatic: No significant vascular findings are present. No  enlarged retroperitoneal lymph nodes by size criteria. There are scattered mesenteric lymph nodes which are mildly prominent with representative prominent mesenteric lymph node measuring 8 mm in the short axis (series 3, image 38). Reproductive: Prostate is present. Other: No free air or free fluid. Musculoskeletal: No acute or significant osseous findings. IMPRESSION: 1. The gallbladder is decompressed around a cholelithiasis. However, there is haziness of the adjacent fat with subjective wall thickening of the gallbladder. Findings could reflect acute on chronic cholecystitis. No choledocholithiasis visualized. 2. Similar appearance of intrahepatic biliary ductal dilation of predominately hepatic segment 6. 3. Hepatic steatosis. 4. Misty mesentery, nonspecific Electronically Signed   By: Valentino Saxon M.D.   On: 07/14/2021 12:46    Procedures Procedures   Medications Ordered in ED Medications  LORazepam (ATIVAN) tablet 0.5 mg (has no administration in time range)  acetaminophen (TYLENOL) tablet 650 mg (650 mg Oral Given 07/14/21 0756)  ondansetron (ZOFRAN-ODT) disintegrating tablet 4 mg (4 mg Oral Given 07/14/21 0756)  sodium chloride 0.9 % bolus 1,000 mL (1,000 mLs Intravenous New  Bag/Given (Non-Interop) 07/14/21 1158)  piperacillin-tazobactam (ZOSYN) IVPB 3.375 g (0 g Intravenous Stopped 07/14/21 1301)  iohexol (OMNIPAQUE) 350 MG/ML injection 100 mL (100 mLs Intravenous Contrast Given 07/14/21 1227)    ED Course  I have reviewed the triage vital signs and the nursing notes.  Pertinent labs & imaging results that were available during my care of the patient were reviewed by me and considered in my medical decision making (see chart for details).     MDM Rules/Calculators/A&P                           Pt is feeling better after meds.  Results of CT scan d/w Dr. Barry Dienes (surgery).  She recommends medicine admit and GI consult.  She will also see pt in consult.  Pt d/w Tye Savoy from [facility].  She will see pt in consult.    Pt d/w Dr. Roosevelt Locks ([facility]) who will admit.  Final Clinical Impression(s) / ED Diagnoses Final diagnoses:  Choledocholithiasis with acute cholecystitis    Rx / DC Orders ED Discharge Orders     None        Isla Pence, MD 07/14/21 1313

## 2021-07-15 DIAGNOSIS — K8309 Other cholangitis: Secondary | ICD-10-CM

## 2021-07-15 LAB — CBC WITH DIFFERENTIAL/PLATELET
Abs Immature Granulocytes: 0.04 10*3/uL (ref 0.00–0.07)
Basophils Absolute: 0 10*3/uL (ref 0.0–0.1)
Basophils Relative: 0 %
Eosinophils Absolute: 0 10*3/uL (ref 0.0–0.5)
Eosinophils Relative: 0 %
HCT: 39.9 % (ref 39.0–52.0)
Hemoglobin: 12.7 g/dL — ABNORMAL LOW (ref 13.0–17.0)
Immature Granulocytes: 1 %
Lymphocytes Relative: 15 %
Lymphs Abs: 1 10*3/uL (ref 0.7–4.0)
MCH: 27.8 pg (ref 26.0–34.0)
MCHC: 31.8 g/dL (ref 30.0–36.0)
MCV: 87.3 fL (ref 80.0–100.0)
Monocytes Absolute: 0.5 10*3/uL (ref 0.1–1.0)
Monocytes Relative: 7 %
Neutro Abs: 5.2 10*3/uL (ref 1.7–7.7)
Neutrophils Relative %: 77 %
Platelets: 128 10*3/uL — ABNORMAL LOW (ref 150–400)
RBC: 4.57 MIL/uL (ref 4.22–5.81)
RDW: 13.7 % (ref 11.5–15.5)
WBC: 6.8 10*3/uL (ref 4.0–10.5)
nRBC: 0 % (ref 0.0–0.2)

## 2021-07-15 LAB — CBG MONITORING, ED
Glucose-Capillary: 72 mg/dL (ref 70–99)
Glucose-Capillary: 69 mg/dL — ABNORMAL LOW (ref 70–99)
Glucose-Capillary: 89 mg/dL (ref 70–99)
Glucose-Capillary: 91 mg/dL (ref 70–99)

## 2021-07-15 LAB — COMPREHENSIVE METABOLIC PANEL
ALT: 257 U/L — ABNORMAL HIGH (ref 0–44)
AST: 219 U/L — ABNORMAL HIGH (ref 15–41)
Albumin: 3 g/dL — ABNORMAL LOW (ref 3.5–5.0)
Alkaline Phosphatase: 111 U/L (ref 38–126)
Anion gap: 14 (ref 5–15)
BUN: 13 mg/dL (ref 6–20)
CO2: 18 mmol/L — ABNORMAL LOW (ref 22–32)
Calcium: 8.3 mg/dL — ABNORMAL LOW (ref 8.9–10.3)
Chloride: 102 mmol/L (ref 98–111)
Creatinine, Ser: 1.08 mg/dL (ref 0.61–1.24)
GFR, Estimated: >60 mL/min (ref >60)
Glucose, Bld: 71 mg/dL (ref 70–99)
Potassium: 3.9 mmol/L (ref 3.5–5.1)
Sodium: 134 mmol/L — ABNORMAL LOW (ref 135–145)
Total Bilirubin: 6.3 mg/dL — ABNORMAL HIGH (ref 0.3–1.2)
Total Protein: 6 g/dL — ABNORMAL LOW (ref 6.5–8.1)

## 2021-07-15 LAB — GLUCOSE, CAPILLARY
Glucose-Capillary: 70 mg/dL (ref 70–99)
Glucose-Capillary: 163 mg/dL — ABNORMAL HIGH (ref 70–99)
Glucose-Capillary: 106 mg/dL — ABNORMAL HIGH (ref 70–99)

## 2021-07-15 LAB — PROTIME-INR
INR: 1.2 (ref 0.8–1.2)
Prothrombin Time: 14.7 seconds (ref 11.4–15.2)

## 2021-07-15 MED ORDER — SODIUM CHLORIDE 0.9 % IV SOLN: INTRAVENOUS | Status: DC

## 2021-07-15 MED ORDER — INSULIN ASPART 100 UNIT/ML IJ SOLN
0.0000 [IU] | INTRAMUSCULAR | Status: DC
  Administered 2021-07-15: 2 [IU] via SUBCUTANEOUS
  Administered 2021-07-16: 3 [IU] via SUBCUTANEOUS

## 2021-07-15 MED ORDER — ESCITALOPRAM OXALATE 10 MG PO TABS
10.0000 mg | ORAL_TABLET | Freq: Every day | ORAL | Status: DC
  Administered 2021-07-16: 10 mg via ORAL
  Filled 2021-07-15: qty 1

## 2021-07-15 NOTE — Progress Notes (Signed)
Inpatient Diabetes Program Recommendations  AACE/ADA: New Consensus Statement on Inpatient Glycemic Control (2015)  Target Ranges:  Prepandial:   less than 140 mg/dL      Peak postprandial:   less than 180 mg/dL (1-2 hours)      Critically ill patients:  140 - 180 mg/dL   Lab Results  Component Value Date   GLUCAP 69 (L) 07/15/2021   HGBA1C 9.2 (H) 07/08/2021    Review of Glycemic Control Results for LORENZ, DONLEY (MRN [id number]) as of 07/15/2021 12:44  Ref. Range 07/14/2021 18:31 07/14/2021 22:41 07/15/2021 08:10 07/15/2021 11:31  Glucose-Capillary Latest Ref Range: 70 - 99 mg/dL 119 (H) 96 72 69 (L)   Diabetes history: DM2 Outpatient Diabetes medications: Toujeo 54 units qd, Invokana 30 units qd, Ozempic 1 mg q week Current orders for Inpatient glycemic control: Novolog 0-20 units qid  Inpatient Diabetes Program Recommendations:   While pt. Is NPO, consider: -Change Novolog correction to 0-9 units q 4 hrs. Secure chat sent to Dr. Antonieta Pert.  Thank you, Nani Gasser. Shedrick Sarli, RN, MSN, CDE  Diabetes Coordinator Inpatient Glycemic Control Team Team Pager [phone] (8am-5pm) 07/15/2021 12:47 PM

## 2021-07-15 NOTE — Consult Note (Signed)
Referring Provider: Dr. Wynetta Fines Primary Care Physician:  Billie Ruddy, MD Primary Gastroenterologist:  Dr. Penelope Coop at [facility]  Reason for Consultation:  Cholangitis  HPI: Omar Travis is a 55 y.o. male with history of type 2 diabetes, hyperlipidemia, hypertension, reflux, anxiety, and known biliary ductal disease since 2015.  Patient was referred to [facility] hepatology in 2018 for abnormal MRCP, last saw Dr. Charm Barges there in 2019, patient was lost to follow-up there.  Patient and wife state over the past year he said several increasing episodes of abdominal pain.  We will have least 3-4 episodes in the last year we would have to miss work, happening at least once a month. Patient will have right upper quadrant abdominal pain with nausea, vomiting, occasionally fevers or chills.  Would last for up to a day at the most or several hours and resolved.  Worse with greasy foods  Wife states noticed patient with jaundice about 2 weeks ago lasted for about a day and a half and resolved until this recent admission. Patient did go see primary care about a week ago for this had ultrasound ordered which showed likely stones.  Developed jaundice again yesterday, fever of 101 yesterday with chills, right upper quadrant pain.  Has had decreased appetite.  Wife states possibly has lost weight.  Patient's had dark urine, states normal stools.  Denies melena or hematochezia.  He has not had any symptoms of liver disease such as hepatic encephalopathy, ascites, lower extremity edema or GI bleeding.  Patient has rare social ETOH use.   No illicit drug use or tobacco use.  No tattoos blood transfusions piercings done without sterile technique.   Sister with history of cholecystectomy No family hx of liver disease or liver cancer.  No family hx colon cancer or IBD.   10/10/2019 colonoscopy with Dr. Penelope Coop 3 mm hyperplastic polyp MRCP 07/14/2021 IMPRESSION: 1. Overall findings are similar to those  demonstrated on previous MRI from 08/19/2017. There are linear filling defects in the common bile duct without associated extrahepatic biliary dilatation. While these could reflect blood clots, consider biliary ascariasis. Recommend ERCP given the patient's jaundice. 2. Stable chronic intrahepatic biliary dilatation and volume loss posteriorly in the right hepatic lobe, likely benign based on stability from previous MRI's dating back to 07/12/2014. 3. Contracted gallbladder with mild nonspecific wall thickening, not impressive on recent ultrasound. 4. Bilateral renal sinus cysts.  MRCP 11/2017 at [facility] Impression: Severe segmental intrahepatic biliary ductal dilatation involving hepatic segments 6, unchanged dating back to 2015. Given stability, findings are favored to be related to chronic stricture and possibly related to aberrant right posterior ductal anatomy.    Past Medical History:  Diagnosis Date   Diabetes ([place])    GERD (gastroesophageal reflux disease)    HTN (hypertension)    Hyperlipidemia     History reviewed. No pertinent surgical history.  Prior to Admission medications   Medication Sig Start Date End Date Taking? Authorizing Provider  canagliflozin (INVOKANA) 300 MG TABS tablet Take 300 mg by mouth daily before breakfast.   Yes [provider]  dicyclomine (BENTYL) 10 MG capsule Take 10 mg by mouth 3 (three) times daily as needed for spasms. 05/08/21  Yes [provider]  escitalopram (LEXAPRO) 10 MG tablet TAKE ONE (1) TABLET EACH DAY Patient taking differently: Take 10 mg by mouth daily. 06/12/21  Yes Billie Ruddy, MD  fenofibrate (TRICOR) 145 MG tablet Take 145 mg by mouth daily.   Yes [provider]  insulin glargine, 1 Unit Dial, (TOUJEO SOLOSTAR) 300 UNIT/ML Solostar Pen Inject 54 Units into the skin daily. Patient taking differently: Inject 56 Units into the skin daily. 01/16/20  Yes Billie Ruddy, MD  lisinopril (ZESTRIL)  5 MG tablet TAKE ONE (1) TABLET EACH DAY Patient taking differently: Take 5 mg by mouth daily. 03/26/21  Yes Billie Ruddy, MD  omeprazole (PRILOSEC) 20 MG capsule Take 1 capsule (20 mg total) by mouth daily. 06/25/20  Yes Billie Ruddy, MD  ondansetron (ZOFRAN-ODT) 8 MG disintegrating tablet Take 8 mg by mouth every 8 (eight) hours as needed for nausea. 05/08/21  Yes [provider]  rosuvastatin (CRESTOR) 5 MG tablet Take 5 mg by mouth daily.   Yes [provider]  Semaglutide (OZEMPIC, 1 MG/DOSE, [place]) Inject 1 mg into the skin once a week.    [provider]    Scheduled Meds:  enoxaparin (LOVENOX) injection  40 mg Subcutaneous Q24H   escitalopram  10 mg Oral Daily   insulin aspart  0-20 Units Subcutaneous TID WC   pantoprazole  40 mg Oral Daily   Continuous Infusions:  sodium chloride 100 mL/hr at 07/15/21 0843   piperacillin-tazobactam (ZOSYN)  IV 3.375 g (07/15/21 0655)   PRN Meds:.HYDROmorphone (DILAUDID) injection, ketorolac, labetalol, LORazepam, ondansetron **OR** ondansetron (ZOFRAN) IV  Allergies as of 07/14/2021 - Review Complete 07/14/2021  Allergen Reaction Noted   Benadryl allergy [diphenhydramine hcl] Rash 08/03/2017   Flexeril [cyclobenzaprine]  01/16/2020    Family History  Problem Relation Age of Onset   Diabetes Mother    Liver cancer Father    Diabetes Father     Social History   Socioeconomic History   Marital status: Married    Spouse name: Not on file   Number of children: Not on file   Years of education: Not on file   Highest education level: Not on file  Occupational History   Not on file  Tobacco Use   Smoking status: Never   Smokeless tobacco: Never  Substance and Sexual Activity   Alcohol use: Yes   Drug use: Never   Sexual activity: Yes  Other Topics Concern   Not on file  Social History Narrative   Not on file   Social Determinants of Health   Financial Resource Strain: Not on file  Food Insecurity:  Not on file  Transportation Needs: Not on file  Physical Activity: Not on file  Stress: Not on file  Social Connections: Not on file  Intimate Partner Violence: Not on file    Review of Systems:  Review of Systems  Constitutional:  Positive for chills and fever.  HENT:  Negative for hearing loss.   Respiratory:  Negative for shortness of breath.   Cardiovascular:  Negative for chest pain and leg swelling.  Gastrointestinal:  Positive for abdominal pain, nausea and vomiting. Negative for blood in stool, constipation, diarrhea, heartburn and melena.  Genitourinary:        Dark urine  Musculoskeletal:  Negative for falls.  Skin:  Negative for itching.  Neurological:  Negative for loss of consciousness.  Psychiatric/Behavioral:  Negative for memory loss.     Physical Exam: Vital signs: Vitals:   07/15/21 0235 07/15/21 0600  BP: 111/73 109/72  Pulse: (!) 102 96  Resp: 16 18  Temp: 99.4 F (37.4 C)   SpO2: 95% 96%     Physical Exam  General Alert, in NAD. + scleral icterus.  Lung: Clear anteriorly;  no wheezing Heart: Regular rate and rhythm; no murmurs,  Abdomen: Soft. RUQ tenderness without murphy Nondistended. Normal bowel sounds. No rebound or guarding. No fluid wave. Skin: Patient is jaundice.  Alert and  oriented x4;  grossly normal neurologically; no asterixis or clonus.  GI:  Lab Results: Recent Labs    07/14/21 0735 07/15/21 0451  WBC 14.6* 6.8  HGB 15.1 12.7*  HCT 47.9 39.9  PLT 205 128*   BMET Recent Labs    07/14/21 0735 07/15/21 0451  NA 134* 134*  K 4.4 3.9  CL 99 102  CO2 25 18*  GLUCOSE 186* 71  BUN 12 13  CREATININE 1.16 1.08  CALCIUM 9.2 8.3*   LFT Recent Labs    07/15/21 0451  PROT 6.0*  ALBUMIN 3.0*  AST 219*  ALT 257*  ALKPHOS 111  BILITOT 6.3*   PT/INR Recent Labs    07/15/21 0451  LABPROT 14.7  INR 1.2    Studies/Results: CT ABDOMEN PELVIS W CONTRAST  Result Date: 07/14/2021 CLINICAL DATA:  Abdominal pain,  acute, nonlocalized suspect choledocholithiasis EXAM: CT ABDOMEN AND PELVIS WITH CONTRAST TECHNIQUE: Multidetector CT imaging of the abdomen and pelvis was performed using the standard protocol following bolus administration of intravenous contrast. CONTRAST:  175mL OMNIPAQUE IOHEXOL 350 MG/ML SOLN COMPARISON:  August 19, 2017 FINDINGS: Lower chest: No acute abnormality. Hepatobiliary: Revisualization of intrahepatic biliary ductal dilation of predominately hepatic segment 6. This is similar in comparison to prior MRI. Unchanged appearance of the common bile duct. Gallbladder is completely decompressed. Cholelithiasis. There is adjacent haziness of the fat along the gallbladder. Hepatic steatosis. Pancreas: Unremarkable. No pancreatic ductal dilatation or surrounding inflammatory changes. Spleen: Unchanged mild splenomegaly. Adrenals/Urinary Tract: Adrenal glands are unremarkable. No hydronephrosis. Parapelvic cysts. Kidneys enhance symmetrically. No obstructing nephrolithiasis. Subcentimeter hypodense mass of the LEFT kidney is too small to accurately characterize. Bladder is unremarkable. Stomach/Bowel: No evidence of bowel obstruction. Appendix is normal. Moderate colonic stool burden diffusely throughout the colon. Misty mesentery, nonspecific. Vascular/Lymphatic: No significant vascular findings are present. No enlarged retroperitoneal lymph nodes by size criteria. There are scattered mesenteric lymph nodes which are mildly prominent with representative prominent mesenteric lymph node measuring 8 mm in the short axis (series 3, image 38). Reproductive: Prostate is present. Other: No free air or free fluid. Musculoskeletal: No acute or significant osseous findings. IMPRESSION: 1. The gallbladder is decompressed around a cholelithiasis. However, there is haziness of the adjacent fat with subjective wall thickening of the gallbladder. Findings could reflect acute on chronic cholecystitis. No choledocholithiasis  visualized. 2. Similar appearance of intrahepatic biliary ductal dilation of predominately hepatic segment 6. 3. Hepatic steatosis. 4. Misty mesentery, nonspecific Electronically Signed   By: Valentino Saxon M.D.   On: 07/14/2021 12:46   MR ABDOMEN MRCP WO CONTRAST  Result Date: 07/14/2021 CLINICAL DATA:  Jaundice. EXAM: MRI ABDOMEN WITHOUT CONTRAST  (INCLUDING MRCP) TECHNIQUE: Multiplanar multisequence MR imaging of the abdomen was performed. Heavily T2-weighted images of the biliary and pancreatic ducts were obtained, and three-dimensional MRCP images were rendered by post processing. COMPARISON:  Abdominopelvic CT same date. Ultrasound 07/11/2021 and MRI 08/19/2017. FINDINGS: Despite efforts by the technologist and patient, mild motion artifact is present on today's exam and could not be eliminated. This reduces exam sensitivity and specificity. Lower chest:  The visualized lower chest appears unremarkable. Hepatobiliary: No morphologic changes of cirrhosis or significant hepatic steatosis. The gallbladder is contracted with mild wall thickening. Small calcified gallstone seen on CT is not well visualized.  Again demonstrated is chronic volume loss and intrahepatic biliary dilatation medially in the posterior right lobe of the liver, predominately segment 6. This is unchanged from the previous MRI. There is no other intrahepatic biliary dilatation. There is no extrahepatic biliary dilatation, however, there are small somewhat linear filling defects in the mid to distal common bile duct, similar to those seen on previous MRCP. These are not calcified on today's CT. Pancreas: Unremarkable. No pancreatic ductal dilatation or surrounding inflammatory changes. Spleen: Stable mild splenomegaly without focal abnormality. Adrenals/Urinary Tract: Both adrenal glands appear normal. Renal sinus cysts are noted bilaterally without hydronephrosis or focal renal abnormality. Stomach/Bowel: The visualized bowel appears  normal, without distension, wall thickening or surrounding inflammation. Vascular/Lymphatic: There are no enlarged abdominal lymph nodes. No significant vascular findings. Other: No evidence of abdominal wall hernia or ascites. Musculoskeletal: No acute or significant osseous findings. IMPRESSION: 1. Overall findings are similar to those demonstrated on previous MRI from 08/19/2017. There are linear filling defects in the common bile duct without associated extrahepatic biliary dilatation. While these could reflect blood clots, consider biliary ascariasis. Recommend ERCP given the patient's jaundice. 2. Stable chronic intrahepatic biliary dilatation and volume loss posteriorly in the right hepatic lobe, likely benign based on stability from previous MRI's dating back to 07/12/2014. 3. Contracted gallbladder with mild nonspecific wall thickening, not impressive on recent ultrasound. 4. Bilateral renal sinus cysts. Electronically Signed   By: Richardean Sale M.D.   On: 07/14/2021 18:02    Impression  Cholangitis -Patient with history of chronically intrahepatic biliary dilatation since 2015 -A year of right upper quadrant discomfort comfort intermittently with greasy foods, now jaundice with elevated alkaline phosphatase that is trending down.  Surgery has been consulted. WBC 6.8 HGB 12.7 MCV 87.3 Platelets 128 AST 219 ALT 257 Alkphos 111 TBili 6.3  Pancreatitis secondary to 1 LIPASE 102 Calcium 8.3 Glucose 71 Potassium 3.9    Plan Continue Zosyn antibiotics DC lovenox at this time, can use SCD if needed.  ERCP for tomorrow Clear liquids today, NPO at midnight.  I thoroughly discussed procedure with the patient to include nature, alternatives, benefits, and risks (including but not limited to post ERCP pancreatitis, bleeding, infection, perforation, anesthesia/cardiac pulmonary complications).  Patient verbalized understanding and gave verbal consent to proceed with ERCP.  Surgery has been  consulted will need lap choley in the future.    LOS: 1 day   Vladimir Crofts  PA-C 07/15/2021, 9:12 AM  Contact #  [phone]

## 2021-07-15 NOTE — Plan of Care (Signed)
  Problem: Education: Goal: Knowledge of General Education information will improve Description: Including pain rating scale, medication(s)/side effects and non-pharmacologic comfort measures Outcome: Progressing   Problem: Clinical Measurements: Goal: Will remain free from infection Outcome: Progressing Goal: Diagnostic test results will improve Outcome: Progressing   Problem: Pain Managment: Goal: General experience of comfort will improve Outcome: Progressing   

## 2021-07-15 NOTE — Progress Notes (Signed)
Patient ID: Omar Travis, male   DOB: 01/23/1966, 55 y.o.   MRN: [id number] [facility] Surgery Progress Note     Subjective: CC-  Feeling better since admission but does still complain of epigastric pain.  WBC down 6.8, Tbili up 6.3 from 4.4.  Objective: Vital signs in last 24 hours: Temp:  [99.3 F (37.4 C)-99.8 F (37.7 C)] 99.4 F (37.4 C) (10/24 0235) Pulse Rate:  [96-124] 96 (10/24 0600) Resp:  [16-23] 18 (10/24 0600) BP: (102-116)/(53-76) 109/72 (10/24 0600) SpO2:  [90 %-99 %] 96 % (10/24 0600)    Intake/Output from previous day: 10/23 0701 - 10/24 0700 In: 50 [IV Piggyback:50] Out: -  Intake/Output this shift: No intake/output data recorded.  PE: Gen:  Alert, NAD, pleasant HEENT: EOM's intact, pupils equal and round Pulm:  rate and effort normal Abd: Soft, ND, TTP epigastrium without peritonitis Psych: A&Ox4  Skin: jaundice, warm and dry  Lab Results:  Recent Labs    07/14/21 0735 07/15/21 0451  WBC 14.6* 6.8  HGB 15.1 12.7*  HCT 47.9 39.9  PLT 205 128*   BMET Recent Labs    07/14/21 0735 07/15/21 0451  NA 134* 134*  K 4.4 3.9  CL 99 102  CO2 25 18*  GLUCOSE 186* 71  BUN 12 13  CREATININE 1.16 1.08  CALCIUM 9.2 8.3*   PT/INR Recent Labs    07/15/21 0451  LABPROT 14.7  INR 1.2   CMP     Component Value Date/Time   NA 134 (L) 07/15/2021 0451   K 3.9 07/15/2021 0451   CL 102 07/15/2021 0451   CO2 18 (L) 07/15/2021 0451   GLUCOSE 71 07/15/2021 0451   BUN 13 07/15/2021 0451   CREATININE 1.08 07/15/2021 0451   CALCIUM 8.3 (L) 07/15/2021 0451   PROT 6.0 (L) 07/15/2021 0451   ALBUMIN 3.0 (L) 07/15/2021 0451   AST 219 (H) 07/15/2021 0451   ALT 257 (H) 07/15/2021 0451   ALKPHOS 111 07/15/2021 0451   BILITOT 6.3 (H) 07/15/2021 0451   GFRNONAA >60 07/15/2021 0451   Lipase     Component Value Date/Time   LIPASE 102 (H) 07/14/2021 0735       Studies/Results: CT ABDOMEN PELVIS W CONTRAST  Result Date:  07/14/2021 CLINICAL DATA:  Abdominal pain, acute, nonlocalized suspect choledocholithiasis EXAM: CT ABDOMEN AND PELVIS WITH CONTRAST TECHNIQUE: Multidetector CT imaging of the abdomen and pelvis was performed using the standard protocol following bolus administration of intravenous contrast. CONTRAST:  197mL OMNIPAQUE IOHEXOL 350 MG/ML SOLN COMPARISON:  August 19, 2017 FINDINGS: Lower chest: No acute abnormality. Hepatobiliary: Revisualization of intrahepatic biliary ductal dilation of predominately hepatic segment 6. This is similar in comparison to prior MRI. Unchanged appearance of the common bile duct. Gallbladder is completely decompressed. Cholelithiasis. There is adjacent haziness of the fat along the gallbladder. Hepatic steatosis. Pancreas: Unremarkable. No pancreatic ductal dilatation or surrounding inflammatory changes. Spleen: Unchanged mild splenomegaly. Adrenals/Urinary Tract: Adrenal glands are unremarkable. No hydronephrosis. Parapelvic cysts. Kidneys enhance symmetrically. No obstructing nephrolithiasis. Subcentimeter hypodense mass of the LEFT kidney is too small to accurately characterize. Bladder is unremarkable. Stomach/Bowel: No evidence of bowel obstruction. Appendix is normal. Moderate colonic stool burden diffusely throughout the colon. Misty mesentery, nonspecific. Vascular/Lymphatic: No significant vascular findings are present. No enlarged retroperitoneal lymph nodes by size criteria. There are scattered mesenteric lymph nodes which are mildly prominent with representative prominent mesenteric lymph node measuring 8 mm in the short axis (series 3, image 38). Reproductive: Prostate  is present. Other: No free air or free fluid. Musculoskeletal: No acute or significant osseous findings. IMPRESSION: 1. The gallbladder is decompressed around a cholelithiasis. However, there is haziness of the adjacent fat with subjective wall thickening of the gallbladder. Findings could reflect acute on  chronic cholecystitis. No choledocholithiasis visualized. 2. Similar appearance of intrahepatic biliary ductal dilation of predominately hepatic segment 6. 3. Hepatic steatosis. 4. Misty mesentery, nonspecific Electronically Signed   By: Valentino Saxon M.D.   On: 07/14/2021 12:46   MR ABDOMEN MRCP WO CONTRAST  Result Date: 07/14/2021 CLINICAL DATA:  Jaundice. EXAM: MRI ABDOMEN WITHOUT CONTRAST  (INCLUDING MRCP) TECHNIQUE: Multiplanar multisequence MR imaging of the abdomen was performed. Heavily T2-weighted images of the biliary and pancreatic ducts were obtained, and three-dimensional MRCP images were rendered by post processing. COMPARISON:  Abdominopelvic CT same date. Ultrasound 07/11/2021 and MRI 08/19/2017. FINDINGS: Despite efforts by the technologist and patient, mild motion artifact is present on today's exam and could not be eliminated. This reduces exam sensitivity and specificity. Lower chest:  The visualized lower chest appears unremarkable. Hepatobiliary: No morphologic changes of cirrhosis or significant hepatic steatosis. The gallbladder is contracted with mild wall thickening. Small calcified gallstone seen on CT is not well visualized. Again demonstrated is chronic volume loss and intrahepatic biliary dilatation medially in the posterior right lobe of the liver, predominately segment 6. This is unchanged from the previous MRI. There is no other intrahepatic biliary dilatation. There is no extrahepatic biliary dilatation, however, there are small somewhat linear filling defects in the mid to distal common bile duct, similar to those seen on previous MRCP. These are not calcified on today's CT. Pancreas: Unremarkable. No pancreatic ductal dilatation or surrounding inflammatory changes. Spleen: Stable mild splenomegaly without focal abnormality. Adrenals/Urinary Tract: Both adrenal glands appear normal. Renal sinus cysts are noted bilaterally without hydronephrosis or focal renal abnormality.  Stomach/Bowel: The visualized bowel appears normal, without distension, wall thickening or surrounding inflammation. Vascular/Lymphatic: There are no enlarged abdominal lymph nodes. No significant vascular findings. Other: No evidence of abdominal wall hernia or ascites. Musculoskeletal: No acute or significant osseous findings. IMPRESSION: 1. Overall findings are similar to those demonstrated on previous MRI from 08/19/2017. There are linear filling defects in the common bile duct without associated extrahepatic biliary dilatation. While these could reflect blood clots, consider biliary ascariasis. Recommend ERCP given the patient's jaundice. 2. Stable chronic intrahepatic biliary dilatation and volume loss posteriorly in the right hepatic lobe, likely benign based on stability from previous MRI's dating back to 07/12/2014. 3. Contracted gallbladder with mild nonspecific wall thickening, not impressive on recent ultrasound. 4. Bilateral renal sinus cysts. Electronically Signed   By: Richardean Sale M.D.   On: 07/14/2021 18:02    Anti-infectives: Anti-infectives (From admission, onward)    Start     Dose/Rate Route Frequency Ordered Stop   07/14/21 2300  piperacillin-tazobactam (ZOSYN) IVPB 3.375 g       See Hyperspace for full Linked Orders Report.   3.375 g 12.5 mL/hr over 240 Minutes Intravenous Every 8 hours 07/14/21 1408     07/14/21 1415  piperacillin-tazobactam (ZOSYN) IVPB 3.375 g       See Hyperspace for full Linked Orders Report.   3.375 g 100 mL/hr over 30 Minutes Intravenous  Once 07/14/21 1408 07/14/21 2032   07/14/21 1130  piperacillin-tazobactam (ZOSYN) IVPB 3.375 g        3.375 g 100 mL/hr over 30 Minutes Intravenous  Once 07/14/21 1119 07/14/21 1301  Assessment/Plan Acute cholecystitis Jaundice Pancreatitis - lipase 102 (10/23) Possible choledocholithiasis - Patient states that he was told by GI he is going for ERCP either today or tomorrow depending on endo  availability. Once that is complete we will follow along for timing of laparoscopic cholecystectomy. Trend LFTs/lipase.   ID - zosyn 10/23 FEN - IVF, NPO VTE - lovenox Foley - none  HTN HLD DM GERD   LOS: 1 day    Wellington Hampshire, [facility] Surgery 07/15/2021, 10:14 AM Please see Amion for pager number during day hours 7:00am-4:30pm

## 2021-07-15 NOTE — Anesthesia Preprocedure Evaluation (Addendum)
Anesthesia Evaluation  Patient identified by MRN, date of birth, ID band Patient awake    Reviewed: Allergy & Precautions, NPO status , Patient's Chart, lab work & pertinent test results  History of Anesthesia Complications Negative for: history of anesthetic complications  Airway Mallampati: II  TM Distance: >3 FB Neck ROM: Full    Dental no notable dental hx. (+) Dental Advisory Given   Pulmonary neg pulmonary ROS,    Pulmonary exam normal        Cardiovascular hypertension, Pt. on medications Normal cardiovascular exam     Neuro/Psych PSYCHIATRIC DISORDERS Depression negative neurological ROS     GI/Hepatic Neg liver ROS, GERD  ,  Endo/Other  diabetes  Renal/GU negative Renal ROS     Musculoskeletal negative musculoskeletal ROS (+)   Abdominal   Peds  Hematology negative hematology ROS (+)   Anesthesia Other Findings   Reproductive/Obstetrics                            Anesthesia Physical Anesthesia Plan  ASA: 2  Anesthesia Plan: General   Post-op Pain Management:    Induction: Intravenous  PONV Risk Score and Plan: 2 and Ondansetron and Midazolam  Airway Management Planned: Oral ETT  Additional Equipment:   Intra-op Plan:   Post-operative Plan: Extubation in OR  Informed Consent: I have reviewed the patients History and Physical, chart, labs and discussed the procedure including the risks, benefits and alternatives for the proposed anesthesia with the patient or authorized representative who has indicated his/her understanding and acceptance.     Dental advisory given  Plan Discussed with: Anesthesiologist and CRNA  Anesthesia Plan Comments:        Anesthesia Quick Evaluation

## 2021-07-15 NOTE — Progress Notes (Signed)
PROGRESS NOTE    Omar Travis  KCL:[id number] DOB: 1966-09-19 DOA: 07/14/2021 PCP: Billie Ruddy, MD   Chief Complaint  Patient presents with   Abdominal Pain   Brief Narrative/Hospital Course:  Omar Travis, 55 y.o. male with PMH of chronic intrahepatic bile duct dilation followed by GI at [facility], insulin-dependent diabetes, hypertension, hyperlipidemia came with worsening right upper quad abdominal pain, jaundice, symptoms started on Monday with acute onset right upper quadrant pain seen by PCP and had LFTs done that were abnormal right upper quadrant ultrasound was done that showed a small stones with in gallbladder, outpatient HIDA scan was also ordered but not done he developed severe right upper quadrant pain on the day of admission and seen in the ED  In the ED febrile 101.2, abnormal LFTs CT abdomen pelvis showing acute and chronic cholecystitis, surgery and GI consulted and patient admitted   Subjective: Seen and examined this morning.  Patient denies any nausea vomiting abdominal pain.  Does have tenderness in the right upper quadrant.  Appears more icteric today ,wife at the bedside.  Assessment & Plan:  Acute cholecystitis Acute cholangitis suspecting obstructive jaundice Pancreatitis with lipase 102 Suspected choledocholithiasis: This morning no abdominal pain but has tenderness.  Leukocytosis improved.  GI and general surgery on board, underwent CT abdomen that showed possible acute on chronic cholecystitis, intrahepatic biliary duct dilatation, hepatic steatosis and MRCP showed linear filling defect in the CBD with associated extrahepatic biliary dilation that could reflect blood clots, consider biliary ascariasis, recommend ERCP.  Continue Zosyn, IV fluids, p.o. as per GI/surgery.  Possible ERCP then cholecystectomy.  CLD today and n.p.o. at midnight.  Mild hyponatremia Metabolic acidosis: cont IV fluids  Essential hypertension: BP is controlled holding home  meds for now  Diabetes on long-term insulin: Continue sliding scale insulin hold p.o. meds. Recent Labs  Lab 07/14/21 1831 07/14/21 2241 07/15/21 0810  GLUCAP 119* 96 72   HLD holding statin due to abnormal LFTs  Hepatic steatosis Obesity/overweight: Will benefit with weight loss  DVT prophylaxis: SCD.  No chemical prophylaxis as per GI Code Status:   Code Status: Full Code Family Communication: plan of care discussed with patient and his wife at bedside. Status is: Inpatient  Remains inpatient appropriate because: For ongoing management of his cholangitis choledocholithiasis   Objective: Vitals last 24 hrs: Vitals:   07/14/21 2214 07/15/21 0235 07/15/21 0600 07/15/21 1000  BP: 116/72 111/73 109/72 110/64  Pulse: (!) 112 (!) 102 96 82  Resp: 17 16 18 16   Temp:  99.4 F (37.4 C)  98.4 F (36.9 C)  TempSrc:  Oral    SpO2: 95% 95% 96% 99%   Weight change:   Intake/Output Summary (Last 24 hours) at 07/15/2021 1045 Last data filed at 07/15/2021 0242 Gross per 24 hour  Intake 50 ml  Output --  Net 50 ml   Net IO Since Admission: 50 mL [07/15/21 1045]   Physical Examination: General exam: AA0x3, pleasant, not in distress HEENT:Oral mucosa moist, Ear/Nose WNL grossly,dentition normal. Respiratory system: B/l clear BS, no use of accessory muscle, non tender. Cardiovascular system: S1 & S2 +,No JVD. Gastrointestinal system: Abdomen soft, tender right upper quadrant,ND, BS+. Nervous System:Alert, awake, moving extremities. Extremities: edema none, distal peripheral pulses palpable.  Skin: No rashes, ++ icterus in eye. MSK: Normal muscle bulk, tone, power.  Medications reviewed:  Scheduled Meds:  escitalopram  10 mg Oral Daily   insulin aspart  0-20 Units Subcutaneous TID WC  pantoprazole  40 mg Oral Daily   Continuous Infusions:  sodium chloride 100 mL/hr at 07/15/21 0843   piperacillin-tazobactam (ZOSYN)  IV 3.375 g (07/15/21 0655)   Diet Order              Diet NPO time specified  Diet effective midnight           Diet clear liquid Room service appropriate? Yes; Fluid consistency: Thin  Diet effective now                          Weight change:   Wt Readings from Last 3 Encounters:  07/08/21 107.6 kg  07/30/20 110.5 kg  06/25/20 112.9 kg     Consultants:see note  Procedures:see note Antimicrobials: Anti-infectives (From admission, onward)    Start     Dose/Rate Route Frequency Ordered Stop   07/14/21 2300  piperacillin-tazobactam (ZOSYN) IVPB 3.375 g       See Hyperspace for full Linked Orders Report.   3.375 g 12.5 mL/hr over 240 Minutes Intravenous Every 8 hours 07/14/21 1408     07/14/21 1415  piperacillin-tazobactam (ZOSYN) IVPB 3.375 g       See Hyperspace for full Linked Orders Report.   3.375 g 100 mL/hr over 30 Minutes Intravenous  Once 07/14/21 1408 07/14/21 2032   07/14/21 1130  piperacillin-tazobactam (ZOSYN) IVPB 3.375 g        3.375 g 100 mL/hr over 30 Minutes Intravenous  Once 07/14/21 1119 07/14/21 1301      Culture/Microbiology No results found for: SDES, SPECREQUEST, CULT, REPTSTATUS  Other culture-see note  Unresulted Labs (From admission, onward)     Start     Ordered   07/16/21 0500  CBC  Tomorrow morning,   R        07/15/21 1022   07/16/21 0500  Comprehensive metabolic panel  Tomorrow morning,   R        07/15/21 1022   07/16/21 0500  Lipase, blood  Tomorrow morning,   R        07/15/21 1022   07/14/21 0739  Lactic acid, plasma  Now then every 2 hours,   STAT      07/14/21 0738          Data Reviewed: I have personally reviewed following labs and imaging studies CBC: Recent Labs  Lab 07/08/21 1155 07/14/21 0735 07/15/21 0451  WBC 8.5 14.6* 6.8  NEUTROABS 6.2  --  5.2  HGB 14.9 15.1 12.7*  HCT 46.0 47.9 39.9  MCV 84.6 86.9 87.3  PLT 205.0 205 448*   Basic Metabolic Panel: Recent Labs  Lab 07/08/21 1155 07/14/21 0735 07/15/21 0451  NA 133* 134* 134*  K 4.7 4.4 3.9   CL 98 99 102  CO2 29 25 18*  GLUCOSE 282* 186* 71  BUN 15 12 13   CREATININE 1.23 1.16 1.08  CALCIUM 9.7 9.2 8.3*   GFR: CrCl cannot be calculated (Unknown ideal weight.). Liver Function Tests: Recent Labs  Lab 07/08/21 1155 07/14/21 0735 07/15/21 0451  AST 30 243* 219*  ALT 58* 188* 257*  ALKPHOS 151* 135* 111  BILITOT 0.9 4.4* 6.3*  PROT 7.4 7.1 6.0*  ALBUMIN 4.4 4.0 3.0*   Recent Labs  Lab 07/14/21 0735  LIPASE 102*   No results for input(s): AMMONIA in the last 168 hours. Coagulation Profile: Recent Labs  Lab 07/15/21 0451  INR 1.2   Cardiac Enzymes: No results for input(s): CKTOTAL,  CKMB, CKMBINDEX, TROPONINI in the last 168 hours. BNP (last 3 results) No results for input(s): PROBNP in the last 8760 hours. HbA1C: No results for input(s): HGBA1C in the last 72 hours. CBG: Recent Labs  Lab 07/14/21 1831 07/14/21 2241 07/15/21 0810  GLUCAP 119* 96 72   Lipid Profile: No results for input(s): CHOL, HDL, LDLCALC, TRIG, CHOLHDL, LDLDIRECT in the last 72 hours. Thyroid Function Tests: No results for input(s): TSH, T4TOTAL, FREET4, T3FREE, THYROIDAB in the last 72 hours. Anemia Panel: No results for input(s): VITAMINB12, FOLATE, FERRITIN, TIBC, IRON, RETICCTPCT in the last 72 hours. Sepsis Labs: Recent Labs  Lab 07/14/21 2023  LATICACIDVEN 1.0    Recent Results (from the past 240 hour(s))  Resp Panel by RT-PCR (Flu A&B, Covid) Nasopharyngeal Swab     Status: None   Collection Time: 07/14/21 10:30 PM   Specimen: Nasopharyngeal Swab; Nasopharyngeal(NP) swabs in vial transport medium  Result Value Ref Range Status   SARS Coronavirus 2 by RT PCR NEGATIVE NEGATIVE Final    Comment: (NOTE) SARS-CoV-2 target nucleic acids are NOT DETECTED.  The SARS-CoV-2 RNA is generally detectable in upper respiratory specimens during the acute phase of infection. The lowest concentration of SARS-CoV-2 viral copies this assay can detect is 138 copies/mL. A negative  result does not preclude SARS-Cov-2 infection and should not be used as the sole basis for treatment or other patient management decisions. A negative result may occur with  improper specimen collection/handling, submission of specimen other than nasopharyngeal swab, presence of viral mutation(s) within the areas targeted by this assay, and inadequate number of viral copies(<138 copies/mL). A negative result must be combined with clinical observations, patient history, and epidemiological information. The expected result is Negative.  Fact Sheet for Patients:  EntrepreneurPulse.com.au  Fact Sheet for Healthcare Providers:  IncredibleEmployment.be  This test is no t yet approved or cleared by the [place] FDA and  has been authorized for detection and/or diagnosis of SARS-CoV-2 by FDA under an Emergency Use Authorization (EUA). This EUA will remain  in effect (meaning this test can be used) for the duration of the COVID-19 declaration under Section 564(b)(1) of the Act, 21 U.S.C.section 360bbb-3(b)(1), unless the authorization is terminated  or revoked sooner.       Influenza A by PCR NEGATIVE NEGATIVE Final   Influenza B by PCR NEGATIVE NEGATIVE Final    Comment: (NOTE) The Xpert Xpress SARS-CoV-2/FLU/RSV plus assay is intended as an aid in the diagnosis of influenza from Nasopharyngeal swab specimens and should not be used as a sole basis for treatment. Nasal washings and aspirates are unacceptable for Xpert Xpress SARS-CoV-2/FLU/RSV testing.  Fact Sheet for Patients: EntrepreneurPulse.com.au  Fact Sheet for Healthcare Providers: IncredibleEmployment.be  This test is not yet approved or cleared by the [place] FDA and has been authorized for detection and/or diagnosis of SARS-CoV-2 by FDA under an Emergency Use Authorization (EUA). This EUA will remain in effect (meaning this test can be used)  for the duration of the COVID-19 declaration under Section 564(b)(1) of the Act, 21 U.S.C. section 360bbb-3(b)(1), unless the authorization is terminated or revoked.  Performed at [facility], [place] [address] [place], [place] [address]      Radiology Studies: CT ABDOMEN PELVIS W CONTRAST  Result Date: 07/14/2021 CLINICAL DATA:  Abdominal pain, acute, nonlocalized suspect choledocholithiasis EXAM: CT ABDOMEN AND PELVIS WITH CONTRAST TECHNIQUE: Multidetector CT imaging of the abdomen and pelvis was performed using the standard protocol following bolus administration of intravenous  contrast. CONTRAST:  159mL OMNIPAQUE IOHEXOL 350 MG/ML SOLN COMPARISON:  August 19, 2017 FINDINGS: Lower chest: No acute abnormality. Hepatobiliary: Revisualization of intrahepatic biliary ductal dilation of predominately hepatic segment 6. This is similar in comparison to prior MRI. Unchanged appearance of the common bile duct. Gallbladder is completely decompressed. Cholelithiasis. There is adjacent haziness of the fat along the gallbladder. Hepatic steatosis. Pancreas: Unremarkable. No pancreatic ductal dilatation or surrounding inflammatory changes. Spleen: Unchanged mild splenomegaly. Adrenals/Urinary Tract: Adrenal glands are unremarkable. No hydronephrosis. Parapelvic cysts. Kidneys enhance symmetrically. No obstructing nephrolithiasis. Subcentimeter hypodense mass of the LEFT kidney is too small to accurately characterize. Bladder is unremarkable. Stomach/Bowel: No evidence of bowel obstruction. Appendix is normal. Moderate colonic stool burden diffusely throughout the colon. Misty mesentery, nonspecific. Vascular/Lymphatic: No significant vascular findings are present. No enlarged retroperitoneal lymph nodes by size criteria. There are scattered mesenteric lymph nodes which are mildly prominent with representative prominent mesenteric lymph node measuring 8 mm in the short axis (series 3, image 38).  Reproductive: Prostate is present. Other: No free air or free fluid. Musculoskeletal: No acute or significant osseous findings. IMPRESSION: 1. The gallbladder is decompressed around a cholelithiasis. However, there is haziness of the adjacent fat with subjective wall thickening of the gallbladder. Findings could reflect acute on chronic cholecystitis. No choledocholithiasis visualized. 2. Similar appearance of intrahepatic biliary ductal dilation of predominately hepatic segment 6. 3. Hepatic steatosis. 4. Misty mesentery, nonspecific Electronically Signed   By: Valentino Saxon M.D.   On: 07/14/2021 12:46   MR ABDOMEN MRCP WO CONTRAST  Result Date: 07/14/2021 CLINICAL DATA:  Jaundice. EXAM: MRI ABDOMEN WITHOUT CONTRAST  (INCLUDING MRCP) TECHNIQUE: Multiplanar multisequence MR imaging of the abdomen was performed. Heavily T2-weighted images of the biliary and pancreatic ducts were obtained, and three-dimensional MRCP images were rendered by post processing. COMPARISON:  Abdominopelvic CT same date. Ultrasound 07/11/2021 and MRI 08/19/2017. FINDINGS: Despite efforts by the technologist and patient, mild motion artifact is present on today's exam and could not be eliminated. This reduces exam sensitivity and specificity. Lower chest:  The visualized lower chest appears unremarkable. Hepatobiliary: No morphologic changes of cirrhosis or significant hepatic steatosis. The gallbladder is contracted with mild wall thickening. Small calcified gallstone seen on CT is not well visualized. Again demonstrated is chronic volume loss and intrahepatic biliary dilatation medially in the posterior right lobe of the liver, predominately segment 6. This is unchanged from the previous MRI. There is no other intrahepatic biliary dilatation. There is no extrahepatic biliary dilatation, however, there are small somewhat linear filling defects in the mid to distal common bile duct, similar to those seen on previous MRCP. These are  not calcified on today's CT. Pancreas: Unremarkable. No pancreatic ductal dilatation or surrounding inflammatory changes. Spleen: Stable mild splenomegaly without focal abnormality. Adrenals/Urinary Tract: Both adrenal glands appear normal. Renal sinus cysts are noted bilaterally without hydronephrosis or focal renal abnormality. Stomach/Bowel: The visualized bowel appears normal, without distension, wall thickening or surrounding inflammation. Vascular/Lymphatic: There are no enlarged abdominal lymph nodes. No significant vascular findings. Other: No evidence of abdominal wall hernia or ascites. Musculoskeletal: No acute or significant osseous findings. IMPRESSION: 1. Overall findings are similar to those demonstrated on previous MRI from 08/19/2017. There are linear filling defects in the common bile duct without associated extrahepatic biliary dilatation. While these could reflect blood clots, consider biliary ascariasis. Recommend ERCP given the patient's jaundice. 2. Stable chronic intrahepatic biliary dilatation and volume loss posteriorly in the right hepatic lobe, likely benign based on  stability from previous MRI's dating back to 07/12/2014. 3. Contracted gallbladder with mild nonspecific wall thickening, not impressive on recent ultrasound. 4. Bilateral renal sinus cysts. Electronically Signed   By: Richardean Sale M.D.   On: 07/14/2021 18:02     LOS: 1 day   Antonieta Pert, MD [facility]  07/15/2021, 10:45 AM

## 2021-07-15 NOTE — ED Notes (Signed)
CBG 69, pt given apple juice will re-assess

## 2021-07-15 NOTE — ED Notes (Signed)
Pt appears slightly more jaundiced than earlier. Dr. Roosevelt Locks made aware via epic secure chat

## 2021-07-16 ENCOUNTER — Inpatient Hospital Stay (HOSPITAL_COMMUNITY): Payer: BC Managed Care – PPO

## 2021-07-16 ENCOUNTER — Encounter (HOSPITAL_COMMUNITY)
Admission: EM | Disposition: A | Discharge status: Home / Self Care | Payer: Self-pay | Source: Home / Self Care | Attending: Internal Medicine

## 2021-07-16 ENCOUNTER — Inpatient Hospital Stay (HOSPITAL_COMMUNITY): Payer: BC Managed Care – PPO | Admitting: Certified Registered"

## 2021-07-16 ENCOUNTER — Encounter (HOSPITAL_COMMUNITY): Payer: Self-pay | Admitting: Internal Medicine

## 2021-07-16 HISTORY — PX: REMOVAL OF STONES: SHX5545

## 2021-07-16 HISTORY — PX: PANCREATIC STENT PLACEMENT: SHX5539

## 2021-07-16 HISTORY — PX: SPHINCTEROTOMY: SHX5279

## 2021-07-16 HISTORY — PX: ENDOSCOPIC RETROGRADE CHOLANGIOPANCREATOGRAPHY (ERCP) WITH PROPOFOL: SHX5810

## 2021-07-16 LAB — CBC
HCT: 39.1 % (ref 39.0–52.0)
Hemoglobin: 12.6 g/dL — ABNORMAL LOW (ref 13.0–17.0)
MCH: 27.4 pg (ref 26.0–34.0)
MCHC: 32.2 g/dL (ref 30.0–36.0)
MCV: 85 fL (ref 80.0–100.0)
Platelets: 137 10*3/uL — ABNORMAL LOW (ref 150–400)
RBC: 4.6 MIL/uL (ref 4.22–5.81)
RDW: 13.8 % (ref 11.5–15.5)
WBC: 5.9 10*3/uL (ref 4.0–10.5)
nRBC: 0 % (ref 0.0–0.2)

## 2021-07-16 LAB — COMPREHENSIVE METABOLIC PANEL
ALT: 173 U/L — ABNORMAL HIGH (ref 0–44)
AST: 82 U/L — ABNORMAL HIGH (ref 15–41)
Albumin: 2.8 g/dL — ABNORMAL LOW (ref 3.5–5.0)
Alkaline Phosphatase: 121 U/L (ref 38–126)
Anion gap: 10 (ref 5–15)
BUN: 12 mg/dL (ref 6–20)
CO2: 24 mmol/L (ref 22–32)
Calcium: 8.5 mg/dL — ABNORMAL LOW (ref 8.9–10.3)
Chloride: 100 mmol/L (ref 98–111)
Creatinine, Ser: 0.94 mg/dL (ref 0.61–1.24)
GFR, Estimated: >60 mL/min (ref >60)
Glucose, Bld: 97 mg/dL (ref 70–99)
Potassium: 3.7 mmol/L (ref 3.5–5.1)
Sodium: 134 mmol/L — ABNORMAL LOW (ref 135–145)
Total Bilirubin: 2.4 mg/dL — ABNORMAL HIGH (ref 0.3–1.2)
Total Protein: 5.8 g/dL — ABNORMAL LOW (ref 6.5–8.1)

## 2021-07-16 LAB — GLUCOSE, CAPILLARY
Glucose-Capillary: 100 mg/dL — ABNORMAL HIGH (ref 70–99)
Glucose-Capillary: 102 mg/dL — ABNORMAL HIGH (ref 70–99)
Glucose-Capillary: 112 mg/dL — ABNORMAL HIGH (ref 70–99)
Glucose-Capillary: 137 mg/dL — ABNORMAL HIGH (ref 70–99)
Glucose-Capillary: 242 mg/dL — ABNORMAL HIGH (ref 70–99)
Glucose-Capillary: 78 mg/dL (ref 70–99)

## 2021-07-16 LAB — LIPASE, BLOOD: Lipase: 29 U/L (ref 11–51)

## 2021-07-16 SURGERY — ENDOSCOPIC RETROGRADE CHOLANGIOPANCREATOGRAPHY (ERCP) WITH PROPOFOL
Anesthesia: General

## 2021-07-16 MED ORDER — MIDAZOLAM HCL 2 MG/2ML IJ SOLN
INTRAMUSCULAR | Status: DC | PRN
  Administered 2021-07-16: 2 mg via INTRAVENOUS

## 2021-07-16 MED ORDER — ACETAMINOPHEN 500 MG PO TABS
1000.0000 mg | ORAL_TABLET | ORAL | Status: AC
  Administered 2021-07-17: 1000 mg via ORAL
  Filled 2021-07-16: qty 2

## 2021-07-16 MED ORDER — LIDOCAINE 2% (20 MG/ML) 5 ML SYRINGE
INTRAMUSCULAR | Status: DC | PRN
  Administered 2021-07-16: 100 mg via INTRAVENOUS

## 2021-07-16 MED ORDER — ROCURONIUM BROMIDE 10 MG/ML (PF) SYRINGE
PREFILLED_SYRINGE | INTRAVENOUS | Status: DC | PRN
  Administered 2021-07-16: 50 mg via INTRAVENOUS

## 2021-07-16 MED ORDER — DEXMEDETOMIDINE (PRECEDEX) IN NS 20 MCG/5ML (4 MCG/ML) IV SYRINGE
PREFILLED_SYRINGE | INTRAVENOUS | Status: DC | PRN
  Administered 2021-07-16: 12 ug via INTRAVENOUS

## 2021-07-16 MED ORDER — LACTATED RINGERS IV SOLN: INTRAVENOUS | Status: DC | PRN

## 2021-07-16 MED ORDER — KETOROLAC TROMETHAMINE 15 MG/ML IJ SOLN: 15.0000 mg | INTRAMUSCULAR | Status: DC

## 2021-07-16 MED ORDER — INDOMETHACIN 50 MG RE SUPP
RECTAL | Status: DC | PRN
  Administered 2021-07-16: 100 mg via RECTAL

## 2021-07-16 MED ORDER — GLUCAGON HCL RDNA (DIAGNOSTIC) 1 MG IJ SOLR
INTRAMUSCULAR | Status: AC
  Filled 2021-07-16: qty 1

## 2021-07-16 MED ORDER — CIPROFLOXACIN IN D5W 400 MG/200ML IV SOLN
INTRAVENOUS | Status: AC
  Filled 2021-07-16: qty 200

## 2021-07-16 MED ORDER — FENTANYL CITRATE (PF) 250 MCG/5ML IJ SOLN
INTRAMUSCULAR | Status: DC | PRN
  Administered 2021-07-16: 100 ug via INTRAVENOUS

## 2021-07-16 MED ORDER — GABAPENTIN 300 MG PO CAPS: 300.0000 mg | ORAL_CAPSULE | ORAL | Status: DC

## 2021-07-16 MED ORDER — ONDANSETRON HCL 4 MG/2ML IJ SOLN
INTRAMUSCULAR | Status: DC | PRN
  Administered 2021-07-16: 4 mg via INTRAVENOUS

## 2021-07-16 MED ORDER — INDOMETHACIN 50 MG RE SUPP
RECTAL | Status: AC
  Filled 2021-07-16: qty 2

## 2021-07-16 MED ORDER — PROPOFOL 10 MG/ML IV BOLUS
INTRAVENOUS | Status: DC | PRN
Start: 2021-07-16 — End: 2021-07-16
  Administered 2021-07-16: 130 mg via INTRAVENOUS

## 2021-07-16 MED ORDER — SUCCINYLCHOLINE CHLORIDE 200 MG/10ML IV SOSY
PREFILLED_SYRINGE | INTRAVENOUS | Status: DC | PRN
  Administered 2021-07-16: 100 mg via INTRAVENOUS

## 2021-07-16 MED ORDER — SUGAMMADEX SODIUM 200 MG/2ML IV SOLN
INTRAVENOUS | Status: DC | PRN
  Administered 2021-07-16: 200 mg via INTRAVENOUS

## 2021-07-16 MED ORDER — ACETAMINOPHEN 500 MG PO TABS
1000.0000 mg | ORAL_TABLET | Freq: Once | ORAL | Status: AC
  Administered 2021-07-16: 1000 mg via ORAL
  Filled 2021-07-16: qty 2

## 2021-07-16 NOTE — Plan of Care (Signed)
  Problem: Education: Goal: Knowledge of General Education information will improve Description: Including pain rating scale, medication(s)/side effects and non-pharmacologic comfort measures 07/16/2021 0725 by Dolores Hoose, RN Outcome: Progressing 07/15/2021 1818 by Dolores Hoose, RN Outcome: Progressing   Problem: Clinical Measurements: Goal: Ability to maintain clinical measurements within normal limits will improve Outcome: Progressing Goal: Will remain free from infection 07/16/2021 0725 by Dolores Hoose, RN Outcome: Progressing 07/15/2021 1818 by Dolores Hoose, RN Outcome: Progressing Goal: Diagnostic test results will improve Outcome: Progressing   Problem: Pain Managment: Goal: General experience of comfort will improve 07/16/2021 0725 by Dolores Hoose, RN Outcome: Progressing 07/15/2021 1818 by Dolores Hoose, RN Outcome: Progressing

## 2021-07-16 NOTE — Anesthesia Procedure Notes (Signed)
Procedure Name: Intubation Date/Time: 07/16/2021 7:53 AM Performed by: Mariea Clonts, CRNA Pre-anesthesia Checklist: Patient identified, Emergency Drugs available, Suction available and Patient being monitored Patient Re-evaluated:Patient Re-evaluated prior to induction Oxygen Delivery Method: Circle System Utilized Preoxygenation: Pre-oxygenation with 100% oxygen Induction Type: IV induction, Cricoid Pressure applied and Rapid sequence Laryngoscope Size: Miller and 2 Grade View: Grade I Tube type: Oral Tube size: 7.5 mm Number of attempts: 1 Airway Equipment and Method: Stylet and Oral airway Placement Confirmation: ETT inserted through vocal cords under direct vision, positive ETCO2 and breath sounds checked- equal and bilateral Tube secured with: Tape Dental Injury: Teeth and Oropharynx as per pre-operative assessment

## 2021-07-16 NOTE — Progress Notes (Signed)
Patient ID: Omar Travis, male   DOB: 07-May-1966, 55 y.o.   MRN: [id number] [facility] Surgery Progress Note  Day of Surgery  Subjective: CC-  Back from ERCP. Choledocholithiasis was found and complete removal was accomplished by biliary sphincterotomy, One pancreatic stent was placed into the ventral pancreatic duct. Feeling well. He reports minimal upper abdominal pain. No n/v. Tolerating clear liquids.  Objective: Vital signs in last 24 hours: Temp:  [97.6 F (36.4 C)-98.6 F (37 C)] 98.4 F (36.9 C) (10/25 0951) Pulse Rate:  [87-98] 87 (10/25 0951) Resp:  [16-19] 17 (10/25 0951) BP: (113-136)/(67-91) 113/74 (10/25 0951) SpO2:  [95 %-100 %] 95 % (10/25 0951) Weight:  [106.6 kg-107.6 kg] 106.6 kg (10/25 0700) Last BM Date: 07/14/21  Intake/Output from previous day: 10/24 0701 - 10/25 0700 In: 637.9 [P.O.:480; I.V.:0.2; IV Piggyback:157.7] Out: 0  Intake/Output this shift: Total I/O In: 617.5 [I.V.:559.2; IV Piggyback:58.3] Out: -   PE: Gen:  Alert, NAD, pleasant HEENT: EOM's intact, pupils equal and round Pulm:  rate and effort normal Abd: Soft, ND, mild RUQ TTP without rebound or guarding Psych: A&Ox4  Skin: jaundice, warm and dry  Lab Results:  Recent Labs    07/15/21 0451 07/16/21 0445  WBC 6.8 5.9  HGB 12.7* 12.6*  HCT 39.9 39.1  PLT 128* 137*   BMET Recent Labs    07/15/21 0451 07/16/21 0445  NA 134* 134*  K 3.9 3.7  CL 102 100  CO2 18* 24  GLUCOSE 71 97  BUN 13 12  CREATININE 1.08 0.94  CALCIUM 8.3* 8.5*   PT/INR Recent Labs    07/15/21 0451  LABPROT 14.7  INR 1.2   CMP     Component Value Date/Time   NA 134 (L) 07/16/2021 0445   K 3.7 07/16/2021 0445   CL 100 07/16/2021 0445   CO2 24 07/16/2021 0445   GLUCOSE 97 07/16/2021 0445   BUN 12 07/16/2021 0445   CREATININE 0.94 07/16/2021 0445   CALCIUM 8.5 (L) 07/16/2021 0445   PROT 5.8 (L) 07/16/2021 0445   ALBUMIN 2.8 (L) 07/16/2021 0445   AST 82 (H) 07/16/2021 0445    ALT 173 (H) 07/16/2021 0445   ALKPHOS 121 07/16/2021 0445   BILITOT 2.4 (H) 07/16/2021 0445   GFRNONAA >60 07/16/2021 0445   Lipase     Component Value Date/Time   LIPASE 29 07/16/2021 0445       Studies/Results: MR ABDOMEN MRCP WO CONTRAST  Result Date: 07/14/2021 CLINICAL DATA:  Jaundice. EXAM: MRI ABDOMEN WITHOUT CONTRAST  (INCLUDING MRCP) TECHNIQUE: Multiplanar multisequence MR imaging of the abdomen was performed. Heavily T2-weighted images of the biliary and pancreatic ducts were obtained, and three-dimensional MRCP images were rendered by post processing. COMPARISON:  Abdominopelvic CT same date. Ultrasound 07/11/2021 and MRI 08/19/2017. FINDINGS: Despite efforts by the technologist and patient, mild motion artifact is present on today's exam and could not be eliminated. This reduces exam sensitivity and specificity. Lower chest:  The visualized lower chest appears unremarkable. Hepatobiliary: No morphologic changes of cirrhosis or significant hepatic steatosis. The gallbladder is contracted with mild wall thickening. Small calcified gallstone seen on CT is not well visualized. Again demonstrated is chronic volume loss and intrahepatic biliary dilatation medially in the posterior right lobe of the liver, predominately segment 6. This is unchanged from the previous MRI. There is no other intrahepatic biliary dilatation. There is no extrahepatic biliary dilatation, however, there are small somewhat linear filling defects in the mid to distal  common bile duct, similar to those seen on previous MRCP. These are not calcified on today's CT. Pancreas: Unremarkable. No pancreatic ductal dilatation or surrounding inflammatory changes. Spleen: Stable mild splenomegaly without focal abnormality. Adrenals/Urinary Tract: Both adrenal glands appear normal. Renal sinus cysts are noted bilaterally without hydronephrosis or focal renal abnormality. Stomach/Bowel: The visualized bowel appears normal, without  distension, wall thickening or surrounding inflammation. Vascular/Lymphatic: There are no enlarged abdominal lymph nodes. No significant vascular findings. Other: No evidence of abdominal wall hernia or ascites. Musculoskeletal: No acute or significant osseous findings. IMPRESSION: 1. Overall findings are similar to those demonstrated on previous MRI from 08/19/2017. There are linear filling defects in the common bile duct without associated extrahepatic biliary dilatation. While these could reflect blood clots, consider biliary ascariasis. Recommend ERCP given the patient's jaundice. 2. Stable chronic intrahepatic biliary dilatation and volume loss posteriorly in the right hepatic lobe, likely benign based on stability from previous MRI's dating back to 07/12/2014. 3. Contracted gallbladder with mild nonspecific wall thickening, not impressive on recent ultrasound. 4. Bilateral renal sinus cysts. Electronically Signed   By: Richardean Sale M.D.   On: 07/14/2021 18:02   DG ERCP  Result Date: 07/16/2021 CLINICAL DATA:  ERCP for jaundice EXAM: ERCP TECHNIQUE: Multiple spot images obtained with the fluoroscopic device and submitted for interpretation post-procedure. FLUOROSCOPY TIME:  78 seconds (52.5 mGy) COMPARISON:  MRCP-07/14/2021; CT abdomen pelvis-07/14/2021 FINDINGS: Two spot intraoperative fluoroscopic images the right upper abdominal quadrant during ERCP provided for review. Initial image demonstrates an ERCP probe overlying the right upper abdominal quadrant. There is selective cannulation and opacification of the CBD which appears nondilated. Subsequent images demonstrate insufflation of a balloon within the distal aspect of the CBD with subsequent presumed biliary sweeping and sphincterotomy. IMPRESSION: ERCP with presumed biliary sweeping and sphincterotomy. These images were submitted for radiologic interpretation only. Please see the procedural report for the amount of contrast and the fluoroscopy  time utilized. Electronically Signed   By: Sandi Mariscal M.D.   On: 07/16/2021 09:45    Anti-infectives: Anti-infectives (From admission, onward)    Start     Dose/Rate Route Frequency Ordered Stop   07/14/21 2300  piperacillin-tazobactam (ZOSYN) IVPB 3.375 g       See Hyperspace for full Linked Orders Report.   3.375 g 12.5 mL/hr over 240 Minutes Intravenous Every 8 hours 07/14/21 1408     07/14/21 1415  piperacillin-tazobactam (ZOSYN) IVPB 3.375 g       See Hyperspace for full Linked Orders Report.   3.375 g 100 mL/hr over 30 Minutes Intravenous  Once 07/14/21 1408 07/14/21 2032   07/14/21 1130  piperacillin-tazobactam (ZOSYN) IVPB 3.375 g        3.375 g 100 mL/hr over 30 Minutes Intravenous  Once 07/14/21 1119 07/14/21 1301        Assessment/Plan Acute cholecystitis Pancreatitis - lipase 102>>29 Choledocholithiasis - s/p successful ERCP 10/25: Choledocholithiasis was found and complete removal was accomplished by biliary sphincterotomy, One pancreatic stent was placed into the ventral pancreatic duct - Tentatively plan for lap chole tomorrow. NPO after midnight. Repeat CBC, CMP, and lipase in AM.    ID - zosyn 10/23 FEN - IVF, CLD VTE - lovenox Foley - none   HTN HLD DM GERD   LOS: 2 days    Wellington Hampshire, [facility] Surgery 07/16/2021, 12:38 PM Please see Amion for pager number during day hours 7:00am-4:30pm

## 2021-07-16 NOTE — Progress Notes (Signed)
PROGRESS NOTE    Omar Travis  ZDG:[id number] DOB: 07/11/1966 DOA: 07/14/2021 PCP: Billie Ruddy, MD   Chief Complaint  Patient presents with   Abdominal Pain   Brief Narrative/Hospital Course:  Omar Travis, 56 y.o. male with PMH of chronic intrahepatic bile duct dilation followed by GI at [facility], insulin-dependent diabetes, hypertension, hyperlipidemia came with worsening right upper quad abdominal pain, jaundice, symptoms started on Monday with acute onset right upper quadrant pain seen by PCP and had LFTs done that were abnormal right upper quadrant ultrasound was done that showed a small stones with in gallbladder, outpatient HIDA scan was also ordered but not done he developed severe right upper quadrant pain on the day of admission and seen in the ED  In the ED febrile 101.2, abnormal LFTs CT abdomen pelvis showing acute and chronic cholecystitis, surgery and GI consulted and patient admitted. S/P MRCP concerning for choledocholithiasis.  Subjective: Patient is postop somewhat groggy/sleeping and did not disturb him.  Wife is at the bedside.  Underwent ERCP.  Assessment & Plan:  Acute calculus cholecystitis Acute cholangitis Obstructive jaundice Pancreatitis with lipase 102 Choledocholithiasis: LFTs/TB downtrending.  Status post ERCP this a.m.-, removed with sphincterotomy x2, balloon extraction, pancreatic stent placed.  Continue clear liquid diet for 6 hours, IV fluids repeat LFTs lipase in the morning.  Await surgical input for cholecystectomy likely tomorrow.  Continue on current antibiotics q/ zosyn, PPI, nausea medication, pain control with oral IV opiates  Mild hyponatremia-sodium stable Metabolic acidosis: Resolved  Essential hypertension: BP is controlled holding home meds for now  Diabetes on long-term insulin: HbA1c 9.2 poorly controlled on outpatient.  Sugar well controlled at this time on sliding scale insulin will need close follow-up with PCP Recent  Labs  Lab 07/15/21 1757 07/15/21 2111 07/15/21 2320 07/16/21 0423 07/16/21 0913  GLUCAP 70 163* 106* 102* 112*    HLD holding statin due to abnormal LFTs  Hepatic steatosis Obesity class I: BMI 31.8 Patient will benefit with weight loss outpatient sleep apnea evaluation and follow-up  DVT prophylaxis: SCD.  No chemical prophylaxis as per GI Code Status:   Code Status: Full Code Family Communication: plan of care discussed with patient and his wife at bedside. Status is: Inpatient  Remains inpatient appropriate because: For ongoing management of his cholangitis choledocholithiasis, cholecystitis Patient is medically not stable for discharge  Objective: Vitals last 24 hrs: Vitals:   07/16/21 0614 07/16/21 0700 07/16/21 0911 07/16/21 0951  BP: (!) 123/91 136/86  113/74  Pulse: 90 97  87  Resp: 18 16  17   Temp: 98.4 F (36.9 C) 97.6 F (36.4 C) 98.6 F (37 C) 98.4 F (36.9 C)  TempSrc: Oral Temporal    SpO2: 100%   95%  Weight:  106.6 kg    Height:  6' (1.829 m)     Weight change:   Intake/Output Summary (Last 24 hours) at 07/16/2021 1059 Last data filed at 07/16/2021 0908 Gross per 24 hour  Intake 1205.31 ml  Output 0 ml  Net 1205.31 ml    Net IO Since Admission: 1,305.31 mL [07/16/21 1059]   Physical Examination: General exam: Sleepy, pleasant not in distress HEENT:Oral mucosa moist, Ear/Nose WNL grossly, dentition normal. Respiratory system: bilaterally clear breath sounds, no use of accessory muscle Cardiovascular system: S1 & S2 +, No JVD,. Gastrointestinal system: Abdomen soft, mildly tender right upper quadrant,ND, BS+ Nervous System:Alert, awake, moving extremities and grossly nonfocal Extremities: no edema, distal peripheral pulses palpable.  Skin: No  rashes,no icterus. MSK: Normal muscle bulk,tone, power   Medications reviewed:  Scheduled Meds:  escitalopram  10 mg Oral Q1500   insulin aspart  0-9 Units Subcutaneous Q4H   pantoprazole  40 mg  Oral Daily   Continuous Infusions:  piperacillin-tazobactam (ZOSYN)  IV Stopped (07/16/21 4098)   Diet Order             Diet clear liquid Room service appropriate? Yes; Fluid consistency: Thin  Diet effective now                          Weight change:   Wt Readings from Last 3 Encounters:  07/16/21 106.6 kg  07/08/21 107.6 kg  07/30/20 110.5 kg     Consultants:see note  Procedures:see note Antimicrobials: Anti-infectives (From admission, onward)    Start     Dose/Rate Route Frequency Ordered Stop   07/14/21 2300  piperacillin-tazobactam (ZOSYN) IVPB 3.375 g       See Hyperspace for full Linked Orders Report.   3.375 g 12.5 mL/hr over 240 Minutes Intravenous Every 8 hours 07/14/21 1408     07/14/21 1415  piperacillin-tazobactam (ZOSYN) IVPB 3.375 g       See Hyperspace for full Linked Orders Report.   3.375 g 100 mL/hr over 30 Minutes Intravenous  Once 07/14/21 1408 07/14/21 2032   07/14/21 1130  piperacillin-tazobactam (ZOSYN) IVPB 3.375 g        3.375 g 100 mL/hr over 30 Minutes Intravenous  Once 07/14/21 1119 07/14/21 1301      Culture/Microbiology No results found for: SDES, SPECREQUEST, CULT, REPTSTATUS  Other culture-see note  Unresulted Labs (From admission, onward)     Start     Ordered   07/17/21 0500  Comprehensive metabolic panel  Tomorrow morning,   R       Question:  Specimen collection method  Answer:  Lab=Lab collect   07/16/21 0951   07/17/21 0500  CBC  Tomorrow morning,   STAT       Question:  Specimen collection method  Answer:  Lab=Lab collect   07/16/21 0951   07/14/21 0739  Lactic acid, plasma  Now then every 2 hours,   STAT      07/14/21 0738          Data Reviewed: I have personally reviewed following labs and imaging studies CBC: Recent Labs  Lab 07/14/21 0735 07/15/21 0451 07/16/21 0445  WBC 14.6* 6.8 5.9  NEUTROABS  --  5.2  --   HGB 15.1 12.7* 12.6*  HCT 47.9 39.9 39.1  MCV 86.9 87.3 85.0  PLT 205 128* 137*     Basic Metabolic Panel: Recent Labs  Lab 07/14/21 0735 07/15/21 0451 07/16/21 0445  NA 134* 134* 134*  K 4.4 3.9 3.7  CL 99 102 100  CO2 25 18* 24  GLUCOSE 186* 71 97  BUN 12 13 12   CREATININE 1.16 1.08 0.94  CALCIUM 9.2 8.3* 8.5*    GFR: Estimated Creatinine Clearance: 116 mL/min (by C-G formula based on SCr of 0.94 mg/dL). Liver Function Tests: Recent Labs  Lab 07/14/21 0735 07/15/21 0451 07/16/21 0445  AST 243* 219* 82*  ALT 188* 257* 173*  ALKPHOS 135* 111 121  BILITOT 4.4* 6.3* 2.4*  PROT 7.1 6.0* 5.8*  ALBUMIN 4.0 3.0* 2.8*    Recent Labs  Lab 07/14/21 0735 07/16/21 0445  LIPASE 102* 29    No results for input(s): AMMONIA in the last 168 hours.  Coagulation Profile: Recent Labs  Lab 07/15/21 0451  INR 1.2    Cardiac Enzymes: No results for input(s): CKTOTAL, CKMB, CKMBINDEX, TROPONINI in the last 168 hours. BNP (last 3 results) No results for input(s): PROBNP in the last 8760 hours. HbA1C: No results for input(s): HGBA1C in the last 72 hours. CBG: Recent Labs  Lab 07/15/21 1757 07/15/21 2111 07/15/21 2320 07/16/21 0423 07/16/21 0913  GLUCAP 70 163* 106* 102* 112*    Lipid Profile: No results for input(s): CHOL, HDL, LDLCALC, TRIG, CHOLHDL, LDLDIRECT in the last 72 hours. Thyroid Function Tests: No results for input(s): TSH, T4TOTAL, FREET4, T3FREE, THYROIDAB in the last 72 hours. Anemia Panel: No results for input(s): VITAMINB12, FOLATE, FERRITIN, TIBC, IRON, RETICCTPCT in the last 72 hours. Sepsis Labs: Recent Labs  Lab 07/14/21 9924  LATICACIDVEN 1.0     Recent Results (from the past 240 hour(s))  Resp Panel by RT-PCR (Flu A&B, Covid) Nasopharyngeal Swab     Status: None   Collection Time: 07/14/21 10:30 PM   Specimen: Nasopharyngeal Swab; Nasopharyngeal(NP) swabs in vial transport medium  Result Value Ref Range Status   SARS Coronavirus 2 by RT PCR NEGATIVE NEGATIVE Final    Comment: (NOTE) SARS-CoV-2 target nucleic  acids are NOT DETECTED.  The SARS-CoV-2 RNA is generally detectable in upper respiratory specimens during the acute phase of infection. The lowest concentration of SARS-CoV-2 viral copies this assay can detect is 138 copies/mL. A negative result does not preclude SARS-Cov-2 infection and should not be used as the sole basis for treatment or other patient management decisions. A negative result may occur with  improper specimen collection/handling, submission of specimen other than nasopharyngeal swab, presence of viral mutation(s) within the areas targeted by this assay, and inadequate number of viral copies(<138 copies/mL). A negative result must be combined with clinical observations, patient history, and epidemiological information. The expected result is Negative.  Fact Sheet for Patients:  EntrepreneurPulse.com.au  Fact Sheet for Healthcare Providers:  IncredibleEmployment.be  This test is no t yet approved or cleared by the [place] FDA and  has been authorized for detection and/or diagnosis of SARS-CoV-2 by FDA under an Emergency Use Authorization (EUA). This EUA will remain  in effect (meaning this test can be used) for the duration of the COVID-19 declaration under Section 564(b)(1) of the Act, 21 U.S.C.section 360bbb-3(b)(1), unless the authorization is terminated  or revoked sooner.       Influenza A by PCR NEGATIVE NEGATIVE Final   Influenza B by PCR NEGATIVE NEGATIVE Final    Comment: (NOTE) The Xpert Xpress SARS-CoV-2/FLU/RSV plus assay is intended as an aid in the diagnosis of influenza from Nasopharyngeal swab specimens and should not be used as a sole basis for treatment. Nasal washings and aspirates are unacceptable for Xpert Xpress SARS-CoV-2/FLU/RSV testing.  Fact Sheet for Patients: EntrepreneurPulse.com.au  Fact Sheet for Healthcare Providers: IncredibleEmployment.be  This  test is not yet approved or cleared by the [place] FDA and has been authorized for detection and/or diagnosis of SARS-CoV-2 by FDA under an Emergency Use Authorization (EUA). This EUA will remain in effect (meaning this test can be used) for the duration of the COVID-19 declaration under Section 564(b)(1) of the Act, 21 U.S.C. section 360bbb-3(b)(1), unless the authorization is terminated or revoked.  Performed at [facility], [place] [address], [place], [place] [address]       Radiology Studies: CT ABDOMEN PELVIS W CONTRAST  Result Date: 07/14/2021 CLINICAL DATA:  Abdominal pain, acute,  nonlocalized suspect choledocholithiasis EXAM: CT ABDOMEN AND PELVIS WITH CONTRAST TECHNIQUE: Multidetector CT imaging of the abdomen and pelvis was performed using the standard protocol following bolus administration of intravenous contrast. CONTRAST:  155mL OMNIPAQUE IOHEXOL 350 MG/ML SOLN COMPARISON:  August 19, 2017 FINDINGS: Lower chest: No acute abnormality. Hepatobiliary: Revisualization of intrahepatic biliary ductal dilation of predominately hepatic segment 6. This is similar in comparison to prior MRI. Unchanged appearance of the common bile duct. Gallbladder is completely decompressed. Cholelithiasis. There is adjacent haziness of the fat along the gallbladder. Hepatic steatosis. Pancreas: Unremarkable. No pancreatic ductal dilatation or surrounding inflammatory changes. Spleen: Unchanged mild splenomegaly. Adrenals/Urinary Tract: Adrenal glands are unremarkable. No hydronephrosis. Parapelvic cysts. Kidneys enhance symmetrically. No obstructing nephrolithiasis. Subcentimeter hypodense mass of the LEFT kidney is too small to accurately characterize. Bladder is unremarkable. Stomach/Bowel: No evidence of bowel obstruction. Appendix is normal. Moderate colonic stool burden diffusely throughout the colon. Misty mesentery, nonspecific. Vascular/Lymphatic: No significant vascular findings are  present. No enlarged retroperitoneal lymph nodes by size criteria. There are scattered mesenteric lymph nodes which are mildly prominent with representative prominent mesenteric lymph node measuring 8 mm in the short axis (series 3, image 38). Reproductive: Prostate is present. Other: No free air or free fluid. Musculoskeletal: No acute or significant osseous findings. IMPRESSION: 1. The gallbladder is decompressed around a cholelithiasis. However, there is haziness of the adjacent fat with subjective wall thickening of the gallbladder. Findings could reflect acute on chronic cholecystitis. No choledocholithiasis visualized. 2. Similar appearance of intrahepatic biliary ductal dilation of predominately hepatic segment 6. 3. Hepatic steatosis. 4. Misty mesentery, nonspecific Electronically Signed   By: Valentino Saxon M.D.   On: 07/14/2021 12:46   MR ABDOMEN MRCP WO CONTRAST  Result Date: 07/14/2021 CLINICAL DATA:  Jaundice. EXAM: MRI ABDOMEN WITHOUT CONTRAST  (INCLUDING MRCP) TECHNIQUE: Multiplanar multisequence MR imaging of the abdomen was performed. Heavily T2-weighted images of the biliary and pancreatic ducts were obtained, and three-dimensional MRCP images were rendered by post processing. COMPARISON:  Abdominopelvic CT same date. Ultrasound 07/11/2021 and MRI 08/19/2017. FINDINGS: Despite efforts by the technologist and patient, mild motion artifact is present on today's exam and could not be eliminated. This reduces exam sensitivity and specificity. Lower chest:  The visualized lower chest appears unremarkable. Hepatobiliary: No morphologic changes of cirrhosis or significant hepatic steatosis. The gallbladder is contracted with mild wall thickening. Small calcified gallstone seen on CT is not well visualized. Again demonstrated is chronic volume loss and intrahepatic biliary dilatation medially in the posterior right lobe of the liver, predominately segment 6. This is unchanged from the previous  MRI. There is no other intrahepatic biliary dilatation. There is no extrahepatic biliary dilatation, however, there are small somewhat linear filling defects in the mid to distal common bile duct, similar to those seen on previous MRCP. These are not calcified on today's CT. Pancreas: Unremarkable. No pancreatic ductal dilatation or surrounding inflammatory changes. Spleen: Stable mild splenomegaly without focal abnormality. Adrenals/Urinary Tract: Both adrenal glands appear normal. Renal sinus cysts are noted bilaterally without hydronephrosis or focal renal abnormality. Stomach/Bowel: The visualized bowel appears normal, without distension, wall thickening or surrounding inflammation. Vascular/Lymphatic: There are no enlarged abdominal lymph nodes. No significant vascular findings. Other: No evidence of abdominal wall hernia or ascites. Musculoskeletal: No acute or significant osseous findings. IMPRESSION: 1. Overall findings are similar to those demonstrated on previous MRI from 08/19/2017. There are linear filling defects in the common bile duct without associated extrahepatic biliary dilatation. While these could reflect  blood clots, consider biliary ascariasis. Recommend ERCP given the patient's jaundice. 2. Stable chronic intrahepatic biliary dilatation and volume loss posteriorly in the right hepatic lobe, likely benign based on stability from previous MRI's dating back to 07/12/2014. 3. Contracted gallbladder with mild nonspecific wall thickening, not impressive on recent ultrasound. 4. Bilateral renal sinus cysts. Electronically Signed   By: Richardean Sale M.D.   On: 07/14/2021 18:02     LOS: 2 days   Antonieta Pert, MD [facility]  07/16/2021, 10:59 AM

## 2021-07-16 NOTE — Plan of Care (Signed)
  Problem: Clinical Measurements: Goal: Diagnostic test results will improve Outcome: Completed/Met

## 2021-07-16 NOTE — Plan of Care (Signed)
  Problem: Health Behavior/Discharge Planning: Goal: Ability to manage health-related needs will improve Outcome: Progressing   

## 2021-07-16 NOTE — Progress Notes (Signed)
Omar Travis 7:49 AM  Subjective: Patient doing much better without any new complaints in his hospital computer chart reviewed and his case discussed with my partner Dr. Michail Sermon and our PA  Objective: Vital signs stable afebrile no acute distress exam please see preassessment evaluation CBC okay liver test decreased MRCP reviewed  Assessment: Probable CBD stone  Plan: We again discussed ERCP including risk benefits methods and success rate and will proceed this morning with anesthesia assistance with further work-up and plans pending those findings  [facility]  office [phone] After 5PM or if no answer call [phone]

## 2021-07-16 NOTE — Transfer of Care (Signed)
Immediate Anesthesia Transfer of Care Note  Patient: Omar Travis  Procedure(s) Performed: ENDOSCOPIC RETROGRADE CHOLANGIOPANCREATOGRAPHY (ERCP) WITH PROPOFOL SPHINCTEROTOMY REMOVAL OF STONES PANCREATIC STENT PLACEMENT  Patient Location: PACU  Anesthesia Type:General  Level of Consciousness: awake, alert  and oriented  Airway & Oxygen Therapy: Patient Spontanous Breathing and Patient connected to nasal cannula oxygen  Post-op Assessment: Report given to RN and Post -op Vital signs reviewed and stable  Post vital signs: Reviewed and stable  Last Vitals:  Vitals Value Taken Time  BP 116/84 07/16/21 0911  Temp    Pulse 93 07/16/21 0914  Resp 15 07/16/21 0914  SpO2 95 % 07/16/21 0914  Vitals shown include unvalidated device data.  Last Pain:  Vitals:   07/16/21 0700  TempSrc: Temporal  PainSc: 0-No pain         Complications: No notable events documented.

## 2021-07-16 NOTE — TOC Initial Note (Signed)
Transition of Care [facility]) - Initial/Assessment Note    Patient Details  Name: Omar Travis MRN: [id number] Date of Birth: 1966/06/29  Transition of Care [facility] CM/SW Contact:    Tom-Johnson, Renea Ee, RN Phone Number: 07/16/2021, 3:34 PM  Clinical Narrative:                 CM spoke with patient and wife at bedside about needs for post hospital transition. Presented with right upper quadrant abdominal pain. Patient states he lives with his wife and daughter. Has two children. Has six siblings, mother living and all are supportive with care. Currently employed by [employer]. Independent with care and drives self prior to hospitalization. Does not have and does not use DME's. PCP is Grier Mitts. Uses [facility] in [place] for his medicines. Has [employer]. ERCP done today for Choledocholithiasis and tentative Laparoscopic Cholecystectomy tomorrow. CM will continue to follow with needs.   Expected Discharge Plan: Home/Self Care Barriers to Discharge: Continued Medical Work up   Patient Goals and CMS Choice Patient states their goals for this hospitalization and ongoing recovery are:: To go home with wife. CMS Medicare.gov Compare Post Acute Care list provided to:: Patient    Expected Discharge Plan and Services Expected Discharge Plan: Home/Self Care   Discharge Planning Services: CM Consult   Living arrangements for the past 2 months: Single Family Home                 DME Arranged: N/A DME Agency: NA                  Prior Living Arrangements/Services Living arrangements for the past 2 months: Single Family Home Lives with:: Spouse, Adult Children (daughter) Patient language and need for interpreter reviewed:: Yes Do you feel safe going back to the place where you live?: Yes      Need for Family Participation in Patient Care: Yes (Comment) Care giver support system in place?: Yes (comment)   Criminal Activity/Legal  Involvement Pertinent to Current Situation/Hospitalization: No - Comment as needed  Activities of Daily Living      Permission Sought/Granted Permission sought to share information with : Case Manager, Family Supports Permission granted to share information with : Yes, Verbal Permission Granted              Emotional Assessment Appearance:: Appears stated age Attitude/Demeanor/Rapport: Engaged Affect (typically observed): Accepting, Appropriate, Calm, Hopeful Orientation: : Oriented to Self, Oriented to Place, Oriented to  Time, Oriented to Situation Alcohol / Substance Use: Not Applicable Psych Involvement: No (comment)  Admission diagnosis:  Cholangitis [K83.09] Choledocholithiasis with acute cholecystitis [K80.42] Patient Active Problem List   Diagnosis Date Noted   Cholangitis 07/14/2021   Choledocholithiasis with acute cholecystitis    Depression, recurrent ([place]) 11/24/2019   Gastroesophageal reflux disease 06/22/2019   Hyperlipidemia 06/22/2019   Diabetes mellitus ([place])    Essential hypertension    PCP:  Billie Ruddy, MD Pharmacy:   [facility], [place] [address] Phone: [phone] Fax: [phone]     Social Determinants of Health (SDOH) Interventions    Readmission Risk Interventions No flowsheet data found.

## 2021-07-16 NOTE — Op Note (Signed)
[facility] Patient Name: Omar Travis Procedure Date : 07/16/2021 MRN: [id number] Attending MD: Clarene Essex , MD Date of Birth: Aug 08, 1966 CSN: [id number] Age: 55 Admit Type: Inpatient Procedure:                ERCP Indications:              For therapy of bile duct stone(s) elevated liver                            tests positive MRCP Providers:                Clarene Essex, MD, Jeanella Cara, RN, Frazier Richards, Technician, Cherylynn Ridges, Technician Referring MD:              Medicines:                General Anesthesia Complications:            No immediate complications. Estimated Blood Loss:     Estimated blood loss: none. Procedure:                Pre-Anesthesia Assessment:                           - Prior to the procedure, a History and Physical                            was performed, and patient medications and                            allergies were reviewed. The patient's tolerance of                            previous anesthesia was also reviewed. The risks                            and benefits of the procedure and the sedation                            options and risks were discussed with the patient.                            All questions were answered, and informed consent                            was obtained. Prior Anticoagulants: The patient has                            taken no previous anticoagulant or antiplatelet                            agents. ASA Grade Assessment: II - A patient with  mild systemic disease. After reviewing the risks                            and benefits, the patient was deemed in                            satisfactory condition to undergo the procedure.                           After obtaining informed consent, the scope was                            passed under direct vision. Throughout the                            procedure, the patient's  blood pressure, pulse, and                            oxygen saturations were monitored continuously. The                            [id number]-Q180V ([id number] Olympus duodenoscope was                            introduced through the mouth, and used to inject                            contrast into and used to locate the major papilla.                            The ERCP was somewhat difficult. Successful                            completion of the procedure was aided by performing                            the maneuvers documented (below) in this report.                            The patient tolerated the procedure well. Scope In: Scope Out: Findings:      The major papilla was normal. Unfortunately the wire went a few times       towards the pancreas so elected to place one 4 Fr by 3 cm pancreatic       stent with a 3/4 external pigtail and no internal flaps was placed 2 cm       into the ventral pancreatic duct. The stent was in good position. With a       little difficulty we were able to cannulate next to the stent in the       customary fashion and deep selective cannulation was obtained and       obvious stones were seen on initial cholangiogram and we proceeded with       a biliary sphincterotomy was made with a Hydratome sphincterotome using  ERBE electrocautery. There was no post-sphincterotomy bleeding. We had       adequate biliary drainage and could get the fully bowed sphincterotome       in and out of the duct at this point and choledocholithiasis was found       in a nondilated duct. The biliary tree was swept with an adjustable 9-       12 mm balloon starting at the bifurcation. Some stones and sludge were       removed. But we had difficulty pulling the 9 mm balloon through the       distal duct sludge was swept from the duct. So we exchanged the balloon       for the sphincterotome and increase the sphincterotomy site a second       time again until there was  adequate biliary drainage and we could get       the fully bowed sphincterotome in and out of the duct and we return to       the adjustable balloon and multiple pull-through's were done using both       size balloons and all stones were removed. Both balloons passed readily       through the pain sphincterotomy site and on occlusion cholangiogram       after multiple negative pull-through's nothing was found and there was       excellent biliary drainage and the wire and the balloon were removed. Impression:               - The major papilla appeared normal.                           - Choledocholithiasis was found. Complete removal                            was accomplished by biliary sphincterotomyx2 as                            above and multiple balloon extraction.                           - One pancreatic stent was placed into the ventral                            pancreatic duct.                           - A biliary sphincterotomy was performed and                            enlarged as above.                           - The biliary tree was swept and nothing was found                            at the end of the procedure and there was adequate                            biliary  drainage. Recommendation:           - Clear liquid diet for 6 hours.                           - Continue present medications.                           - Return to GI clinic PRN.                           - Telephone GI clinic if symptomatic PRN.                           - Refer to a surgeon today for probable lap chole                            tomorrow imaging no delayed complications.                           - Check liver enzymes (AST, ALT, alkaline                            phosphatase, bilirubin) and hemogram with white                            blood cell count and platelets in the morning. And                            follow back to normal as an outpatient and he will                             need a abdominal x-ray in 1 to 2 weeks to confirm                            passing of the pancreatic stent Procedure Code(s):        --- Professional ---                           [phone], Esophagogastroduodenoscopy, flexible,                            transoral; diagnostic, including collection of                            specimen(s) by brushing or washing, when performed                            (separate procedure) Diagnosis Code(s):        --- Professional ---                           K80.50, Calculus of bile duct without cholangitis  or cholecystitis without obstruction CPT copyright 2019 [employer]. All rights reserved. The codes documented in this report are preliminary and upon coder review may  be revised to meet current compliance requirements. Clarene Essex, MD 07/16/2021 9:12:33 AM This report has been signed electronically. Number of Addenda: 0

## 2021-07-16 NOTE — Anesthesia Postprocedure Evaluation (Signed)
Anesthesia Post Note  Patient: FUE CERVENKA  Procedure(s) Performed: ENDOSCOPIC RETROGRADE CHOLANGIOPANCREATOGRAPHY (ERCP) WITH PROPOFOL SPHINCTEROTOMY REMOVAL OF STONES PANCREATIC STENT PLACEMENT     Patient location during evaluation: PACU Anesthesia Type: General Level of consciousness: sedated Pain management: pain level controlled Vital Signs Assessment: post-procedure vital signs reviewed and stable Respiratory status: spontaneous breathing and respiratory function stable Cardiovascular status: stable Postop Assessment: no apparent nausea or vomiting Anesthetic complications: no   No notable events documented.  Last Vitals:  Vitals:   07/16/21 0911 07/16/21 0951  BP:  113/74  Pulse:  87  Resp:  17  Temp: 37 C 36.9 C  SpO2:  95%    Last Pain:  Vitals:   07/16/21 1000  TempSrc:   PainSc: 4                  Felix Pratt DANIEL

## 2021-07-17 ENCOUNTER — Ambulatory Visit (HOSPITAL_BASED_OUTPATIENT_CLINIC_OR_DEPARTMENT_OTHER): Payer: BC Managed Care – PPO

## 2021-07-17 ENCOUNTER — Inpatient Hospital Stay (HOSPITAL_COMMUNITY): Payer: BC Managed Care – PPO | Admitting: Anesthesiology

## 2021-07-17 ENCOUNTER — Encounter (HOSPITAL_COMMUNITY): Payer: Self-pay | Admitting: Internal Medicine

## 2021-07-17 ENCOUNTER — Encounter (HOSPITAL_COMMUNITY)
Admission: EM | Disposition: A | Discharge status: Home / Self Care | Payer: Self-pay | Source: Home / Self Care | Attending: Internal Medicine

## 2021-07-17 HISTORY — PX: CHOLECYSTECTOMY: SHX55

## 2021-07-17 LAB — COMPREHENSIVE METABOLIC PANEL
ALT: 115 U/L — ABNORMAL HIGH (ref 0–44)
AST: 36 U/L (ref 15–41)
Albumin: 2.8 g/dL — ABNORMAL LOW (ref 3.5–5.0)
Alkaline Phosphatase: 108 U/L (ref 38–126)
Anion gap: 9 (ref 5–15)
BUN: 11 mg/dL (ref 6–20)
CO2: 25 mmol/L (ref 22–32)
Calcium: 8.6 mg/dL — ABNORMAL LOW (ref 8.9–10.3)
Chloride: 102 mmol/L (ref 98–111)
Creatinine, Ser: 0.87 mg/dL (ref 0.61–1.24)
GFR, Estimated: >60 mL/min (ref >60)
Glucose, Bld: 107 mg/dL — ABNORMAL HIGH (ref 70–99)
Potassium: 3.8 mmol/L (ref 3.5–5.1)
Sodium: 136 mmol/L (ref 135–145)
Total Bilirubin: 1.8 mg/dL — ABNORMAL HIGH (ref 0.3–1.2)
Total Protein: 5.6 g/dL — ABNORMAL LOW (ref 6.5–8.1)

## 2021-07-17 LAB — SURGICAL PCR SCREEN
MRSA, PCR: NEGATIVE
Staphylococcus aureus: POSITIVE — AB

## 2021-07-17 LAB — GLUCOSE, CAPILLARY
Glucose-Capillary: 105 mg/dL — ABNORMAL HIGH (ref 70–99)
Glucose-Capillary: 109 mg/dL — ABNORMAL HIGH (ref 70–99)
Glucose-Capillary: 131 mg/dL — ABNORMAL HIGH (ref 70–99)
Glucose-Capillary: 121 mg/dL — ABNORMAL HIGH (ref 70–99)
Glucose-Capillary: 207 mg/dL — ABNORMAL HIGH (ref 70–99)

## 2021-07-17 LAB — CBC
HCT: 37.9 % — ABNORMAL LOW (ref 39.0–52.0)
Hemoglobin: 12.2 g/dL — ABNORMAL LOW (ref 13.0–17.0)
MCH: 27.5 pg (ref 26.0–34.0)
MCHC: 32.2 g/dL (ref 30.0–36.0)
MCV: 85.4 fL (ref 80.0–100.0)
Platelets: 132 10*3/uL — ABNORMAL LOW (ref 150–400)
RBC: 4.44 MIL/uL (ref 4.22–5.81)
RDW: 13.7 % (ref 11.5–15.5)
WBC: 4.5 10*3/uL (ref 4.0–10.5)
nRBC: 0 % (ref 0.0–0.2)

## 2021-07-17 LAB — LIPASE, BLOOD: Lipase: 32 U/L (ref 11–51)

## 2021-07-17 SURGERY — LAPAROSCOPIC CHOLECYSTECTOMY
Anesthesia: General | Site: Abdomen

## 2021-07-17 MED ORDER — IPRATROPIUM-ALBUTEROL 0.5-2.5 (3) MG/3ML IN SOLN
3.0000 mL | RESPIRATORY_TRACT | Status: DC | PRN
Start: 2021-07-17 — End: 2021-07-17

## 2021-07-17 MED ORDER — DEXAMETHASONE SODIUM PHOSPHATE 10 MG/ML IJ SOLN
INTRAMUSCULAR | Status: AC
  Filled 2021-07-17: qty 3

## 2021-07-17 MED ORDER — OXYCODONE HCL 5 MG PO TABS: 5.0000 mg | ORAL_TABLET | Freq: Four times a day (QID) | ORAL | 0 refills | Status: DC | PRN

## 2021-07-17 MED ORDER — LIDOCAINE 2% (20 MG/ML) 5 ML SYRINGE
INTRAMUSCULAR | Status: AC
  Filled 2021-07-17: qty 15

## 2021-07-17 MED ORDER — PROPOFOL 10 MG/ML IV BOLUS
INTRAVENOUS | Status: DC | PRN
  Administered 2021-07-17: 200 mg via INTRAVENOUS

## 2021-07-17 MED ORDER — FENTANYL CITRATE (PF) 250 MCG/5ML IJ SOLN
INTRAMUSCULAR | Status: AC
  Filled 2021-07-17: qty 5

## 2021-07-17 MED ORDER — ROCURONIUM BROMIDE 10 MG/ML (PF) SYRINGE
PREFILLED_SYRINGE | INTRAVENOUS | Status: DC | PRN
  Administered 2021-07-17: 50 mg via INTRAVENOUS

## 2021-07-17 MED ORDER — CHLORHEXIDINE GLUCONATE 0.12 % MT SOLN: 15.0000 mL | Freq: Once | OROMUCOSAL | Status: AC

## 2021-07-17 MED ORDER — DIPHENHYDRAMINE HCL 50 MG/ML IJ SOLN
INTRAMUSCULAR | Status: AC
  Filled 2021-07-17: qty 1

## 2021-07-17 MED ORDER — PHENYLEPHRINE 40 MCG/ML (10ML) SYRINGE FOR IV PUSH (FOR BLOOD PRESSURE SUPPORT)
PREFILLED_SYRINGE | INTRAVENOUS | Status: AC
  Filled 2021-07-17: qty 30

## 2021-07-17 MED ORDER — LIDOCAINE 2% (20 MG/ML) 5 ML SYRINGE
INTRAMUSCULAR | Status: DC | PRN
  Administered 2021-07-17: 100 mg via INTRAVENOUS

## 2021-07-17 MED ORDER — ONDANSETRON HCL 4 MG/2ML IJ SOLN
INTRAMUSCULAR | Status: AC
  Filled 2021-07-17: qty 6

## 2021-07-17 MED ORDER — CHLORHEXIDINE GLUCONATE CLOTH 2 % EX PADS: 6.0000 | MEDICATED_PAD | Freq: Every day | CUTANEOUS | Status: DC

## 2021-07-17 MED ORDER — PROPOFOL 10 MG/ML IV BOLUS
INTRAVENOUS | Status: AC
  Filled 2021-07-17: qty 20

## 2021-07-17 MED ORDER — MIDAZOLAM HCL 5 MG/5ML IJ SOLN
INTRAMUSCULAR | Status: DC | PRN
  Administered 2021-07-17: 2 mg via INTRAVENOUS

## 2021-07-17 MED ORDER — MUPIROCIN 2 % EX OINT
1.0000 "application " | TOPICAL_OINTMENT | Freq: Two times a day (BID) | CUTANEOUS | Status: DC
  Administered 2021-07-17: 1 via NASAL
  Filled 2021-07-17: qty 22

## 2021-07-17 MED ORDER — HYDRALAZINE HCL 20 MG/ML IJ SOLN: 10.0000 mg | INTRAMUSCULAR | Status: DC | PRN

## 2021-07-17 MED ORDER — SUCCINYLCHOLINE CHLORIDE 200 MG/10ML IV SOSY
PREFILLED_SYRINGE | INTRAVENOUS | Status: AC
  Filled 2021-07-17: qty 10

## 2021-07-17 MED ORDER — PROMETHAZINE HCL 25 MG/ML IJ SOLN: 6.2500 mg | INTRAMUSCULAR | Status: DC | PRN

## 2021-07-17 MED ORDER — FENTANYL CITRATE (PF) 250 MCG/5ML IJ SOLN
INTRAMUSCULAR | Status: DC | PRN
  Administered 2021-07-17 (×4): 50 ug via INTRAVENOUS

## 2021-07-17 MED ORDER — MEPERIDINE HCL 25 MG/ML IJ SOLN: 6.2500 mg | INTRAMUSCULAR | Status: DC | PRN

## 2021-07-17 MED ORDER — OXYCODONE HCL 5 MG PO TABS: 5.0000 mg | ORAL_TABLET | ORAL | Status: DC | PRN

## 2021-07-17 MED ORDER — HYDROMORPHONE HCL 1 MG/ML IJ SOLN
0.5000 mg | INTRAMUSCULAR | Status: DC | PRN
  Administered 2021-07-17: 0.5 mg via INTRAVENOUS
  Filled 2021-07-17: qty 1

## 2021-07-17 MED ORDER — FENTANYL CITRATE (PF) 100 MCG/2ML IJ SOLN: 25.0000 ug | INTRAMUSCULAR | Status: DC | PRN

## 2021-07-17 MED ORDER — ROCURONIUM BROMIDE 10 MG/ML (PF) SYRINGE
PREFILLED_SYRINGE | INTRAVENOUS | Status: AC
  Filled 2021-07-17: qty 30

## 2021-07-17 MED ORDER — CHLORHEXIDINE GLUCONATE 0.12 % MT SOLN
OROMUCOSAL | Status: AC
  Administered 2021-07-17: 15 mL via OROMUCOSAL
  Filled 2021-07-17: qty 15

## 2021-07-17 MED ORDER — EPHEDRINE 5 MG/ML INJ
INTRAVENOUS | Status: AC
  Filled 2021-07-17: qty 5

## 2021-07-17 MED ORDER — OXYCODONE HCL 5 MG PO TABS
5.0000 mg | ORAL_TABLET | ORAL | Status: DC | PRN
Start: 2021-07-17 — End: 2021-07-17

## 2021-07-17 MED ORDER — ORAL CARE MOUTH RINSE: 15.0000 mL | Freq: Once | OROMUCOSAL | Status: AC

## 2021-07-17 MED ORDER — AMISULPRIDE (ANTIEMETIC) 5 MG/2ML IV SOLN: 10.0000 mg | Freq: Once | INTRAVENOUS | Status: DC | PRN

## 2021-07-17 MED ORDER — SUGAMMADEX SODIUM 200 MG/2ML IV SOLN
INTRAVENOUS | Status: DC | PRN
  Administered 2021-07-17: 200 mg via INTRAVENOUS

## 2021-07-17 MED ORDER — MIDAZOLAM HCL 2 MG/2ML IJ SOLN
INTRAMUSCULAR | Status: AC
  Filled 2021-07-17: qty 2

## 2021-07-17 MED ORDER — HYDROMORPHONE HCL 1 MG/ML IJ SOLN: 0.2500 mg | INTRAMUSCULAR | Status: DC | PRN

## 2021-07-17 MED ORDER — BUPIVACAINE-EPINEPHRINE 0.25% -1:200000 IJ SOLN
INTRAMUSCULAR | Status: DC | PRN
  Administered 2021-07-17: 25 mL

## 2021-07-17 MED ORDER — SENNA 8.6 MG PO TABS: 2.0000 | ORAL_TABLET | Freq: Every evening | ORAL | 0 refills | Status: DC | PRN

## 2021-07-17 MED ORDER — LACTATED RINGERS IV SOLN: INTRAVENOUS | Status: DC

## 2021-07-17 MED ORDER — ONDANSETRON HCL 4 MG/2ML IJ SOLN
INTRAMUSCULAR | Status: DC | PRN
  Administered 2021-07-17: 4 mg via INTRAVENOUS

## 2021-07-17 MED ORDER — TRAZODONE HCL 50 MG PO TABS
50.0000 mg | ORAL_TABLET | Freq: Every evening | ORAL | Status: DC | PRN
Start: 2021-07-17 — End: 2021-07-17

## 2021-07-17 MED ORDER — SODIUM CHLORIDE 0.9 % IR SOLN
Status: DC | PRN
  Administered 2021-07-17: 500 mL

## 2021-07-17 MED ORDER — BUPIVACAINE HCL (PF) 0.25 % IJ SOLN
INTRAMUSCULAR | Status: AC
  Filled 2021-07-17: qty 30

## 2021-07-17 MED ORDER — DEXAMETHASONE SODIUM PHOSPHATE 10 MG/ML IJ SOLN
INTRAMUSCULAR | Status: DC | PRN
  Administered 2021-07-17: 4 mg via INTRAVENOUS

## 2021-07-17 SURGICAL SUPPLY — 40 items
ADH SKN CLS APL DERMABOND .7 (GAUZE/BANDAGES/DRESSINGS) ×1
APL PRP STRL LF DISP 70% ISPRP (MISCELLANEOUS) ×1
APPLIER CLIP 5 13 M/L LIGAMAX5 (MISCELLANEOUS) ×2
APR CLP MED LRG 5 ANG JAW (MISCELLANEOUS) ×1
BAG COUNTER SPONGE SURGICOUNT (BAG) ×2 IMPLANT
BAG SPEC RTRVL LRG 6X4 10 (ENDOMECHANICALS) ×1
BAG SPNG CNTER NS LX DISP (BAG) ×1
BLADE CLIPPER SURG (BLADE) ×1 IMPLANT
CANISTER SUCT 3000ML PPV (MISCELLANEOUS) ×2 IMPLANT
CHLORAPREP W/TINT 26 (MISCELLANEOUS) ×2 IMPLANT
CLIP APPLIE 5 13 M/L LIGAMAX5 (MISCELLANEOUS) ×1 IMPLANT
COVER SURGICAL LIGHT HANDLE (MISCELLANEOUS) ×2 IMPLANT
DERMABOND ADVANCED (GAUZE/BANDAGES/DRESSINGS) ×1
DERMABOND ADVANCED .7 DNX12 (GAUZE/BANDAGES/DRESSINGS) ×1 IMPLANT
ELECT REM PT RETURN 9FT ADLT (ELECTROSURGICAL) ×2
ELECTRODE REM PT RTRN 9FT ADLT (ELECTROSURGICAL) ×1 IMPLANT
GLOVE SURG ENC MOIS LTX SZ6 (GLOVE) ×2 IMPLANT
GLOVE SURG UNDER LTX SZ6.5 (GLOVE) ×2 IMPLANT
GOWN STRL REUS W/ TWL LRG LVL3 (GOWN DISPOSABLE) ×3 IMPLANT
GOWN STRL REUS W/TWL LRG LVL3 (GOWN DISPOSABLE) ×8
GRASPER SUT TROCAR 14GX15 (MISCELLANEOUS) ×2 IMPLANT
KIT BASIN OR (CUSTOM PROCEDURE TRAY) ×2 IMPLANT
KIT TURNOVER KIT B (KITS) ×2 IMPLANT
NDL INSUFFLATION 14GA 120MM (NEEDLE) ×1 IMPLANT
NEEDLE INSUFFLATION 14GA 120MM (NEEDLE) ×2 IMPLANT
NS IRRIG 1000ML POUR BTL (IV SOLUTION) ×2 IMPLANT
PAD ARMBOARD 7.5X6 YLW CONV (MISCELLANEOUS) ×2 IMPLANT
POUCH SPECIMEN RETRIEVAL 10MM (ENDOMECHANICALS) ×2 IMPLANT
SCISSORS LAP 5X35 DISP (ENDOMECHANICALS) ×2 IMPLANT
SET IRRIG TUBING LAPAROSCOPIC (IRRIGATION / IRRIGATOR) ×2 IMPLANT
SET TUBE SMOKE EVAC HIGH FLOW (TUBING) ×2 IMPLANT
SLEEVE ENDOPATH XCEL 5M (ENDOMECHANICALS) ×4 IMPLANT
SPECIMEN JAR SMALL (MISCELLANEOUS) ×2 IMPLANT
SUT MNCRL AB 4-0 PS2 18 (SUTURE) ×2 IMPLANT
TOWEL GREEN STERILE (TOWEL DISPOSABLE) IMPLANT
TOWEL GREEN STERILE FF (TOWEL DISPOSABLE) ×2 IMPLANT
TRAY LAPAROSCOPIC MC (CUSTOM PROCEDURE TRAY) ×2 IMPLANT
TROCAR XCEL NON-BLD 11X100MML (ENDOMECHANICALS) ×2 IMPLANT
TROCAR XCEL NON-BLD 5MMX100MML (ENDOMECHANICALS) ×2 IMPLANT
WATER STERILE IRR 1000ML POUR (IV SOLUTION) ×2 IMPLANT

## 2021-07-17 NOTE — Plan of Care (Signed)
  Problem: Health Behavior/Discharge Planning: Goal: Ability to manage health-related needs will improve Outcome: Completed/Met   Problem: Clinical Measurements: Goal: Ability to maintain clinical measurements within normal limits will improve Outcome: Completed/Met Goal: Will remain free from infection Outcome: Completed/Met Goal: Respiratory complications will improve Outcome: Completed/Met Goal: Cardiovascular complication will be avoided Outcome: Completed/Met   Problem: Activity: Goal: Risk for activity intolerance will decrease Outcome: Completed/Met   Problem: Nutrition: Goal: Adequate nutrition will be maintained Outcome: Completed/Met   Problem: Coping: Goal: Level of anxiety will decrease Outcome: Completed/Met   Problem: Elimination: Goal: Will not experience complications related to bowel motility Outcome: Completed/Met Goal: Will not experience complications related to urinary retention Outcome: Completed/Met   Problem: Pain Managment: Goal: General experience of comfort will improve Outcome: Completed/Met   Problem: Safety: Goal: Ability to remain free from injury will improve Outcome: Completed/Met   Problem: Skin Integrity: Goal: Risk for impaired skin integrity will decrease Outcome: Completed/Met

## 2021-07-17 NOTE — Anesthesia Procedure Notes (Signed)
Procedure Name: Intubation Date/Time: 07/17/2021 10:16 AM Performed by: Wilburn Cornelia, CRNA Pre-anesthesia Checklist: Patient identified, Suction available, Emergency Drugs available, Patient being monitored and Timeout performed Patient Re-evaluated:Patient Re-evaluated prior to induction Oxygen Delivery Method: Circle system utilized Preoxygenation: Pre-oxygenation with 100% oxygen Induction Type: IV induction Ventilation: Mask ventilation without difficulty Laryngoscope Size: Mac and 4 Grade View: Grade II Tube type: Oral Tube size: 7.5 mm Airway Equipment and Method: Stylet Placement Confirmation: ETT inserted through vocal cords under direct vision, positive ETCO2, CO2 detector and breath sounds checked- equal and bilateral Secured at: 23 cm Tube secured with: Tape Dental Injury: Teeth and Oropharynx as per pre-operative assessment

## 2021-07-17 NOTE — Discharge Summary (Signed)
Physician Discharge Summary  Omar Travis ZOX:[id number] DOB: 1966/08/18 DOA: 07/14/2021  PCP: Billie Ruddy, MD  Admit date: 07/14/2021 Discharge date: 07/17/2021  Admitted From: Home Disposition: Home  Recommendations for Outpatient Follow-up:  Follow up with PCP in 1-2 weeks Please obtain CMP/CBC in one week your next doctors visit.  Pain meds or bowel regimen prescribed He will need outpatient follow-up with gastroenterology and abdominal x-ray in 1-2 weeks to confirm passing of the pancreatic stent   Discharge Condition: Stable CODE STATUS: Full code Diet recommendation: Diabetic as tolerated  Brief/Interim Summary: 55 y.o. male with PMH of chronic intrahepatic bile duct dilation followed by GI at [facility], insulin-dependent diabetes, hypertension, hyperlipidemia came with worsening right upper quad abdominal pain, jaundice, symptoms started on Monday with acute onset right upper quadrant pain seen by PCP and had LFTs done that were abnormal right upper quadrant ultrasound was done that showed a small stones with in gallbladder, outpatient HIDA scan was also ordered but not done he developed severe right upper quadrant pain on the day of admission and seen in the ED   In the ED febrile 101.2, abnormal LFTs CT abdomen pelvis showing acute and chronic cholecystitis, surgery and GI consulted and patient admitted. S/P MRCP concerning for choledocholithiasis.  Underwent ERCP requiring sphincterotomy, balloon extraction and pancreatic stent placement and eventually laparoscopic cholecystectomy on 10/26 which he tolerated well.  He is medically stable for discharge today as he is tolerating oral diet.     Assessment & Plan:   Active Problems:   Cholangitis   Acute calculus cholecystitis status post laparoscopic cholecystectomy Acute cholangitis Obstructive jaundice Pancreatitis with lipase 102 Choledocholithiasis: -LFTs improving status post ERCP -removed with sphincterotomy x2,  balloon extraction, pancreatic stent placed.  Also underwent laparoscopic cholecystectomy on 10/26 which he tolerated well.  Hematic.,  Pain control.  Diet as tolerated.  He is cleared by general surgery today to be discharged.  Tolerating diet well.  Pain is well controlled.   Mild hyponatremia-sodium stable Metabolic acidosis: Resolved   Essential hypertension: BP is controlled holding home meds for now   Diabetes on long-term insulin, uncontrolled due to hyperglycemia: HbA1c 9.2 poorly controlled on outpatient.  Resume his home regimen.   HLD-statin on hold   Hepatic steatosis Obesity class I: BMI 31.8 -Weight loss  Body mass index is 31.87 kg/m.         Discharge Diagnoses:  Active Problems:   Cholangitis      Consultations: Gastroenterology General surgery  Subjective: Feeling okay no complaints wanting to go home.  Discharge Exam: Vitals:   07/17/21 1155 07/17/21 1218  BP: 115/76 110/74  Pulse: 82 81  Resp: 10 18  Temp: 98.1 F (36.7 C) 98.2 F (36.8 C)  SpO2: 97% 96%   Vitals:   07/17/21 1125 07/17/21 1140 07/17/21 1155 07/17/21 1218  BP: 115/84 115/78 115/76 110/74  Pulse: 84 80 82 81  Resp: 13 11 10 18   Temp: 98.1 F (36.7 C)  98.1 F (36.7 C) 98.2 F (36.8 C)  TempSrc:    Oral  SpO2: 97% 96% 97% 96%  Weight:      Height:        General: Pt is alert, awake, not in acute distress Cardiovascular: RRR, S1/S2 +, no rubs, no gallops Respiratory: CTA bilaterally, no wheezing, no rhonchi Abdominal: Soft, NT, ND, bowel sounds + Extremities: no edema, no cyanosis Surgical scars look okay  Discharge Instructions   Allergies as of 07/17/2021  Reactions   Benadryl Allergy [diphenhydramine Hcl] Rash   Flexeril [cyclobenzaprine]    Causes "breakout"        Medication List     TAKE these medications    canagliflozin 300 MG Tabs tablet Commonly known as: INVOKANA Take 300 mg by mouth daily before breakfast.   dicyclomine 10  MG capsule Commonly known as: BENTYL Take 10 mg by mouth 3 (three) times daily as needed for spasms.   escitalopram 10 MG tablet Commonly known as: LEXAPRO TAKE ONE (1) TABLET EACH DAY What changed: See the new instructions.   fenofibrate 145 MG tablet Commonly known as: TRICOR Take 145 mg by mouth daily.   lisinopril 5 MG tablet Commonly known as: ZESTRIL TAKE ONE (1) TABLET EACH DAY What changed: See the new instructions.   omeprazole 20 MG capsule Commonly known as: PRILOSEC Take 1 capsule (20 mg total) by mouth daily.   ondansetron 8 MG disintegrating tablet Commonly known as: ZOFRAN-ODT Take 8 mg by mouth every 8 (eight) hours as needed for nausea.   oxyCODONE 5 MG immediate release tablet Commonly known as: Oxy IR/ROXICODONE Take 1 tablet (5 mg total) by mouth every 6 (six) hours as needed for moderate pain or severe pain (not relieved by tylenol or ibuprofen.).   OZEMPIC (1 MG/DOSE) [place] Inject 1 mg into the skin once a week.   rosuvastatin 5 MG tablet Commonly known as: CRESTOR Take 5 mg by mouth daily.   senna 8.6 MG Tabs tablet Commonly known as: SENOKOT Take 2 tablets (17.2 mg total) by mouth at bedtime as needed for mild constipation or moderate constipation.   Toujeo SoloStar 300 UNIT/ML Solostar Pen Generic drug: insulin glargine (1 Unit Dial) Inject 54 Units into the skin daily. What changed: how much to take        [facility] Go on 08/08/2021.   Specialty: General Surgery Why: Your appointment is 11/17 at 2:00 Please arrive 30 minutes prior to your appointment to check in and fill out paperwork. Bring photo ID and insurance information. Contact information: [address] [place] [phone]        Billie Ruddy, MD Follow up in 1 week(s).   Specialty: Family Medicine Contact information: [place]  [address] [phone]                Allergies  Allergen Reactions   Benadryl Allergy [Diphenhydramine Hcl] Rash   Flexeril [Cyclobenzaprine]     Causes "breakout"    You were cared for by a hospitalist during your hospital stay. If you have any questions about your discharge medications or the care you received while you were in the hospital after you are discharged, you can call the unit and asked to speak with the hospitalist on call if the hospitalist that took care of you is not available. Once you are discharged, your primary care physician will handle any further medical issues. Please note that no refills for any discharge medications will be authorized once you are discharged, as it is imperative that you return to your primary care physician (or establish a relationship with a primary care physician if you do not have one) for your aftercare needs so that they can reassess your need for medications and monitor your lab values.   Procedures/Studies: CT ABDOMEN PELVIS W CONTRAST  Result Date: 07/14/2021 CLINICAL DATA:  Abdominal pain, acute, nonlocalized suspect choledocholithiasis  EXAM: CT ABDOMEN AND PELVIS WITH CONTRAST TECHNIQUE: Multidetector CT imaging of the abdomen and pelvis was performed using the standard protocol following bolus administration of intravenous contrast. CONTRAST:  116mL OMNIPAQUE IOHEXOL 350 MG/ML SOLN COMPARISON:  August 19, 2017 FINDINGS: Lower chest: No acute abnormality. Hepatobiliary: Revisualization of intrahepatic biliary ductal dilation of predominately hepatic segment 6. This is similar in comparison to prior MRI. Unchanged appearance of the common bile duct. Gallbladder is completely decompressed. Cholelithiasis. There is adjacent haziness of the fat along the gallbladder. Hepatic steatosis. Pancreas: Unremarkable. No pancreatic ductal dilatation or surrounding inflammatory changes. Spleen: Unchanged mild splenomegaly. Adrenals/Urinary Tract:  Adrenal glands are unremarkable. No hydronephrosis. Parapelvic cysts. Kidneys enhance symmetrically. No obstructing nephrolithiasis. Subcentimeter hypodense mass of the LEFT kidney is too small to accurately characterize. Bladder is unremarkable. Stomach/Bowel: No evidence of bowel obstruction. Appendix is normal. Moderate colonic stool burden diffusely throughout the colon. Misty mesentery, nonspecific. Vascular/Lymphatic: No significant vascular findings are present. No enlarged retroperitoneal lymph nodes by size criteria. There are scattered mesenteric lymph nodes which are mildly prominent with representative prominent mesenteric lymph node measuring 8 mm in the short axis (series 3, image 38). Reproductive: Prostate is present. Other: No free air or free fluid. Musculoskeletal: No acute or significant osseous findings. IMPRESSION: 1. The gallbladder is decompressed around a cholelithiasis. However, there is haziness of the adjacent fat with subjective wall thickening of the gallbladder. Findings could reflect acute on chronic cholecystitis. No choledocholithiasis visualized. 2. Similar appearance of intrahepatic biliary ductal dilation of predominately hepatic segment 6. 3. Hepatic steatosis. 4. Misty mesentery, nonspecific Electronically Signed   By: Valentino Saxon M.D.   On: 07/14/2021 12:46   MR ABDOMEN MRCP WO CONTRAST  Result Date: 07/14/2021 CLINICAL DATA:  Jaundice. EXAM: MRI ABDOMEN WITHOUT CONTRAST  (INCLUDING MRCP) TECHNIQUE: Multiplanar multisequence MR imaging of the abdomen was performed. Heavily T2-weighted images of the biliary and pancreatic ducts were obtained, and three-dimensional MRCP images were rendered by post processing. COMPARISON:  Abdominopelvic CT same date. Ultrasound 07/11/2021 and MRI 08/19/2017. FINDINGS: Despite efforts by the technologist and patient, mild motion artifact is present on today's exam and could not be eliminated. This reduces exam sensitivity and  specificity. Lower chest:  The visualized lower chest appears unremarkable. Hepatobiliary: No morphologic changes of cirrhosis or significant hepatic steatosis. The gallbladder is contracted with mild wall thickening. Small calcified gallstone seen on CT is not well visualized. Again demonstrated is chronic volume loss and intrahepatic biliary dilatation medially in the posterior right lobe of the liver, predominately segment 6. This is unchanged from the previous MRI. There is no other intrahepatic biliary dilatation. There is no extrahepatic biliary dilatation, however, there are small somewhat linear filling defects in the mid to distal common bile duct, similar to those seen on previous MRCP. These are not calcified on today's CT. Pancreas: Unremarkable. No pancreatic ductal dilatation or surrounding inflammatory changes. Spleen: Stable mild splenomegaly without focal abnormality. Adrenals/Urinary Tract: Both adrenal glands appear normal. Renal sinus cysts are noted bilaterally without hydronephrosis or focal renal abnormality. Stomach/Bowel: The visualized bowel appears normal, without distension, wall thickening or surrounding inflammation. Vascular/Lymphatic: There are no enlarged abdominal lymph nodes. No significant vascular findings. Other: No evidence of abdominal wall hernia or ascites. Musculoskeletal: No acute or significant osseous findings. IMPRESSION: 1. Overall findings are similar to those demonstrated on previous MRI from 08/19/2017. There are linear filling defects in the common bile duct without associated extrahepatic biliary dilatation. While these could reflect blood clots, consider  biliary ascariasis. Recommend ERCP given the patient's jaundice. 2. Stable chronic intrahepatic biliary dilatation and volume loss posteriorly in the right hepatic lobe, likely benign based on stability from previous MRI's dating back to 07/12/2014. 3. Contracted gallbladder with mild nonspecific wall thickening,  not impressive on recent ultrasound. 4. Bilateral renal sinus cysts. Electronically Signed   By: Richardean Sale M.D.   On: 07/14/2021 18:02   DG ERCP  Result Date: 07/16/2021 CLINICAL DATA:  ERCP for jaundice EXAM: ERCP TECHNIQUE: Multiple spot images obtained with the fluoroscopic device and submitted for interpretation post-procedure. FLUOROSCOPY TIME:  78 seconds (52.5 mGy) COMPARISON:  MRCP-07/14/2021; CT abdomen pelvis-07/14/2021 FINDINGS: Two spot intraoperative fluoroscopic images the right upper abdominal quadrant during ERCP provided for review. Initial image demonstrates an ERCP probe overlying the right upper abdominal quadrant. There is selective cannulation and opacification of the CBD which appears nondilated. Subsequent images demonstrate insufflation of a balloon within the distal aspect of the CBD with subsequent presumed biliary sweeping and sphincterotomy. IMPRESSION: ERCP with presumed biliary sweeping and sphincterotomy. These images were submitted for radiologic interpretation only. Please see the procedural report for the amount of contrast and the fluoroscopy time utilized. Electronically Signed   By: Sandi Mariscal M.D.   On: 07/16/2021 09:45   US Abdomen Limited RUQ (LIVER/GB)  Result Date: 07/12/2021 CLINICAL DATA:  Recent jaundice with right upper quadrant pain, initial encounter EXAM: ULTRASOUND ABDOMEN LIMITED RIGHT UPPER QUADRANT COMPARISON:  08/19/2017 MRI FINDINGS: Gallbladder: Gallbladder is not well visualized. Some increased echogenicity is noted in the expected region of the gallbladder which may represent small stones in a decompressed gallbladder. Common bile duct: Diameter: 6 mm. This is within normal limits for the patient's given age. Liver: Increased echogenicity is noted consistent with fatty infiltration. Mild fatty sparing is noted adjacent to the gallbladder fossa. No other focal abnormality is noted. Portal vein is patent on color Doppler imaging with normal  direction of blood flow towards the liver. Other: None. IMPRESSION: Fatty liver. Question decompressed gallbladder with small stones within. No stones were noted on the prior MRI. HIDA scan may be helpful to assess gallbladder function. Electronically Signed   By: Inez Catalina M.D.   On: 07/12/2021 03:44     The results of significant diagnostics from this hospitalization (including imaging, microbiology, ancillary and laboratory) are listed below for reference.     Microbiology: Recent Results (from the past 240 hour(s))  Resp Panel by RT-PCR (Flu A&B, Covid) Nasopharyngeal Swab     Status: None   Collection Time: 07/14/21 10:30 PM   Specimen: Nasopharyngeal Swab; Nasopharyngeal(NP) swabs in vial transport medium  Result Value Ref Range Status   SARS Coronavirus 2 by RT PCR NEGATIVE NEGATIVE Final    Comment: (NOTE) SARS-CoV-2 target nucleic acids are NOT DETECTED.  The SARS-CoV-2 RNA is generally detectable in upper respiratory specimens during the acute phase of infection. The lowest concentration of SARS-CoV-2 viral copies this assay can detect is 138 copies/mL. A negative result does not preclude SARS-Cov-2 infection and should not be used as the sole basis for treatment or other patient management decisions. A negative result may occur with  improper specimen collection/handling, submission of specimen other than nasopharyngeal swab, presence of viral mutation(s) within the areas targeted by this assay, and inadequate number of viral copies(<138 copies/mL). A negative result must be combined with clinical observations, patient history, and epidemiological information. The expected result is Negative.  Fact Sheet for Patients:  EntrepreneurPulse.com.au  Fact Sheet for Healthcare  Providers:  IncredibleEmployment.be  This test is no t yet approved or cleared by the [place] and  has been authorized for detection and/or diagnosis of  SARS-CoV-2 by FDA under an Emergency Use Authorization (EUA). This EUA will remain  in effect (meaning this test can be used) for the duration of the COVID-19 declaration under Section 564(b)(1) of the Act, 21 U.S.C.section 360bbb-3(b)(1), unless the authorization is terminated  or revoked sooner.       Influenza A by PCR NEGATIVE NEGATIVE Final   Influenza B by PCR NEGATIVE NEGATIVE Final    Comment: (NOTE) The Xpert Xpress SARS-CoV-2/FLU/RSV plus assay is intended as an aid in the diagnosis of influenza from Nasopharyngeal swab specimens and should not be used as a sole basis for treatment. Nasal washings and aspirates are unacceptable for Xpert Xpress SARS-CoV-2/FLU/RSV testing.  Fact Sheet for Patients: EntrepreneurPulse.com.au  Fact Sheet for Healthcare Providers: IncredibleEmployment.be  This test is not yet approved or cleared by the [place] FDA and has been authorized for detection and/or diagnosis of SARS-CoV-2 by FDA under an Emergency Use Authorization (EUA). This EUA will remain in effect (meaning this test can be used) for the duration of the COVID-19 declaration under Section 564(b)(1) of the Act, 21 U.S.C. section 360bbb-3(b)(1), unless the authorization is terminated or revoked.  Performed at [facility], [place] [address] [place], [place] [address]   Surgical pcr screen     Status: Abnormal   Collection Time: 07/16/21 11:35 PM   Specimen: Nasal Mucosa; Nasal Swab  Result Value Ref Range Status   MRSA, PCR NEGATIVE NEGATIVE Final   Staphylococcus aureus POSITIVE (A) NEGATIVE Final    Comment: (NOTE) The Xpert SA Assay (FDA approved for NASAL specimens in patients 9 years of age and older), is one component of a comprehensive surveillance program. It is not intended to diagnose infection nor to guide or monitor treatment. Performed at [facility], [place] [address] [place], [place] [address]       Labs: BNP (last 3 results) No results for input(s): BNP in the last 8760 hours. Basic Metabolic Panel: Recent Labs  Lab 07/14/21 0735 07/15/21 0451 07/16/21 0445 07/17/21 0250  NA 134* 134* 134* 136  K 4.4 3.9 3.7 3.8  CL 99 102 100 102  CO2 25 18* 24 25  GLUCOSE 186* 71 97 107*  BUN 12 13 12 11   CREATININE 1.16 1.08 0.94 0.87  CALCIUM 9.2 8.3* 8.5* 8.6*   Liver Function Tests: Recent Labs  Lab 07/14/21 0735 07/15/21 0451 07/16/21 0445 07/17/21 0250  AST 243* 219* 82* 36  ALT 188* 257* 173* 115*  ALKPHOS 135* 111 121 108  BILITOT 4.4* 6.3* 2.4* 1.8*  PROT 7.1 6.0* 5.8* 5.6*  ALBUMIN 4.0 3.0* 2.8* 2.8*   Recent Labs  Lab 07/14/21 0735 07/16/21 0445 07/17/21 0250  LIPASE 102* 29 32   No results for input(s): AMMONIA in the last 168 hours. CBC: Recent Labs  Lab 07/14/21 0735 07/15/21 0451 07/16/21 0445 07/17/21 0250  WBC 14.6* 6.8 5.9 4.5  NEUTROABS  --  5.2  --   --   HGB 15.1 12.7* 12.6* 12.2*  HCT 47.9 39.9 39.1 37.9*  MCV 86.9 87.3 85.0 85.4  PLT 205 128* 137* 132*   Cardiac Enzymes: No results for input(s): CKTOTAL, CKMB, CKMBINDEX, TROPONINI in the last 168 hours. BNP: Invalid input(s): POCBNP CBG: Recent Labs  Lab 07/16/21 2331 07/17/21 0418 07/17/21 0827 07/17/21 1125 07/17/21 1222  GLUCAP 137* 105* 109* 131* 121*   D-Dimer No results for input(s): DDIMER in the last 72 hours. Hgb A1c No results for input(s): HGBA1C in the last 72 hours. Lipid Profile No results for input(s): CHOL, HDL, LDLCALC, TRIG, CHOLHDL, LDLDIRECT in the last 72 hours. Thyroid function studies No results for input(s): TSH, T4TOTAL, T3FREE, THYROIDAB in the last 72 hours.  Invalid input(s): FREET3 Anemia work up No results for input(s): VITAMINB12, FOLATE, FERRITIN, TIBC, IRON, RETICCTPCT in the last 72 hours. Urinalysis    Component Value Date/Time   COLORURINE YELLOW 07/14/2021 0735   APPEARANCEUR HAZY (A) 07/14/2021 0735   LABSPEC 1.032 (H)  07/14/2021 0735   PHURINE 5.0 07/14/2021 0735   GLUCOSEU >=500 (A) 07/14/2021 0735   HGBUR NEGATIVE 07/14/2021 0735   BILIRUBINUR NEGATIVE 07/14/2021 0735   KETONESUR 20 (A) 07/14/2021 0735   PROTEINUR NEGATIVE 07/14/2021 0735   NITRITE NEGATIVE 07/14/2021 0735   LEUKOCYTESUR NEGATIVE 07/14/2021 0735   Sepsis Labs Invalid input(s): PROCALCITONIN,  WBC,  LACTICIDVEN Microbiology Recent Results (from the past 240 hour(s))  Resp Panel by RT-PCR (Flu A&B, Covid) Nasopharyngeal Swab     Status: None   Collection Time: 07/14/21 10:30 PM   Specimen: Nasopharyngeal Swab; Nasopharyngeal(NP) swabs in vial transport medium  Result Value Ref Range Status   SARS Coronavirus 2 by RT PCR NEGATIVE NEGATIVE Final    Comment: (NOTE) SARS-CoV-2 target nucleic acids are NOT DETECTED.  The SARS-CoV-2 RNA is generally detectable in upper respiratory specimens during the acute phase of infection. The lowest concentration of SARS-CoV-2 viral copies this assay can detect is 138 copies/mL. A negative result does not preclude SARS-Cov-2 infection and should not be used as the sole basis for treatment or other patient management decisions. A negative result may occur with  improper specimen collection/handling, submission of specimen other than nasopharyngeal swab, presence of viral mutation(s) within the areas targeted by this assay, and inadequate number of viral copies(<138 copies/mL). A negative result must be combined with clinical observations, patient history, and epidemiological information. The expected result is Negative.  Fact Sheet for Patients:  EntrepreneurPulse.com.au  Fact Sheet for Healthcare Providers:  IncredibleEmployment.be  This test is no t yet approved or cleared by the [place] FDA and  has been authorized for detection and/or diagnosis of SARS-CoV-2 by FDA under an Emergency Use Authorization (EUA). This EUA will remain  in effect  (meaning this test can be used) for the duration of the COVID-19 declaration under Section 564(b)(1) of the Act, 21 U.S.C.section 360bbb-3(b)(1), unless the authorization is terminated  or revoked sooner.       Influenza A by PCR NEGATIVE NEGATIVE Final   Influenza B by PCR NEGATIVE NEGATIVE Final    Comment: (NOTE) The Xpert Xpress SARS-CoV-2/FLU/RSV plus assay is intended as an aid in the diagnosis of influenza from Nasopharyngeal swab specimens and should not be used as a sole basis for treatment. Nasal washings and aspirates are unacceptable for Xpert Xpress SARS-CoV-2/FLU/RSV testing.  Fact Sheet for Patients: EntrepreneurPulse.com.au  Fact Sheet for Healthcare Providers: IncredibleEmployment.be  This test is not yet approved or cleared by the [place] FDA and has been authorized for detection and/or diagnosis of SARS-CoV-2 by FDA under an Emergency Use Authorization (EUA). This EUA will remain in effect (meaning this test can be used) for the duration of the COVID-19 declaration under Section 564(b)(1) of the Act, 21 U.S.C. section 360bbb-3(b)(1), unless the authorization is terminated or revoked.  Performed at [facility],  [address], [place], [place] [address]   Surgical pcr screen     Status: Abnormal   Collection Time: 07/16/21 11:35 PM   Specimen: Nasal Mucosa; Nasal Swab  Result Value Ref Range Status   MRSA, PCR NEGATIVE NEGATIVE Final   Staphylococcus aureus POSITIVE (A) NEGATIVE Final    Comment: (NOTE) The Xpert SA Assay (FDA approved for NASAL specimens in patients 28 years of age and older), is one component of a comprehensive surveillance program. It is not intended to diagnose infection nor to guide or monitor treatment. Performed at [facility], [place] [address] [place],  [address]      Time coordinating discharge:  I have spent 35 minutes face to face with the patient and on the  ward discussing the patients care, assessment, plan and disposition with other care givers. >50% of the time was devoted counseling the patient about the risks and benefits of treatment/Discharge disposition and coordinating care.   SIGNED:   Damita Lack, MD  [facility] 07/17/2021, 3:41 PM   If 7PM-7AM, please contact night-coverage

## 2021-07-17 NOTE — TOC Transition Note (Signed)
Transition of Care [facility]) - CM/SW Discharge Note   Patient Details  Name: Omar Travis MRN: [id number] Date of Birth: 1965-10-18  Transition of Care [facility]) CM/SW Contact:  Tom-Johnson, Renea Ee, RN Phone Number: 07/17/2021, 4:06 PM   Clinical Narrative:    Patient is scheduled for discharge. Had LAPAROSCOPIC CHOLECYSTECTOMY Laparoscopic Cholecystectomy done today. CM checked with patient if there is any TOC needs. Patient denies any needs. Wife at bedside and will transport at discharge. No recommendations noted. No further TOC needs noted.    Final next level of care: Home/Self Care Barriers to Discharge: No Barriers Identified   Patient Goals and CMS Choice Patient states their goals for this hospitalization and ongoing recovery are:: To go home with wife. CMS Medicare.gov Compare Post Acute Care list provided to:: Patient Choice offered to / list presented to : NA  Discharge Placement                       Discharge Plan and Services   Discharge Planning Services: CM Consult            DME Arranged: N/A DME Agency: NA       HH Arranged: NA HH Agency: NA        Social Determinants of Health (SDOH) Interventions     Readmission Risk Interventions No flowsheet data found.

## 2021-07-17 NOTE — Progress Notes (Signed)
PROGRESS NOTE    Omar Travis  GXQ:[id number] DOB: [birth year] DOA: 07/14/2021 PCP: Billie Ruddy, MD   Brief Narrative:  55 y.o. male with PMH of chronic intrahepatic bile duct dilation followed by GI at [facility], insulin-dependent diabetes, hypertension, hyperlipidemia came with worsening right upper quad abdominal pain, jaundice, symptoms started on Monday with acute onset right upper quadrant pain seen by PCP and had LFTs done that were abnormal right upper quadrant ultrasound was done that showed a small stones with in gallbladder, outpatient HIDA scan was also ordered but not done he developed severe right upper quadrant pain on the day of admission and seen in the ED   In the ED febrile 101.2, abnormal LFTs CT abdomen pelvis showing acute and chronic cholecystitis, surgery and GI consulted and patient admitted. S/P MRCP concerning for choledocholithiasis.   Assessment & Plan:   Active Problems:   Cholangitis  Acute calculus cholecystitis status post laparoscopic cholecystectomy Acute cholangitis Obstructive jaundice Pancreatitis with lipase 102 Choledocholithiasis: -LFTs improving status post ERCP -removed with sphincterotomy x2, balloon extraction, pancreatic stent placed.  Also underwent laparoscopic cholecystectomy on 10/26 which he tolerated well.  Hematic.,  Pain control.  Diet as tolerated   Mild hyponatremia-sodium stable Metabolic acidosis: Resolved   Essential hypertension: BP is controlled holding home meds for now   Diabetes on long-term insulin, uncontrolled due to hyperglycemia: HbA1c 9.2 poorly controlled on outpatient.  Follow-up outpatient PCP.  Sliding scale and Accu-Cheks  HLD-statin on hold   Hepatic steatosis Obesity class I: BMI 31.8 -Weight loss  DVT prophylaxis: ambulatory Code Status: Full Family Communication:  wife at bedside  Status is: Inpatient  Remains inpatient appropriate because: Status post surgery today, probably monitor for  overall tolerance and make sure he passes gas.  Hopefully discharge tomorrow       Subjective: Seen postoperatively in his room, felt bloated and had abdominal tenderness around the surgical site.  Review of Systems Otherwise negative except as per HPI, including: General: Denies fever, chills, night sweats or unintended weight loss. Resp: Denies cough, wheezing, shortness of breath. Cardiac: Denies chest pain, palpitations, orthopnea, paroxysmal nocturnal dyspnea. GI: Denies abdominal pain, nausea, vomiting, diarrhea or constipation GU: Denies dysuria, frequency, hesitancy or incontinence MS: Denies muscle aches, joint pain or swelling Neuro: Denies headache, neurologic deficits (focal weakness, numbness, tingling), abnormal gait Psych: Denies anxiety, depression, SI/HI/AVH Skin: Denies new rashes or lesions ID: Denies sick contacts, exotic exposures, travel  Examination:  General exam: Appears calm and comfortable  Respiratory system: Clear to auscultation. Respiratory effort normal. Cardiovascular system: S1 & S2 heard, RRR. No JVD, murmurs, rubs, gallops or clicks. No pedal edema. Gastrointestinal system: Abdomen is nondistended, soft and nontender. No organomegaly or masses felt. Normal bowel sounds heard. Central nervous system: Alert and oriented. No focal neurological deficits. Extremities: Symmetric 5 x 5 power. Skin: No rashes, lesions or ulcers.  Surgical site noted without any evidence of active infection or bleeding Psychiatry: Judgement and insight appear normal. Mood & affect appropriate.     Objective: Vitals:   07/17/21 1125 07/17/21 1140 07/17/21 1155 07/17/21 1218  BP: 115/84 115/78 115/76 110/74  Pulse: 84 80 82 81  Resp: 13 11 10 18   Temp: 98.1 F (36.7 C)  98.1 F (36.7 C) 98.2 F (36.8 C)  TempSrc:    Oral  SpO2: 97% 96% 97% 96%  Weight:      Height:        Intake/Output Summary (Last 24 hours) at 07/17/2021 1332  Last data filed at  07/17/2021 1105 Gross per 24 hour  Intake 1580.01 ml  Output 25 ml  Net 1555.01 ml   Filed Weights   07/15/21 2111 07/16/21 0700  Weight: 107.6 kg 106.6 kg     Data Reviewed:   CBC: Recent Labs  Lab 07/14/21 0735 07/15/21 0451 07/16/21 0445 07/17/21 0250  WBC 14.6* 6.8 5.9 4.5  NEUTROABS  --  5.2  --   --   HGB 15.1 12.7* 12.6* 12.2*  HCT 47.9 39.9 39.1 37.9*  MCV 86.9 87.3 85.0 85.4  PLT 205 128* 137* 427*   Basic Metabolic Panel: Recent Labs  Lab 07/14/21 0735 07/15/21 0451 07/16/21 0445 07/17/21 0250  NA 134* 134* 134* 136  K 4.4 3.9 3.7 3.8  CL 99 102 100 102  CO2 25 18* 24 25  GLUCOSE 186* 71 97 107*  BUN 12 13 12 11   CREATININE 1.16 1.08 0.94 0.87  CALCIUM 9.2 8.3* 8.5* 8.6*   GFR: Estimated Creatinine Clearance: 125.3 mL/min (by C-G formula based on SCr of 0.87 mg/dL). Liver Function Tests: Recent Labs  Lab 07/14/21 0735 07/15/21 0451 07/16/21 0445 07/17/21 0250  AST 243* 219* 82* 36  ALT 188* 257* 173* 115*  ALKPHOS 135* 111 121 108  BILITOT 4.4* 6.3* 2.4* 1.8*  PROT 7.1 6.0* 5.8* 5.6*  ALBUMIN 4.0 3.0* 2.8* 2.8*   Recent Labs  Lab 07/14/21 0735 07/16/21 0445 07/17/21 0250  LIPASE 102* 29 32   No results for input(s): AMMONIA in the last 168 hours. Coagulation Profile: Recent Labs  Lab 07/15/21 0451  INR 1.2   Cardiac Enzymes: No results for input(s): CKTOTAL, CKMB, CKMBINDEX, TROPONINI in the last 168 hours. BNP (last 3 results) No results for input(s): PROBNP in the last 8760 hours. HbA1C: No results for input(s): HGBA1C in the last 72 hours. CBG: Recent Labs  Lab 07/16/21 2331 07/17/21 0418 07/17/21 0827 07/17/21 1125 07/17/21 1222  GLUCAP 137* 105* 109* 131* 121*   Lipid Profile: No results for input(s): CHOL, HDL, LDLCALC, TRIG, CHOLHDL, LDLDIRECT in the last 72 hours. Thyroid Function Tests: No results for input(s): TSH, T4TOTAL, FREET4, T3FREE, THYROIDAB in the last 72 hours. Anemia Panel: No results for  input(s): VITAMINB12, FOLATE, FERRITIN, TIBC, IRON, RETICCTPCT in the last 72 hours. Sepsis Labs: Recent Labs  Lab 07/14/21 0623  LATICACIDVEN 1.0    Recent Results (from the past 240 hour(s))  Resp Panel by RT-PCR (Flu A&B, Covid) Nasopharyngeal Swab     Status: None   Collection Time: 07/14/21 10:30 PM   Specimen: Nasopharyngeal Swab; Nasopharyngeal(NP) swabs in vial transport medium  Result Value Ref Range Status   SARS Coronavirus 2 by RT PCR NEGATIVE NEGATIVE Final    Comment: (NOTE) SARS-CoV-2 target nucleic acids are NOT DETECTED.  The SARS-CoV-2 RNA is generally detectable in upper respiratory specimens during the acute phase of infection. The lowest concentration of SARS-CoV-2 viral copies this assay can detect is 138 copies/mL. A negative result does not preclude SARS-Cov-2 infection and should not be used as the sole basis for treatment or other patient management decisions. A negative result may occur with  improper specimen collection/handling, submission of specimen other than nasopharyngeal swab, presence of viral mutation(s) within the areas targeted by this assay, and inadequate number of viral copies(<138 copies/mL). A negative result must be combined with clinical observations, patient history, and epidemiological information. The expected result is Negative.  Fact Sheet for Patients:  EntrepreneurPulse.com.au  Fact Sheet for Healthcare Providers:  IncredibleEmployment.be  This test is no t yet approved or cleared by the [place] and  has been authorized for detection and/or diagnosis of SARS-CoV-2 by FDA under an Emergency Use Authorization (EUA). This EUA will remain  in effect (meaning this test can be used) for the duration of the COVID-19 declaration under Section 564(b)(1) of the Act, 21 U.S.C.section 360bbb-3(b)(1), unless the authorization is terminated  or revoked sooner.       Influenza A by PCR  NEGATIVE NEGATIVE Final   Influenza B by PCR NEGATIVE NEGATIVE Final    Comment: (NOTE) The Xpert Xpress SARS-CoV-2/FLU/RSV plus assay is intended as an aid in the diagnosis of influenza from Nasopharyngeal swab specimens and should not be used as a sole basis for treatment. Nasal washings and aspirates are unacceptable for Xpert Xpress SARS-CoV-2/FLU/RSV testing.  Fact Sheet for Patients: EntrepreneurPulse.com.au  Fact Sheet for Healthcare Providers: IncredibleEmployment.be  This test is not yet approved or cleared by the [place] FDA and has been authorized for detection and/or diagnosis of SARS-CoV-2 by FDA under an Emergency Use Authorization (EUA). This EUA will remain in effect (meaning this test can be used) for the duration of the COVID-19 declaration under Section 564(b)(1) of the Act, 21 U.S.C. section 360bbb-3(b)(1), unless the authorization is terminated or revoked.  Performed at [facility], [place] [address] [place], [place] [address]   Surgical pcr screen     Status: Abnormal   Collection Time: 07/16/21 11:35 PM   Specimen: Nasal Mucosa; Nasal Swab  Result Value Ref Range Status   MRSA, PCR NEGATIVE NEGATIVE Final   Staphylococcus aureus POSITIVE (A) NEGATIVE Final    Comment: (NOTE) The Xpert SA Assay (FDA approved for NASAL specimens in patients 31 years of age and older), is one component of a comprehensive surveillance program. It is not intended to diagnose infection nor to guide or monitor treatment. Performed at [facility], [place] [address] [place], [place] [address]          Radiology Studies: DG ERCP  Result Date: 07/16/2021 CLINICAL DATA:  ERCP for jaundice EXAM: ERCP TECHNIQUE: Multiple spot images obtained with the fluoroscopic device and submitted for interpretation post-procedure. FLUOROSCOPY TIME:  78 seconds (52.5 mGy) COMPARISON:  MRCP-07/14/2021; CT abdomen pelvis-07/14/2021  FINDINGS: Two spot intraoperative fluoroscopic images the right upper abdominal quadrant during ERCP provided for review. Initial image demonstrates an ERCP probe overlying the right upper abdominal quadrant. There is selective cannulation and opacification of the CBD which appears nondilated. Subsequent images demonstrate insufflation of a balloon within the distal aspect of the CBD with subsequent presumed biliary sweeping and sphincterotomy. IMPRESSION: ERCP with presumed biliary sweeping and sphincterotomy. These images were submitted for radiologic interpretation only. Please see the procedural report for the amount of contrast and the fluoroscopy time utilized. Electronically Signed   By: Sandi Mariscal M.D.   On: 07/16/2021 09:45        Scheduled Meds:  Chlorhexidine Gluconate Cloth  6 each Topical Daily   escitalopram  10 mg Oral Q1500   gabapentin  300 mg Oral On Call to OR   insulin aspart  0-9 Units Subcutaneous Q4H   mupirocin ointment  1 application Nasal BID   pantoprazole  40 mg Oral Daily   Continuous Infusions:   LOS: 3 days   Time spent= 35 mins    Aryiana Klinkner Arsenio Loader, MD [facility]  If 7PM-7AM, please contact night-coverage  07/17/2021, 1:32 PM

## 2021-07-17 NOTE — Progress Notes (Signed)
Patient ID: LASON EVELAND, male   DOB: 02-22-1966, 55 y.o.   MRN: [id number] [facility] Surgery Progress Note  Day of Surgery  Subjective: CC-  Feeling well this morning.  No nausea or abdominal pain.  Objective: Vital signs in last 24 hours: Temp:  [97.9 F (36.6 C)-98.4 F (36.9 C)] 97.9 F (36.6 C) (10/26 0828) Pulse Rate:  [82-87] 82 (10/26 0828) Resp:  [17-18] 18 (10/26 0828) BP: (110-124)/(74-87) 110/80 (10/26 0828) SpO2:  [95 %-99 %] 95 % (10/26 0828) Last BM Date: 07/14/21  Intake/Output from previous day: 10/25 0701 - 10/26 0700 In: 2097.5 [P.O.:1380; I.V.:559.2; IV Piggyback:158.3] Out: 0  Intake/Output this shift: No intake/output data recorded.  PE: Gen:  Alert, NAD, pleasant HEENT: EOM's intact, pupils equal and round Pulm:  rate and effort normal Abd: Soft, ND, abdomen nontender Psych: A&Ox4  Skin: warm and dry  Lab Results:  Recent Labs    07/16/21 0445 07/17/21 0250  WBC 5.9 4.5  HGB 12.6* 12.2*  HCT 39.1 37.9*  PLT 137* 132*    BMET Recent Labs    07/16/21 0445 07/17/21 0250  NA 134* 136  K 3.7 3.8  CL 100 102  CO2 24 25  GLUCOSE 97 107*  BUN 12 11  CREATININE 0.94 0.87  CALCIUM 8.5* 8.6*    PT/INR Recent Labs    07/15/21 0451  LABPROT 14.7  INR 1.2    CMP     Component Value Date/Time   NA 136 07/17/2021 0250   K 3.8 07/17/2021 0250   CL 102 07/17/2021 0250   CO2 25 07/17/2021 0250   GLUCOSE 107 (H) 07/17/2021 0250   BUN 11 07/17/2021 0250   CREATININE 0.87 07/17/2021 0250   CALCIUM 8.6 (L) 07/17/2021 0250   PROT 5.6 (L) 07/17/2021 0250   ALBUMIN 2.8 (L) 07/17/2021 0250   AST 36 07/17/2021 0250   ALT 115 (H) 07/17/2021 0250   ALKPHOS 108 07/17/2021 0250   BILITOT 1.8 (H) 07/17/2021 0250   GFRNONAA >60 07/17/2021 0250   Lipase     Component Value Date/Time   LIPASE 32 07/17/2021 0250       Studies/Results: DG ERCP  Result Date: 07/16/2021 CLINICAL DATA:  ERCP for jaundice EXAM: ERCP  TECHNIQUE: Multiple spot images obtained with the fluoroscopic device and submitted for interpretation post-procedure. FLUOROSCOPY TIME:  78 seconds (52.5 mGy) COMPARISON:  MRCP-07/14/2021; CT abdomen pelvis-07/14/2021 FINDINGS: Two spot intraoperative fluoroscopic images the right upper abdominal quadrant during ERCP provided for review. Initial image demonstrates an ERCP probe overlying the right upper abdominal quadrant. There is selective cannulation and opacification of the CBD which appears nondilated. Subsequent images demonstrate insufflation of a balloon within the distal aspect of the CBD with subsequent presumed biliary sweeping and sphincterotomy. IMPRESSION: ERCP with presumed biliary sweeping and sphincterotomy. These images were submitted for radiologic interpretation only. Please see the procedural report for the amount of contrast and the fluoroscopy time utilized. Electronically Signed   By: Sandi Mariscal M.D.   On: 07/16/2021 09:45    Anti-infectives: Anti-infectives (From admission, onward)    Start     Dose/Rate Route Frequency Ordered Stop   07/14/21 2300  [MAR Hold]  piperacillin-tazobactam (ZOSYN) IVPB 3.375 g        (MAR Hold since Wed 07/17/2021 at 0918.Hold Reason: Transfer to a Procedural area)  See Hyperspace for full Linked Orders Report.   3.375 g 12.5 mL/hr over 240 Minutes Intravenous Every 8 hours 07/14/21 1408  07/14/21 1415  piperacillin-tazobactam (ZOSYN) IVPB 3.375 g       See Hyperspace for full Linked Orders Report.   3.375 g 100 mL/hr over 30 Minutes Intravenous  Once 07/14/21 1408 07/14/21 2032   07/14/21 1130  piperacillin-tazobactam (ZOSYN) IVPB 3.375 g        3.375 g 100 mL/hr over 30 Minutes Intravenous  Once 07/14/21 1119 07/14/21 1301        Assessment/Plan Acute cholecystitis Pancreatitis - lipase 102>>29 Choledocholithiasis - s/p successful ERCP 10/25: Choledocholithiasis was found and complete removal was accomplished by biliary  sphincterotomy, One pancreatic stent was placed into the ventral pancreatic duct - I recommend proceeding with laparoscopic cholecystectomy with possible cholangiogram. Discussed technique of and risks of surgery including bleeding, pain, scarring, intraabdominal injury specifically to the common bile duct and sequelae, bile leak, conversion to open surgery, blood clot, pneumonia, heart attack, stroke, failure to resolve symptoms, etc. Questions welcomed and answered.  We will proceed to the OR this morning    ID - zosyn 10/23 FEN - IVF, NPO for OR VTE - lovenox Foley - none   HTN HLD DM GERD   LOS: 3 days    Clovis Riley, MD [facility] 07/17/2021, 9:39 AM Please see Amion for pager number during day hours 7:00am-4:30pm

## 2021-07-17 NOTE — Anesthesia Preprocedure Evaluation (Addendum)
Anesthesia Evaluation  Patient identified by MRN, date of birth, ID band Patient awake    Reviewed: Allergy & Precautions, NPO status , Patient's Chart, lab work & pertinent test results  History of Anesthesia Complications Negative for: history of anesthetic complications  Airway Mallampati: II  TM Distance: >3 FB Neck ROM: Full    Dental no notable dental hx. (+) Dental Advisory Given   Pulmonary neg pulmonary ROS,    Pulmonary exam normal        Cardiovascular hypertension, Pt. on medications Normal cardiovascular exam Rhythm:Regular Rate:Normal     Neuro/Psych PSYCHIATRIC DISORDERS Depression negative neurological ROS     GI/Hepatic Neg liver ROS, GERD  ,  Endo/Other  diabetes, Type 2, Oral Hypoglycemic Agents  Renal/GU negative Renal ROS     Musculoskeletal negative musculoskeletal ROS (+)   Abdominal (+) + obese,   Peds  Hematology negative hematology ROS (+)   Anesthesia Other Findings   Reproductive/Obstetrics                            Anesthesia Physical  Anesthesia Plan  ASA: 2  Anesthesia Plan: General   Post-op Pain Management:    Induction: Intravenous  PONV Risk Score and Plan: 4 or greater and Ondansetron, Midazolam, Treatment may vary due to age or medical condition, Dexamethasone and Diphenhydramine  Airway Management Planned: Oral ETT  Additional Equipment: None  Intra-op Plan:   Post-operative Plan: Extubation in OR  Informed Consent: I have reviewed the patients History and Physical, chart, labs and discussed the procedure including the risks, benefits and alternatives for the proposed anesthesia with the patient or authorized representative who has indicated his/her understanding and acceptance.     Dental advisory given  Plan Discussed with: CRNA  Anesthesia Plan Comments:        Anesthesia Quick Evaluation

## 2021-07-17 NOTE — Transfer of Care (Signed)
Immediate Anesthesia Transfer of Care Note  Patient: Omar Travis  Procedure(s) Performed: LAPAROSCOPIC CHOLECYSTECTOMY (Abdomen)  Patient Location: PACU  Anesthesia Type:General  Level of Consciousness: awake, alert  and oriented  Airway & Oxygen Therapy: Patient Spontanous Breathing and Patient connected to nasal cannula oxygen  Post-op Assessment: Report given to RN and Post -op Vital signs reviewed and stable  Post vital signs: Reviewed and stable  Last Vitals:  Vitals Value Taken Time  BP 115/84 07/17/21 1123  Temp    Pulse 88 07/17/21 1123  Resp 10 07/17/21 1123  SpO2 97 % 07/17/21 1123  Vitals shown include unvalidated device data.  Last Pain:  Vitals:   07/17/21 0828  TempSrc: Oral  PainSc:       Patients Stated Pain Goal: 0 (61/90/12 2241)  Complications: No notable events documented.

## 2021-07-17 NOTE — Op Note (Signed)
Operative Note  SHALEV HELMINIAK 55 y.o. male [id number]  07/17/2021  Surgeon: Clovis Riley MD FACS  Assistant: Margie Billet PA-C  Procedure performed: Laparoscopic Cholecystectomy  Procedure classification: urgent  Preop diagnosis: Cholecystitis, choledocholithiasis and pancreatitis, status post successful ERCP  Post-op diagnosis/intraop findings: same  Specimens: gallbladder  Retained items: none  EBL: minimal  Complications: none  Description of procedure: After obtaining informed consent the patient was brought to the operating room. Antibiotics were administered. SCD's were applied. General endotracheal anesthesia was initiated and a formal time-out was performed. The abdomen was prepped and draped in the usual sterile fashion and the abdomen was entered using an infraumbilical veress needle after instilling the site with local. Insufflation to 64mmHg was obtained, 35mm trocar and camera inserted, and gross inspection revealed no evidence of injury from our entry or other intraabdominal abnormalities. Two 63mm trocars were introduced in the right midclavicular and right anterior axillary lines under direct visualization and following infiltration with local. An 59mm trocar was placed in the epigastrium. The gallbladder was retracted cephalad and the infundibulum was retracted laterally.  Omental adhesions to the gallbladder were taken down with cautery and blunt dissection.  A combination of hook electrocautery and blunt dissection was utilized to clear the peritoneum from the neck and cystic duct, circumferentially isolating the cystic artery and cystic duct and lifting the gallbladder from the cystic plate.  There was a fair amount of inflammatory change and chronic fat infiltration in this area suggestive of acute on chronic cholecystitis. The critical view of safety was achieved with the cystic artery, cystic duct, and liver bed visualized between them with no other structures.  The artery was clipped with a single clip proximally and distally and divided as was the cystic duct with four clips on the proximal end. The gallbladder was dissected from the liver plate using electrocautery. Once freed the gallbladder was placed in an endocatch bag and removed intact through the epigastric trocar site. A small amount of bleeding on the liver bed was controlled with cautery. The right upper quadrant was irrigated and aspirated, the effluent was clear. Hemostasis was once again confirmed, and reinspection of the abdomen revealed no injuries. The clips were well opposed without any bile leak from the duct or the liver bed. The 4mm trocar site in the epigastrium was closed with a 0 vicryl in the fascia under direct visualization using a PMI device. The abdomen was desufflated and all trocars removed. The skin incisions were closed with subcuticular 4-0 monocryl and Dermabond. The patient was awakened, extubated and transported to the recovery room in stable condition.    All counts were correct at the completion of the case.

## 2021-07-17 NOTE — Progress Notes (Signed)
[facility] Gastroenterology Progress Note  ALTIN SEASE 55 y.o. Aug 25, 1966   Subjective: Saw patient prior to going to surgery. Feels good. Denies abdominal pain. Wife in room.  Objective: Vital signs: Vitals:   07/16/21 2128 07/17/21 0828  BP: 124/87 110/80  Pulse: 86 82  Resp: 18 18  Temp: 98.4 F (36.9 C) 97.9 F (36.6 C)  SpO2: 99% 95%    Physical Exam: Gen: alert, no acute distress, well-nourished HEENT: anicteric sclera CV: RRR Chest: CTA B Abd: soft, nontender, nondistended, +BS Ext: no edema  Lab Results: Recent Labs    07/16/21 0445 07/17/21 0250  NA 134* 136  K 3.7 3.8  CL 100 102  CO2 24 25  GLUCOSE 97 107*  BUN 12 11  CREATININE 0.94 0.87  CALCIUM 8.5* 8.6*   Recent Labs    07/16/21 0445 07/17/21 0250  AST 82* 36  ALT 173* 115*  ALKPHOS 121 108  BILITOT 2.4* 1.8*  PROT 5.8* 5.6*  ALBUMIN 2.8* 2.8*   Recent Labs    07/15/21 0451 07/16/21 0445 07/17/21 0250  WBC 6.8 5.9 4.5  NEUTROABS 5.2  --   --   HGB 12.7* 12.6* 12.2*  HCT 39.9 39.1 37.9*  MCV 87.3 85.0 85.4  PLT 128* 137* 132*      Assessment/Plan: S/P ERCP with sphincterotomy and choledocholithiasis and sludge removal and pancreatic stent placement. LFTs improving. Doing well post-ERCP and going for lap chole now. Will sign off. Call us back if needed.   [name] 07/17/2021, 10:26 AM  Questions please call [phone] Patient ID: SANFORD LINDBLAD, male   DOB: [birth year], 55 y.o.   MRN: [id number]

## 2021-07-17 NOTE — Anesthesia Postprocedure Evaluation (Signed)
Anesthesia Post Note  Patient: Omar Travis  Procedure(s) Performed: LAPAROSCOPIC CHOLECYSTECTOMY (Abdomen)     Patient location during evaluation: PACU Anesthesia Type: General Level of consciousness: sedated and patient cooperative Pain management: pain level controlled Vital Signs Assessment: post-procedure vital signs reviewed and stable Respiratory status: spontaneous breathing Cardiovascular status: stable Anesthetic complications: no   No notable events documented.  Last Vitals:  Vitals:   07/17/21 1155 07/17/21 1218  BP: 115/76 110/74  Pulse: 82 81  Resp: 10 18  Temp: 36.7 C 36.8 C  SpO2: 97% 96%    Last Pain:  Vitals:   07/17/21 1241  TempSrc:   PainSc: [place]

## 2021-07-17 NOTE — Discharge Instructions (Signed)
[facility]  Please arrive at least 30 min before your appointment to complete your check in paperwork.  If you are unable to arrive 30 min prior to your appointment time we may have to cancel or reschedule you. LAPAROSCOPIC SURGERY: POST OP INSTRUCTIONS Always review your discharge instruction sheet given to you by the facility where your surgery was performed. IF YOU HAVE DISABILITY OR FAMILY LEAVE FORMS, YOU MUST BRING THEM TO THE OFFICE FOR PROCESSING.   DO NOT GIVE THEM TO YOUR DOCTOR.  PAIN CONTROL  First take acetaminophen (Tylenol) AND/or ibuprofen (Advil) to control your pain after surgery.  Follow directions on package.  Taking acetaminophen (Tylenol) and/or ibuprofen (Advil) regularly after surgery will help to control your pain and lower the amount of prescription pain medication you may need.  You should not take more than 4,000 mg (4 grams) of acetaminophen (Tylenol) in 24 hours.  You should not take ibuprofen (Advil), aleve, motrin, naprosyn or other NSAIDS if you have a history of stomach ulcers or chronic kidney disease.  A prescription for pain medication may be given to you upon discharge.  Take your pain medication as prescribed, if you still have uncontrolled pain after taking acetaminophen (Tylenol) or ibuprofen (Advil). Use ice packs to help control pain. If you need a refill on your pain medication, please contact your pharmacy.  They will contact our office to request authorization. Prescriptions will not be filled after 5pm or on week-ends.  HOME MEDICATIONS Take your usually prescribed medications unless otherwise directed.  DIET You should follow a light diet the first few days after arrival home.  Be sure to include lots of fluids daily. Avoid fatty, fried foods.   CONSTIPATION It is common to experience some constipation after surgery and if you are taking pain medication.  Increasing fluid intake and taking a stool softener (such as Colace)  will usually help or prevent this problem from occurring.  A mild laxative (Milk of Magnesia or Miralax) should be taken according to package instructions if there are no bowel movements after 48 hours.  WOUND/INCISION CARE Most patients will experience some swelling and bruising in the area of the incisions.  Ice packs will help.  Swelling and bruising can take several days to resolve.  Unless discharge instructions indicate otherwise, follow guidelines below  STERI-STRIPS - you may remove your outer bandages 48 hours after surgery, and you may shower at that time.  You have steri-strips (small skin tapes) in place directly over the incision.  These strips should be left on the skin for 7-10 days.   DERMABOND/SKIN GLUE - you may shower in 24 hours.  The glue will flake off over the next 2-3 weeks. Any sutures or staples will be removed at the office during your follow-up visit.  ACTIVITIES You may resume regular (light) daily activities beginning the next day--such as daily self-care, walking, climbing stairs--gradually increasing activities as tolerated.  You may have sexual intercourse when it is comfortable.  Refrain from any heavy lifting or straining until approved by your doctor. You may drive when you are no longer taking prescription pain medication, you can comfortably wear a seatbelt, and you can safely maneuver your car and apply brakes.  FOLLOW-UP You should see your doctor in the office for a follow-up appointment approximately 2-3 weeks after your surgery.  You should have been given your post-op/follow-up appointment when your surgery was scheduled.  If you did not receive a post-op/follow-up appointment, make sure   that you call for this appointment within a day or two after you arrive home to insure a convenient appointment time.  OTHER INSTRUCTIONS  WHEN TO CALL YOUR DOCTOR: Fever over 101.0 Inability to urinate Continued bleeding from incision. Increased pain, redness, or  drainage from the incision. Increasing abdominal pain  The clinic staff is available to answer your questions during regular business hours.  Please don't hesitate to call and ask to speak to one of the nurses for clinical concerns.  If you have a medical emergency, go to the nearest emergency room or call 911.  A surgeon from [facility] is always on call at the hospital. [address], [address], [place], [place]  [address] ? [address] [address], [place], [place]   [address] [phone] ? [phone] ? FAX [phone]   

## 2021-07-17 NOTE — Progress Notes (Signed)
DISCHARGE NOTE HOME Omar Travis to be discharged Home per MD order. Discussed prescriptions and follow up appointments with the patient. Prescriptions given to patient; medication list explained in detail. Patient verbalized understanding.  Skin clean, dry and intact without evidence of skin break down, no evidence of skin tears noted. IV catheter discontinued intact. Site without signs and symptoms of complications. Dressing and pressure applied. Pt denies pain at the site currently. No complaints noted.  Patient free of lines, drains, and wounds.   An After Visit Summary (AVS) was printed and given to the patient. Patient escorted via wheelchair, and discharged home via private auto.  Vira Agar, RN

## 2021-07-18 ENCOUNTER — Encounter (HOSPITAL_COMMUNITY): Payer: Self-pay | Admitting: Surgery

## 2021-07-18 ENCOUNTER — Telehealth: Payer: Self-pay | Admitting: Family Medicine

## 2021-07-18 LAB — SURGICAL PATHOLOGY

## 2021-07-18 NOTE — Telephone Encounter (Signed)
Pt wife betty jo is calling and she tried to call [facility] to cancel the hida scan sch for tomorrow and was told to call md office. Pt had  emergency surgery and had gallbladder removed on 07-17-2021

## 2021-07-19 ENCOUNTER — Ambulatory Visit (HOSPITAL_COMMUNITY): Payer: BC Managed Care – PPO

## 2021-07-19 ENCOUNTER — Telehealth: Payer: Self-pay

## 2021-07-19 NOTE — Telephone Encounter (Signed)
Transition Care Management Unsuccessful Follow-up Telephone Call  Date of discharge and from where:  [facility]  Attempts:  1st Attempt  Reason for unsuccessful TCM follow-up call:  Unable to reach patient

## 2021-07-19 NOTE — Telephone Encounter (Signed)
Appointment was cancelled.

## 2021-07-22 ENCOUNTER — Emergency Department (HOSPITAL_COMMUNITY): Payer: BC Managed Care – PPO

## 2021-07-22 ENCOUNTER — Other Ambulatory Visit: Payer: Self-pay

## 2021-07-22 ENCOUNTER — Encounter (HOSPITAL_COMMUNITY): Payer: Self-pay

## 2021-07-22 ENCOUNTER — Ambulatory Visit: Payer: BC Managed Care – PPO

## 2021-07-22 ENCOUNTER — Inpatient Hospital Stay (HOSPITAL_COMMUNITY)
Admission: EM | Admit: 2021-07-22 | Discharge: 2021-07-30 | DRG: 864 | Disposition: A | Discharge status: Home / Self Care | Payer: BC Managed Care – PPO | Attending: Internal Medicine | Admitting: Internal Medicine

## 2021-07-22 DIAGNOSIS — Z79899 Other long term (current) drug therapy: Secondary | ICD-10-CM

## 2021-07-22 DIAGNOSIS — E871 Hypo-osmolality and hyponatremia: Secondary | ICD-10-CM | POA: Diagnosis not present

## 2021-07-22 DIAGNOSIS — Z7985 Long-term (current) use of injectable non-insulin antidiabetic drugs: Secondary | ICD-10-CM | POA: Diagnosis not present

## 2021-07-22 DIAGNOSIS — R6510 Systemic inflammatory response syndrome (SIRS) of non-infectious origin without acute organ dysfunction: Secondary | ICD-10-CM | POA: Diagnosis not present

## 2021-07-22 DIAGNOSIS — E860 Dehydration: Secondary | ICD-10-CM | POA: Diagnosis present

## 2021-07-22 DIAGNOSIS — R918 Other nonspecific abnormal finding of lung field: Secondary | ICD-10-CM | POA: Diagnosis not present

## 2021-07-22 DIAGNOSIS — Z9049 Acquired absence of other specified parts of digestive tract: Secondary | ICD-10-CM

## 2021-07-22 DIAGNOSIS — Z23 Encounter for immunization: Secondary | ICD-10-CM

## 2021-07-22 DIAGNOSIS — N281 Cyst of kidney, acquired: Secondary | ICD-10-CM | POA: Diagnosis not present

## 2021-07-22 DIAGNOSIS — R700 Elevated erythrocyte sedimentation rate: Secondary | ICD-10-CM | POA: Diagnosis present

## 2021-07-22 DIAGNOSIS — E781 Pure hyperglyceridemia: Secondary | ICD-10-CM | POA: Diagnosis present

## 2021-07-22 DIAGNOSIS — K7460 Unspecified cirrhosis of liver: Secondary | ICD-10-CM | POA: Diagnosis not present

## 2021-07-22 DIAGNOSIS — E1165 Type 2 diabetes mellitus with hyperglycemia: Secondary | ICD-10-CM | POA: Diagnosis present

## 2021-07-22 DIAGNOSIS — K8309 Other cholangitis: Secondary | ICD-10-CM

## 2021-07-22 DIAGNOSIS — Z833 Family history of diabetes mellitus: Secondary | ICD-10-CM | POA: Diagnosis not present

## 2021-07-22 DIAGNOSIS — D61818 Other pancytopenia: Secondary | ICD-10-CM | POA: Diagnosis not present

## 2021-07-22 DIAGNOSIS — Z794 Long term (current) use of insulin: Secondary | ICD-10-CM | POA: Diagnosis not present

## 2021-07-22 DIAGNOSIS — R161 Splenomegaly, not elsewhere classified: Secondary | ICD-10-CM | POA: Diagnosis not present

## 2021-07-22 DIAGNOSIS — I10 Essential (primary) hypertension: Secondary | ICD-10-CM | POA: Diagnosis present

## 2021-07-22 DIAGNOSIS — R000 Tachycardia, unspecified: Secondary | ICD-10-CM | POA: Diagnosis not present

## 2021-07-22 DIAGNOSIS — Z888 Allergy status to other drugs, medicaments and biological substances status: Secondary | ICD-10-CM | POA: Diagnosis not present

## 2021-07-22 DIAGNOSIS — Z6831 Body mass index (BMI) 31.0-31.9, adult: Secondary | ICD-10-CM | POA: Diagnosis not present

## 2021-07-22 DIAGNOSIS — R748 Abnormal levels of other serum enzymes: Secondary | ICD-10-CM | POA: Diagnosis present

## 2021-07-22 DIAGNOSIS — E119 Type 2 diabetes mellitus without complications: Secondary | ICD-10-CM | POA: Diagnosis not present

## 2021-07-22 DIAGNOSIS — R110 Nausea: Secondary | ICD-10-CM | POA: Diagnosis not present

## 2021-07-22 DIAGNOSIS — F32A Depression, unspecified: Secondary | ICD-10-CM | POA: Diagnosis present

## 2021-07-22 DIAGNOSIS — R0689 Other abnormalities of breathing: Secondary | ICD-10-CM | POA: Diagnosis not present

## 2021-07-22 DIAGNOSIS — R531 Weakness: Secondary | ICD-10-CM

## 2021-07-22 DIAGNOSIS — E785 Hyperlipidemia, unspecified: Secondary | ICD-10-CM | POA: Diagnosis not present

## 2021-07-22 DIAGNOSIS — E669 Obesity, unspecified: Secondary | ICD-10-CM | POA: Diagnosis not present

## 2021-07-22 DIAGNOSIS — K219 Gastro-esophageal reflux disease without esophagitis: Secondary | ICD-10-CM | POA: Diagnosis not present

## 2021-07-22 DIAGNOSIS — Z7984 Long term (current) use of oral hypoglycemic drugs: Secondary | ICD-10-CM | POA: Diagnosis not present

## 2021-07-22 DIAGNOSIS — R0902 Hypoxemia: Secondary | ICD-10-CM | POA: Diagnosis not present

## 2021-07-22 DIAGNOSIS — I959 Hypotension, unspecified: Secondary | ICD-10-CM | POA: Diagnosis not present

## 2021-07-22 DIAGNOSIS — J9811 Atelectasis: Secondary | ICD-10-CM | POA: Diagnosis not present

## 2021-07-22 DIAGNOSIS — R509 Fever, unspecified: Principal | ICD-10-CM | POA: Diagnosis present

## 2021-07-22 DIAGNOSIS — J341 Cyst and mucocele of nose and nasal sinus: Secondary | ICD-10-CM | POA: Diagnosis not present

## 2021-07-22 DIAGNOSIS — Z20822 Contact with and (suspected) exposure to covid-19: Secondary | ICD-10-CM | POA: Diagnosis present

## 2021-07-22 DIAGNOSIS — R911 Solitary pulmonary nodule: Secondary | ICD-10-CM | POA: Diagnosis not present

## 2021-07-22 LAB — CBC WITH DIFFERENTIAL/PLATELET
Abs Immature Granulocytes: 0.06 10*3/uL (ref 0.00–0.07)
Basophils Absolute: 0 10*3/uL (ref 0.0–0.1)
Basophils Relative: 0 %
Eosinophils Absolute: 0 10*3/uL (ref 0.0–0.5)
Eosinophils Relative: 0 %
HCT: 33.4 % — ABNORMAL LOW (ref 39.0–52.0)
Hemoglobin: 10.7 g/dL — ABNORMAL LOW (ref 13.0–17.0)
Immature Granulocytes: 1 %
Lymphocytes Relative: 2 %
Lymphs Abs: 0.2 10*3/uL — ABNORMAL LOW (ref 0.7–4.0)
MCH: 27.5 pg (ref 26.0–34.0)
MCHC: 32 g/dL (ref 30.0–36.0)
MCV: 85.9 fL (ref 80.0–100.0)
Monocytes Absolute: 0.4 10*3/uL (ref 0.1–1.0)
Monocytes Relative: 5 %
Neutro Abs: 6.8 10*3/uL (ref 1.7–7.7)
Neutrophils Relative %: 92 %
Platelets: 120 10*3/uL — ABNORMAL LOW (ref 150–400)
RBC: 3.89 MIL/uL — ABNORMAL LOW (ref 4.22–5.81)
RDW: 14.1 % (ref 11.5–15.5)
WBC: 7.4 10*3/uL (ref 4.0–10.5)
nRBC: 0 % (ref 0.0–0.2)

## 2021-07-22 LAB — URINALYSIS, ROUTINE W REFLEX MICROSCOPIC
Bacteria, UA: NONE SEEN
Bilirubin Urine: NEGATIVE
Glucose, UA: >=500 mg/dL — AB
Ketones, ur: 20 mg/dL — AB
Leukocytes,Ua: NEGATIVE
Nitrite: NEGATIVE
Protein, ur: NEGATIVE mg/dL
Specific Gravity, Urine: 1.036 — ABNORMAL HIGH (ref 1.005–1.030)
pH: 5 (ref 5.0–8.0)

## 2021-07-22 LAB — COMPREHENSIVE METABOLIC PANEL
ALT: 44 U/L (ref 0–44)
AST: 28 U/L (ref 15–41)
Albumin: 2.6 g/dL — ABNORMAL LOW (ref 3.5–5.0)
Alkaline Phosphatase: 115 U/L (ref 38–126)
Anion gap: 10 (ref 5–15)
BUN: 16 mg/dL (ref 6–20)
CO2: 19 mmol/L — ABNORMAL LOW (ref 22–32)
Calcium: 7.8 mg/dL — ABNORMAL LOW (ref 8.9–10.3)
Chloride: 102 mmol/L (ref 98–111)
Creatinine, Ser: 0.91 mg/dL (ref 0.61–1.24)
GFR, Estimated: >60 mL/min (ref >60)
Glucose, Bld: 123 mg/dL — ABNORMAL HIGH (ref 70–99)
Potassium: 3.3 mmol/L — ABNORMAL LOW (ref 3.5–5.1)
Sodium: 131 mmol/L — ABNORMAL LOW (ref 135–145)
Total Bilirubin: 1.6 mg/dL — ABNORMAL HIGH (ref 0.3–1.2)
Total Protein: 5.7 g/dL — ABNORMAL LOW (ref 6.5–8.1)

## 2021-07-22 LAB — RESP PANEL BY RT-PCR (FLU A&B, COVID) ARPGX2
Influenza A by PCR: NEGATIVE
Influenza B by PCR: NEGATIVE
SARS Coronavirus 2 by RT PCR: NEGATIVE

## 2021-07-22 LAB — LACTIC ACID, PLASMA
Lactic Acid, Venous: 1 mmol/L (ref 0.5–1.9)
Lactic Acid, Venous: 0.9 mmol/L (ref 0.5–1.9)

## 2021-07-22 LAB — APTT: aPTT: 32 seconds (ref 24–36)

## 2021-07-22 LAB — SEDIMENTATION RATE: Sed Rate: 89 mm/hr — ABNORMAL HIGH (ref 0–16)

## 2021-07-22 LAB — PROCALCITONIN: Procalcitonin: 67.15 ng/mL

## 2021-07-22 LAB — C-REACTIVE PROTEIN: CRP: 34.8 mg/dL — ABNORMAL HIGH (ref <1.0)

## 2021-07-22 LAB — GLUCOSE, CAPILLARY: Glucose-Capillary: 162 mg/dL — ABNORMAL HIGH (ref 70–99)

## 2021-07-22 LAB — PROTIME-INR
INR: 1.3 — ABNORMAL HIGH (ref 0.8–1.2)
Prothrombin Time: 15.9 seconds — ABNORMAL HIGH (ref 11.4–15.2)

## 2021-07-22 MED ORDER — LISINOPRIL 5 MG PO TABS
5.0000 mg | ORAL_TABLET | Freq: Every day | ORAL | Status: DC
  Administered 2021-07-22 – 2021-07-30 (×9): 5 mg via ORAL
  Filled 2021-07-22 (×9): qty 1

## 2021-07-22 MED ORDER — ONDANSETRON HCL 4 MG PO TABS: 4.0000 mg | ORAL_TABLET | Freq: Four times a day (QID) | ORAL | Status: DC | PRN

## 2021-07-22 MED ORDER — SODIUM CHLORIDE 0.9 % IV SOLN
3.0000 g | Freq: Four times a day (QID) | INTRAVENOUS | Status: DC
  Administered 2021-07-22 – 2021-07-25 (×11): 3 g via INTRAVENOUS
  Filled 2021-07-22: qty 3
  Filled 2021-07-22: qty 8
  Filled 2021-07-22 (×2): qty 3
  Filled 2021-07-22 (×5): qty 8
  Filled 2021-07-22: qty 3
  Filled 2021-07-22 (×3): qty 8
  Filled 2021-07-22: qty 3

## 2021-07-22 MED ORDER — INFLUENZA VAC SPLIT QUAD 0.5 ML IM SUSY
0.5000 mL | PREFILLED_SYRINGE | INTRAMUSCULAR | Status: AC
  Administered 2021-07-30: 0.5 mL via INTRAMUSCULAR
  Filled 2021-07-22: qty 0.5

## 2021-07-22 MED ORDER — ACETAMINOPHEN 325 MG PO TABS
650.0000 mg | ORAL_TABLET | Freq: Four times a day (QID) | ORAL | Status: DC | PRN
  Administered 2021-07-22 – 2021-07-28 (×10): 650 mg via ORAL
  Filled 2021-07-22 (×10): qty 2

## 2021-07-22 MED ORDER — ROSUVASTATIN CALCIUM 5 MG PO TABS
5.0000 mg | ORAL_TABLET | Freq: Every day | ORAL | Status: DC
  Administered 2021-07-22 – 2021-07-30 (×9): 5 mg via ORAL
  Filled 2021-07-22 (×9): qty 1

## 2021-07-22 MED ORDER — PANTOPRAZOLE SODIUM 40 MG PO TBEC
40.0000 mg | DELAYED_RELEASE_TABLET | Freq: Every day | ORAL | Status: DC
Start: 2021-07-22 — End: 2021-07-30
  Administered 2021-07-22 – 2021-07-30 (×9): 40 mg via ORAL
  Filled 2021-07-22 (×9): qty 1

## 2021-07-22 MED ORDER — ENOXAPARIN SODIUM 40 MG/0.4ML IJ SOSY
40.0000 mg | PREFILLED_SYRINGE | INTRAMUSCULAR | Status: DC
  Administered 2021-07-22 – 2021-07-29 (×8): 40 mg via SUBCUTANEOUS
  Filled 2021-07-22 (×8): qty 0.4

## 2021-07-22 MED ORDER — INSULIN ASPART 100 UNIT/ML IJ SOLN
0.0000 [IU] | Freq: Every day | INTRAMUSCULAR | Status: DC
  Administered 2021-07-27: 2 [IU] via SUBCUTANEOUS

## 2021-07-22 MED ORDER — ESCITALOPRAM OXALATE 10 MG PO TABS
10.0000 mg | ORAL_TABLET | Freq: Every day | ORAL | Status: DC
  Administered 2021-07-22 – 2021-07-30 (×9): 10 mg via ORAL
  Filled 2021-07-22 (×9): qty 1

## 2021-07-22 MED ORDER — SODIUM CHLORIDE 0.9 % IV SOLN: INTRAVENOUS | Status: DC

## 2021-07-22 MED ORDER — INSULIN ASPART 100 UNIT/ML IJ SOLN
0.0000 [IU] | Freq: Three times a day (TID) | INTRAMUSCULAR | Status: DC
  Administered 2021-07-24: 1 [IU] via SUBCUTANEOUS
  Administered 2021-07-24 – 2021-07-25 (×2): 2 [IU] via SUBCUTANEOUS
  Administered 2021-07-25: 1 [IU] via SUBCUTANEOUS
  Administered 2021-07-26: 3 [IU] via SUBCUTANEOUS
  Administered 2021-07-26: 2 [IU] via SUBCUTANEOUS
  Administered 2021-07-26 – 2021-07-27 (×2): 1 [IU] via SUBCUTANEOUS
  Administered 2021-07-27: 3 [IU] via SUBCUTANEOUS
  Administered 2021-07-27: 2 [IU] via SUBCUTANEOUS
  Administered 2021-07-28: 3 [IU] via SUBCUTANEOUS
  Administered 2021-07-28 – 2021-07-30 (×3): 1 [IU] via SUBCUTANEOUS

## 2021-07-22 MED ORDER — SODIUM CHLORIDE 0.9 % IV BOLUS (SEPSIS)
1000.0000 mL | Freq: Once | INTRAVENOUS | Status: AC
  Administered 2021-07-22: 1000 mL via INTRAVENOUS

## 2021-07-22 MED ORDER — IOHEXOL 300 MG/ML  SOLN
100.0000 mL | Freq: Once | INTRAMUSCULAR | Status: AC | PRN
  Administered 2021-07-22: 100 mL via INTRAVENOUS

## 2021-07-22 MED ORDER — LACTATED RINGERS IV SOLN: INTRAVENOUS | Status: DC

## 2021-07-22 MED ORDER — ONDANSETRON HCL 4 MG/2ML IJ SOLN: 4.0000 mg | Freq: Four times a day (QID) | INTRAMUSCULAR | Status: DC | PRN

## 2021-07-22 MED ORDER — IOHEXOL 350 MG/ML SOLN
75.0000 mL | Freq: Once | INTRAVENOUS | Status: AC | PRN
  Administered 2021-07-22: 75 mL via INTRAVENOUS

## 2021-07-22 NOTE — Progress Notes (Signed)
Notified provider of need to order antibiotics.   

## 2021-07-22 NOTE — Progress Notes (Signed)
   07/22/21 2315  Assess: MEWS Score  Temp (!) 102.7 F (39.3 C)  BP (!) 104/59  Pulse Rate (!) 119  Resp (!) 21  Level of Consciousness Alert  SpO2 92 %  O2 Device Room Air  Assess: MEWS Score  MEWS Temp 2  MEWS Systolic 0  MEWS Pulse 2  MEWS RR 1  MEWS LOC 0  MEWS Score 5  MEWS Score Color Red  Assess: if the MEWS score is Yellow or Red  Were vital signs taken at a resting state? Yes  Focused Assessment No change from prior assessment  Early Detection of Sepsis Score *See Row Information* High  MEWS guidelines implemented *See Row Information* Yes  Treat  MEWS Interventions Administered scheduled meds/treatments;Administered prn meds/treatments;Other (Comment) (MD made aware)  Pain Scale 0-10  Pain Score 0  Take Vital Signs  Increase Vital Sign Frequency  Red: Q 1hr X 4 then Q 4hr X 4, if remains red, continue Q 4hrs  Notify: Charge Nurse/RN  Name of Charge Nurse/RN Notified Reather Converse, RN  Date Charge Nurse/RN Notified 07/22/21  Time Charge Nurse/RN Notified 2328  Notify: Provider  Provider Name/Title Carlynn Purl, DO  Date Provider Notified 07/22/21  Time Provider Notified 2329  Notification Type Page  Notification Reason Other (Comment) (Red Mews)  Provider response No new orders  Date of Provider Response 07/22/21  Time of Provider Response 2332

## 2021-07-22 NOTE — Telephone Encounter (Signed)
Transition Care Management Unsuccessful Follow-up Telephone Call  Date of discharge and from where:  Omar Travis  07/17/2021  Attempts:  2nd Attempt  Reason for unsuccessful TCM follow-up call:  Unable to reach patient

## 2021-07-22 NOTE — ED Triage Notes (Addendum)
Pt arrived via EMS.  Pt had ERCP 10/25 and cholecystectomy 10/26. Pt was seen at [facility] 07/14/21 for code sepsis. Pt finished prescribed ABX with no relief. Pt states chills, diaphoretic, and nausea. EMS administered 1G rocephin IM.

## 2021-07-22 NOTE — Progress Notes (Signed)
   07/22/21 1744  Assess: MEWS Score  Level of Consciousness Alert  Assess: MEWS Score  MEWS Temp 0  MEWS Systolic 0  MEWS Pulse 2  MEWS RR 2  MEWS LOC 0  MEWS Score 4  MEWS Score Color Red  Assess: if the MEWS score is Yellow or Red  Were vital signs taken at a resting state? Yes  Focused Assessment No change from prior assessment  Early Detection of Sepsis Score *See Row Information* High  MEWS guidelines implemented *See Row Information* Yes  Treat  MEWS Interventions Other (Comment) (MD made aware)  Pain Scale 0-10  Pain Score 0  Take Vital Signs  Increase Vital Sign Frequency  Red: Q 1hr X 4 then Q 4hr X 4, if remains red, continue Q 4hrs  Escalate  MEWS: Escalate Red: discuss with charge nurse/RN and provider, consider discussing with RRT  Notify: Charge Nurse/RN  Name of Charge Nurse/RN Notified Audrea Muscat RN  Date Charge Nurse/RN Notified 07/22/21  Time Charge Nurse/RN Notified 8295  Notify: Provider  Provider Name/Title Dyann Kief  Date Provider Notified 07/22/21  Time Provider Notified 1754  Notification Type Page  Notification Reason Other (Comment) (Mews score change)  Provider response No new orders;Other (Comment) (awaiting for response)

## 2021-07-22 NOTE — Progress Notes (Signed)
[facility]link following for code sepsis 

## 2021-07-22 NOTE — H&P (Signed)
History and Physical    Omar Travis OPF:[id number] DOB: 1965-10-30 DOA: 07/22/2021  PCP: Billie Ruddy, MD   Patient coming from: home  I have personally briefly reviewed patient's old medical records in [place]  Chief Complaint: acute febrile illness.  HPI: Omar Travis is a 55 y.o. male with medical history significant of type 2 diabetes, HTN, HLD, GERD, depression and recent admission due to cholecystitis, choledocholithiasis and cholangitis; who presented to the hospital secondary general malaise, chills and anorexia. Patient reported than since his discharge (6 days ago) he was doing good and progressing, until approx 2 days prior to admission when he noticed chills, general malaise and weakness. Patient reported anorexia and decrease liquid intake. Patient denies CP, nausea, vomiting, dysuria, overt bleeding, focal weakness or any other complaints.  Patient is vaccinated with Moderna, but not booster. ED COVID PCR negative.  ED Course: patient met SIRS criteria on admission (fever, tachycardia and RR), acute febrile illness appreciated, normal WBC's and currently no source of infection found. Cultures taken, empiric antibiotics initiated and TRH consulted to place in the hospital for further evaluation and management. CT abd pelvis w/o acute abnormalities and only changes from recent surgery; CT chest rule out PE and acute cardiopulmonary process. Patient was found dehydrated with increase urine specific gravity   Review of Systems: As per HPI otherwise all other systems reviewed and are negative.   Past Medical History:  Diagnosis Date   Diabetes ([place])    GERD (gastroesophageal reflux disease)    HTN (hypertension)    Hyperlipidemia     Past Surgical History:  Procedure Laterality Date   CHOLECYSTECTOMY N/A 07/17/2021   Procedure: LAPAROSCOPIC CHOLECYSTECTOMY;  Surgeon: Clovis Riley, MD;  Location: [facility];  Service: General;  Laterality: N/A;    ENDOSCOPIC RETROGRADE CHOLANGIOPANCREATOGRAPHY (ERCP) WITH PROPOFOL N/A 07/16/2021   Procedure: ENDOSCOPIC RETROGRADE CHOLANGIOPANCREATOGRAPHY (ERCP) WITH PROPOFOL;  Surgeon: Clarene Essex, MD;  Location: [facility];  Service: Endoscopy;  Laterality: N/A;   PANCREATIC STENT PLACEMENT  07/16/2021   Procedure: PANCREATIC STENT PLACEMENT;  Surgeon: Clarene Essex, MD;  Location: [facility] ENDOSCOPY;  Service: Endoscopy;;   REMOVAL OF STONES  07/16/2021   Procedure: REMOVAL OF STONES;  Surgeon: Clarene Essex, MD;  Location: [facility] ENDOSCOPY;  Service: Endoscopy;;   SPHINCTEROTOMY  07/16/2021   Procedure: Joan Mayans;  Surgeon: Clarene Essex, MD;  Location: [facility] ENDOSCOPY;  Service: Endoscopy;;    Social History  reports that he has never smoked. He has never used smokeless tobacco. He reports current alcohol use. He reports that he does not use drugs.  Allergies  Allergen Reactions   Benadryl Allergy [Diphenhydramine Hcl] Rash   Flexeril [Cyclobenzaprine]     Causes "breakout"    Family History  Problem Relation Age of Onset   Diabetes Mother    Liver cancer Father    Diabetes Father     Prior to Admission medications   Medication Sig Start Date End Date Taking? Authorizing Provider  canagliflozin (INVOKANA) 300 MG TABS tablet Take 300 mg by mouth daily before breakfast.   Yes [provider]  dicyclomine (BENTYL) 10 MG capsule Take 10 mg by mouth 3 (three) times daily as needed for spasms. 05/08/21  Yes [provider]  escitalopram (LEXAPRO) 10 MG tablet TAKE ONE (1) TABLET EACH DAY Patient taking differently: Take 10 mg by mouth daily. 06/12/21  Yes Billie Ruddy, MD  fenofibrate (TRICOR) 145 MG tablet Take 145 mg by mouth daily.   Yes  [provider]  insulin glargine, 1 Unit Dial, (TOUJEO SOLOSTAR) 300 UNIT/ML Solostar Pen Inject 54 Units into the skin daily. Patient taking differently: Inject 56 Units into the skin daily. 01/16/20  Yes Billie Ruddy, MD  lisinopril  (ZESTRIL) 5 MG tablet TAKE ONE (1) TABLET EACH DAY Patient taking differently: Take 5 mg by mouth daily. 03/26/21  Yes Billie Ruddy, MD  omeprazole (PRILOSEC) 20 MG capsule Take 1 capsule (20 mg total) by mouth daily. 06/25/20  Yes Billie Ruddy, MD  ondansetron (ZOFRAN-ODT) 8 MG disintegrating tablet Take 8 mg by mouth every 8 (eight) hours as needed for nausea. 05/08/21  Yes [provider]  oxyCODONE (OXY IR/ROXICODONE) 5 MG immediate release tablet Take 1 tablet (5 mg total) by mouth every 6 (six) hours as needed for moderate pain or severe pain (not relieved by tylenol or ibuprofen.). 07/17/21  Yes Simaan, Darci Current, PA-C  rosuvastatin (CRESTOR) 5 MG tablet Take 5 mg by mouth daily.   Yes [provider]  Semaglutide (OZEMPIC, 1 MG/DOSE, [employer]) Inject 1 mg into the skin once a week.   Yes [provider]  senna (SENOKOT) 8.6 MG TABS tablet Take 2 tablets (17.2 mg total) by mouth at bedtime as needed for mild constipation or moderate constipation. Patient not taking: Reported on 07/22/2021 07/17/21   Damita Lack, MD    Physical Exam: Vitals:   07/22/21 1330 07/22/21 1400 07/22/21 1503 07/22/21 1647  BP: 108/72 111/67    Pulse: (!) 115 (!) 111    Resp: (!) 23 (!) 21    Temp:   100 F (37.8 C) 100.1 F (37.8 C)  TempSrc:   Axillary Oral  SpO2: 95% 93%    Weight:      Height:        Constitutional: In no major distress, patient denies nausea, vomiting, chest pain, abdominal pain.  Vitals:   07/22/21 1330 07/22/21 1400 07/22/21 1503 07/22/21 1647  BP: 108/72 111/67    Pulse: (!) 115 (!) 111    Resp: (!) 23 (!) 21    Temp:   100 F (37.8 C) 100.1 F (37.8 C)  TempSrc:   Axillary Oral  SpO2: 95% 93%    Weight:      Height:       Eyes: PERRL, lids and conjunctivae normal, no icterus, no nystagmus, no jaundice appreciated. ENMT: Mucous membranes are moist. Posterior pharynx clear of any exudate or lesions. Normal dentition.  Neck: normal,  supple, no masses, no thyromegaly, no JVD. Respiratory: Positive tachypnea appreciated; no using accessory muscle.  No wheezing or crackles on exam.  Mild scattered rhonchi at auscultated. Cardiovascular: Sinus tachycardia, no rubs, no gallops, no JVD. Abdomen: no tenderness, no masses palpated. No hepatosplenomegaly. Bowel sounds positive.  Musculoskeletal: no clubbing / cyanosis. No joint deformity upper and lower extremities. Good ROM, no contractures. Normal muscle tone.  Skin: no petechiae. Neurologic: CN 2-12 grossly intact. Sensation intact, DTR normal. Strength 5/5 in all 4.  Psychiatric: Normal judgment and insight. Alert and oriented x 3. Normal mood.    Labs on Admission: I have personally reviewed following labs and imaging studies  CBC: Recent Labs  Lab 07/16/21 0445 07/17/21 0250 07/22/21 1048  WBC 5.9 4.5 7.4  NEUTROABS  --   --  6.8  HGB 12.6* 12.2* 10.7*  HCT 39.1 37.9* 33.4*  MCV 85.0 85.4 85.9  PLT 137* 132* 120*    Basic Metabolic Panel: Recent Labs  Lab 07/16/21  [id number] 07/17/21 0250 07/22/21 1048  NA 134* 136 131*  K 3.7 3.8 3.3*  CL 100 102 102  CO2 24 25 19*  GLUCOSE 97 107* 123*  BUN _0 CREATININE 0.94 0.87 0.91  CALCIUM 8.5* 8.6* 7.8*    GFR: Estimated Creatinine Clearance: 119.8 mL/min (by C-G formula based on SCr of 0.91 mg/dL).  Liver Function Tests: Recent Labs  Lab 07/16/21 0445 07/17/21 0250 07/22/21 1048  AST 82* 36 28  ALT 173* 115* 44  ALKPHOS 121 108 115  BILITOT 2.4* 1.8* 1.6*  PROT 5.8* 5.6* 5.7*  ALBUMIN 2.8* 2.8* 2.6*    Urine analysis:    Component Value Date/Time   COLORURINE YELLOW 07/22/2021 1321   APPEARANCEUR CLEAR 07/22/2021 1321   LABSPEC 1.036 (H) 07/22/2021 1321   PHURINE 5.0 07/22/2021 1321   GLUCOSEU >=500 (A) 07/22/2021 1321   HGBUR SMALL (A) 07/22/2021 1321   BILIRUBINUR NEGATIVE 07/22/2021 1321   KETONESUR 20 (A) 07/22/2021 1321   PROTEINUR NEGATIVE 07/22/2021 1321   NITRITE NEGATIVE  07/22/2021 1321   LEUKOCYTESUR NEGATIVE 07/22/2021 1321    Radiological Exams on Admission: CT Angio Chest PE W and/or Wo Contrast  Result Date: 07/22/2021 CLINICAL DATA:  Diaphoresis, fever, recent gallbladder surgery, code sepsis, high clinical suspicion of pulmonary embolism EXAM: CT ANGIOGRAPHY CHEST WITH CONTRAST TECHNIQUE: Multidetector CT imaging of the chest was performed using the standard protocol during bolus administration of intravenous contrast. Multiplanar CT image reconstructions and MIPs were obtained to evaluate the vascular anatomy. CONTRAST:  27m OMNIPAQUE IOHEXOL 350 MG/ML SOLN IV COMPARISON:  None FINDINGS: Cardiovascular: Heart unremarkable. No pericardial effusion. Aorta normal caliber without aneurysm or dissection. Pulmonary arteries adequately opacified. Scattered respiratory motion artifacts in lower lobes. No pulmonary emboli identified. Mediastinum/Nodes: Esophagus unremarkable. No thoracic adenopathy. Base of cervical region normal appearance. Lungs/Pleura: Minimal dependent atelectasis posterior lower lobes. Lungs otherwise clear. No pulmonary infiltrate, pleural effusion, or pneumothorax. 2 mm RIGHT lower lobe nodule image 68. Questionable tiny nodule versus scar 3 mm diameter RIGHT middle lobe image 65. 3 mm subpleural nodular density LEFT lower lobe image 105. Upper Abdomen: Chronic atrophy and intrahepatic bilary dilatation RIGHT lobe liver medially unchanged from prior exams. Few foci of gas at porta hepatis, likely external to biliary tree, suspect related to recent ERCP. Minimal bilary air LEFT lobe liver post ERCP. No other upper abdominal free air. Upper normal splenic size. Musculoskeletal: Unremarkable. Review of the MIP images confirms the above findings. IMPRESSION: No evidence of pulmonary embolism. No definite acute intrathoracic abnormalities. Few foci of gas at porta hepatis, suspect related to recent ERCP. Few tiny lung nodules; no follow-up needed if  patient is low-risk (and has no known or suspected primary neoplasm). Non-contrast chest CT can be considered in 12 months if patient is high-risk. This recommendation follows the consensus statement: Guidelines for Management of Incidental Pulmonary Nodules Detected on CT Images: From the Fleischner Society 2017; Radiology 2017; 284:228-243. Aortic Atherosclerosis (ICD10-I70.0). Findings discussed with Dr. LLaverta Baltimoreon 07/22/2021 at 1455 hours. Electronically Signed   By: MLavonia DanaM.D.   On: 07/22/2021 14:57   CT ABDOMEN PELVIS W CONTRAST  Result Date: 07/22/2021 CLINICAL DATA:  Fever. ERCP and cholecystectomy 5/6 days ago. Recent therapy of antibiotics. Chills. Nausea. Diabetes. Prior pancreatic stent. Sphincterotomy. EXAM: CT ABDOMEN AND PELVIS WITH CONTRAST TECHNIQUE: Multidetector CT imaging of the abdomen and pelvis was performed using the standard protocol following bolus administration of intravenous contrast. CONTRAST:  1027mOMNIPAQUE IOHEXOL 300 MG/ML  SOLN COMPARISON:  07/14/2021 abdominopelvic CT.  MRI of 07/14/2021 FINDINGS: Lower chest: 3 mm left lower lobe pulmonary nodule on 07/05 similar to on the recent exam and not described on the report of 04/28/2014. Images not available. Normal heart size without pericardial or pleural effusion. Hepatobiliary: Subtly irregular hepatic capsule with prominence of the lateral segment left lobe. Chronic marked biliary duct dilatation involving segment 6 and the caudate lobe. Trace pneumobilia within the left hepatic lobe which is likely iatrogenic. Interval cholecystectomy. No common duct dilatation or evidence of choledocholithiasis. No fluid collection within the operative bed. Mild pericholecystic edema and trace air are expected in the postoperative setting. Pancreas: Normal, without mass or ductal dilatation. Spleen: Splenomegaly at 15.6 cm craniocaudal. Maximal transverse 12.8 x 6.6 cm. (volume = 690). Adrenals/Urinary Tract: Normal adrenal glands. Left  greater than right renal sinus cysts. No hydronephrosis. Normal urinary bladder. Stomach/Bowel: Normal stomach, without wall thickening. Nonspecific submucosal fat prominence involving the ascending colon. Normal terminal ileum and appendix. Normal small bowel. Vascular/Lymphatic: Normal caliber of the aorta and branch vessels. Patent portal and splenic veins. Mildly prominent veins in the splenic hilum. No abdominopelvic adenopathy. small nodes within the jejunal mesentery with increased density in the mesenteric fat on 44/2. No pelvic sidewall adenopathy. Reproductive: Normal prostate. Other: No free intraperitoneal air.  No free fluid. Musculoskeletal: No acute osseous abnormality. IMPRESSION: 1. Expected appearance after cholecystectomy, without postoperative fluid collection, common duct dilatation, or choledocholithiasis. 2. Chronic marked intrahepatic biliary duct dilatation involving the right and caudate lobes, similar back to 2018 and by report back to 2015. This has been evaluated on multiple prior imaging studies, possibly the sequelae of remote insult. 3. Suspicion of mild cirrhosis with splenomegaly and possible portal venous hypertension. 4. Jejunal mesenteric findings which suggest adenitis/panniculitis, of indeterminate clinical significance. Can be seen in asymptomatic individuals. Present back to 08/19/2017. 5. 3 mm left lower lobe pulmonary nodule. No follow-up needed if patient is low-risk. Non-contrast chest CT can be considered in 12 months if patient is high-risk. This recommendation follows the consensus statement: Guidelines for Management of Incidental Pulmonary Nodules Detected on CT Images: From the Fleischner Society 2017; Radiology 2017; 284:228-243. Electronically Signed   By: Abigail Miyamoto M.D.   On: 07/22/2021 12:36   DG Chest Port 1 View  Result Date: 07/22/2021 CLINICAL DATA:  55 year old male with possible sepsis. EXAM: PORTABLE CHEST 1 VIEW COMPARISON:  No priors. FINDINGS:  Lung volumes are low. No consolidative airspace disease. No pleural effusions. No pneumothorax. No pulmonary nodule or mass noted. Pulmonary vasculature and the cardiomediastinal silhouette are within normal limits. IMPRESSION: 1. Low lung volumes without radiographic evidence of acute cardiopulmonary disease. Electronically Signed   By: Vinnie Langton M.D.   On: 07/22/2021 10:22    EKG: Independently reviewed.  Sinus tachycardia, no acute ischemic changes appreciated.  Assessment/Plan 1-SIRS secondary to acute febrile illness -No source of infection currently found -Normal lactic acid level -Will continue aggressive fluid resuscitation and supportive care -Empiric Unasyn has been started (during recent hospitalization, abdominal surgical intervention and concerns for potential aspiration) -Continue as needed antipyretics -Follow clinical response. -will check ESR, CRP and procalcitonin level.  2-poorly controlled type 2 diabetes mellitus on chronic insulin -Holding oral hypoglycemics -Continue sliding scale insulin and Levemir. -A1c 9.2 (07/08/2021)  3-hypertension -Continue lisinopril -Vital signs stable.  4-Gastroesophageal flux disease -Continue PPI  5-history of depression -No suicidal ideation or hallucination -Continue Lexapro  6-hyperlipidemia -Continue Crestor  7-class I obesity -Body mass index is  31.87 kg/m. -Low calorie diet, portion control and increase physical activity discussed with patient.  DVT prophylaxis: Lovenox Code Status:   Full code Family Communication:  No family at bedside. Disposition Plan:   Patient is from:  Home  Anticipated DC to:  Home  Anticipated DC date:  07/23/2021  Anticipated DC barriers: Complete work-up for acute febrile illness and provide fluid resuscitation.  Consults called:  None Admission status:  Observation, length of stay less than 2 midnights; telemetry bed.  Severity of Illness: The appropriate patient status for  this patient is OBSERVATION. Observation status is judged to be reasonable and necessary in order to provide the required intensity of service to ensure the patient's safety. The patient's presenting symptoms, physical exam findings, and initial radiographic and laboratory data in the context of their medical condition is felt to place them at decreased risk for further clinical deterioration. Furthermore, it is anticipated that the patient will be medically stable for discharge from the hospital within 2 midnights of admission.     Barton Dubois MD [facility]  How to contact the [facility] Attending or Consulting provider [place] or covering provider during after hours [place], for this patient?   Check the care team in [facility] and look for a) attending/consulting TR[facility] provider listed and b) the [facility] team listed Log into [email or web address] and use [place]'s universal password to access. If you do not have the password, please contact the hospital operator. Locate the [facility] provider you are looking for under [facility] and page to a number that you can be directly reached. If you still have difficulty reaching the provider, please page the [facility] (Director on Call) for the Hospitalists listed on amion for assistance.  07/22/2021, 4:56 PM

## 2021-07-22 NOTE — ED Notes (Signed)
Patient transported to CT 

## 2021-07-22 NOTE — Progress Notes (Signed)
Pharmacy Antibiotic Note  Omar Travis is a 55 y.o. male admitted on 07/22/2021 with  intra-abdominal .  Pharmacy has been consulted for Unasyn dosing.  Plan: Unasyn 3000 mg IV every 6 hours. Monitor labs, c/s, and patient improvement.  Height: 6' (182.9 cm) Weight: 106.6 kg (235 lb) IBW/kg (Calculated) : 77.6  Temp (24hrs), Avg:100.6 F (38.1 C), Min:100 F (37.8 C), Max:101.7 F (38.7 C)  Recent Labs  Lab 07/16/21 0445 07/17/21 0250 07/22/21 1048 07/22/21 1243  WBC 5.9 4.5 7.4  --   CREATININE 0.94 0.87 0.91  --   LATICACIDVEN  --   --  1.0 0.9    Estimated Creatinine Clearance: 119.8 mL/min (by C-G formula based on SCr of 0.91 mg/dL).    Allergies  Allergen Reactions   Benadryl Allergy [Diphenhydramine Hcl] Rash   Flexeril [Cyclobenzaprine]     Causes "breakout"    Antimicrobials this admission: Unasyn 10/31 >>  Microbiology results: 10/31 BCx: pending 10/31 UCx: pending    Thank you for allowing pharmacy to be a part of this patient's care.  Ramond Craver 07/22/2021 5:04 PM

## 2021-07-22 NOTE — Progress Notes (Signed)
Pt got rocephin per EMS

## 2021-07-22 NOTE — ED Provider Notes (Signed)
Emergency Department Provider Note   I have reviewed the triage vital signs and the nursing notes.   HISTORY  Chief Complaint Code Sepsis   HPI Omar Travis is a 55 y.o. male with PMH reviewed presents to the ED with recurrent fever/chills at home. Patient with recent admit for sepsis and suspected cholangitis. Had lap chole prior to d/c. Was feeling better at the time of discharge but has developed fever and shaking chills since d/c. Describes increased fatigue, poor appetite. No CP, SOB, or flu-like symptoms. Has noted some mild dysuria. No HA. No radiation of symptoms or modifying factors.      Past Medical History:  Diagnosis Date   Diabetes ([place])    GERD (gastroesophageal reflux disease)    HTN (hypertension)    Hyperlipidemia     Patient Active Problem List   Diagnosis Date Noted   Generalized weakness    FUO (fever of unknown origin) 07/24/2021   Pancytopenia ([place]) 07/24/2021   SIRS (systemic inflammatory response syndrome) ([place])    Febrile illness, acute 07/22/2021   Cholangitis 07/14/2021   Choledocholithiasis with acute cholecystitis    Depression, recurrent ([place]) 11/24/2019   Gastroesophageal reflux disease 06/22/2019   Hyperlipidemia 06/22/2019   Diabetes mellitus ([place])    Essential hypertension     Past Surgical History:  Procedure Laterality Date   CHOLECYSTECTOMY N/A 07/17/2021   Procedure: LAPAROSCOPIC CHOLECYSTECTOMY;  Surgeon: Clovis Riley, MD;  Location: [place];  Service: General;  Laterality: N/A;   ENDOSCOPIC RETROGRADE CHOLANGIOPANCREATOGRAPHY (ERCP) WITH PROPOFOL N/A 07/16/2021   Procedure: ENDOSCOPIC RETROGRADE CHOLANGIOPANCREATOGRAPHY (ERCP) WITH PROPOFOL;  Surgeon: Clarene Essex, MD;  Location: [place];  Service: Endoscopy;  Laterality: N/A;   PANCREATIC STENT PLACEMENT  07/16/2021   Procedure: PANCREATIC STENT PLACEMENT;  Surgeon: Clarene Essex, MD;  Location: [place];  Service: Endoscopy;;   REMOVAL OF STONES  07/16/2021    Procedure: REMOVAL OF STONES;  Surgeon: Clarene Essex, MD;  Location: [place];  Service: Endoscopy;;   SPHINCTEROTOMY  07/16/2021   Procedure: Joan Mayans;  Surgeon: Clarene Essex, MD;  Location: [facility] ENDOSCOPY;  Service: Endoscopy;;    Allergies Benadryl allergy [diphenhydramine hcl] and Flexeril [cyclobenzaprine]  Family History  Problem Relation Age of Onset   Diabetes Mother    Liver cancer Father    Diabetes Father     Social History Social History   Tobacco Use   Smoking status: Never   Smokeless tobacco: Never  Substance Use Topics   Alcohol use: Yes   Drug use: Never    Review of Systems  Constitutional: Positive fever/chills Eyes: No visual changes. ENT: No sore throat. Cardiovascular: Denies chest pain. Respiratory: Denies shortness of breath. Gastrointestinal: No abdominal pain.  No nausea, no vomiting.  No diarrhea.  No constipation. Genitourinary: Positive for dysuria. Musculoskeletal: Negative for back pain. Skin: Negative for rash. Neurological: Negative for headaches, focal weakness or numbness.  10-point ROS otherwise negative.  ____________________________________________   PHYSICAL EXAM:  VITAL SIGNS: ED Triage Vitals  Enc Vitals Group     BP 07/22/21 0955 103/62     Pulse Rate 07/22/21 0955 (!) 124     Resp 07/22/21 0955 (!) 26     Temp 07/22/21 0955 (!) 101.7 F (38.7 C)     Temp Source 07/22/21 0955 Oral     SpO2 07/22/21 0955 90 %     Weight 07/22/21 0952 235 lb (106.6 kg)     Height 07/22/21 0952 6' (1.829 m)   Constitutional: Alert  and oriented. Well appearing and in no acute distress. Eyes: Conjunctivae are normal.  Head: Atraumatic. Nose: No congestion/rhinnorhea. Mouth/Throat: Mucous membranes are moist.  Oropharynx non-erythematous. Neck: No stridor.   Cardiovascular: Normal rate, regular rhythm. Good peripheral circulation. Grossly normal heart sounds.   Respiratory: Normal respiratory effort.  No retractions. Lungs  CTAB. Gastrointestinal: Soft with appropriate tenderness s/p lap chole. Incisions are well appearing.  No distention.  Musculoskeletal: No lower extremity tenderness nor edema. No gross deformities of extremities. Neurologic:  Normal speech and language. No gross focal neurologic deficits are appreciated.  Skin:  Skin is warm, dry and intact. No rash noted.   ____________________________________________   LABS (all labs ordered are listed, but only abnormal results are displayed)  Labs Reviewed  COMPREHENSIVE METABOLIC PANEL - Abnormal; Notable for the following components:      Result Value   Sodium 131 (*)    Potassium 3.3 (*)    CO2 19 (*)    Glucose, Bld 123 (*)    Calcium 7.8 (*)    Total Protein 5.7 (*)    Albumin 2.6 (*)    Total Bilirubin 1.6 (*)    All other components within normal limits  CBC WITH DIFFERENTIAL/PLATELET - Abnormal; Notable for the following components:   RBC 3.89 (*)    Hemoglobin 10.7 (*)    HCT 33.4 (*)    Platelets 120 (*)    Lymphs Abs 0.2 (*)    All other components within normal limits  PROTIME-INR - Abnormal; Notable for the following components:   Prothrombin Time 15.9 (*)    INR 1.3 (*)    All other components within normal limits  URINALYSIS, ROUTINE W REFLEX MICROSCOPIC - Abnormal; Notable for the following components:   Specific Gravity, Urine 1.036 (*)    Glucose, UA >=500 (*)    Hgb urine dipstick SMALL (*)    Ketones, ur 20 (*)    All other components within normal limits  SEDIMENTATION RATE - Abnormal; Notable for the following components:   Sed Rate 89 (*)    All other components within normal limits  C-REACTIVE PROTEIN - Abnormal; Notable for the following components:   CRP 34.8 (*)    All other components within normal limits  COMPREHENSIVE METABOLIC PANEL - Abnormal; Notable for the following components:   Sodium 131 (*)    Potassium 3.2 (*)    CO2 21 (*)    Calcium 8.1 (*)    Total Protein 5.9 (*)    Albumin 2.6 (*)     AST 43 (*)    ALT 45 (*)    Total Bilirubin 1.5 (*)    All other components within normal limits  CBC - Abnormal; Notable for the following components:   RBC 3.84 (*)    Hemoglobin 10.6 (*)    HCT 32.8 (*)    Platelets 96 (*)    All other components within normal limits  GLUCOSE, CAPILLARY - Abnormal; Notable for the following components:   Glucose-Capillary 162 (*)    All other components within normal limits  GLUCOSE, CAPILLARY - Abnormal; Notable for the following components:   Glucose-Capillary 103 (*)    All other components within normal limits  CBC - Abnormal; Notable for the following components:   WBC 3.4 (*)    RBC 3.57 (*)    Hemoglobin 9.8 (*)    HCT 31.0 (*)    Platelets 90 (*)    All other components within normal limits  BASIC  METABOLIC PANEL - Abnormal; Notable for the following components:   Sodium 133 (*)    Glucose, Bld 114 (*)    Calcium 8.0 (*)    Anion gap 4 (*)    All other components within normal limits  GLUCOSE, CAPILLARY - Abnormal; Notable for the following components:   Glucose-Capillary 154 (*)    All other components within normal limits  GLUCOSE, CAPILLARY - Abnormal; Notable for the following components:   Glucose-Capillary 157 (*)    All other components within normal limits  URINALYSIS, COMPLETE (UACMP) WITH MICROSCOPIC - Abnormal; Notable for the following components:   Glucose, UA >=500 (*)    Hgb urine dipstick SMALL (*)    All other components within normal limits  GLUCOSE, CAPILLARY - Abnormal; Notable for the following components:   Glucose-Capillary 139 (*)    All other components within normal limits  GLUCOSE, CAPILLARY - Abnormal; Notable for the following components:   Glucose-Capillary 182 (*)    All other components within normal limits  GLUCOSE, CAPILLARY - Abnormal; Notable for the following components:   Glucose-Capillary 115 (*)    All other components within normal limits  GLUCOSE, CAPILLARY - Abnormal; Notable for  the following components:   Glucose-Capillary 122 (*)    All other components within normal limits  GLUCOSE, CAPILLARY - Abnormal; Notable for the following components:   Glucose-Capillary 152 (*)    All other components within normal limits  CBC WITH DIFFERENTIAL/PLATELET - Abnormal; Notable for the following components:   RBC 3.73 (*)    Hemoglobin 10.2 (*)    HCT 31.1 (*)    Platelets 112 (*)    All other components within normal limits  COMPREHENSIVE METABOLIC PANEL - Abnormal; Notable for the following components:   Sodium 134 (*)    Glucose, Bld 158 (*)    Calcium 8.3 (*)    Total Protein 5.3 (*)    Albumin 1.9 (*)    AST 43 (*)    Alkaline Phosphatase 164 (*)    All other components within normal limits  SEDIMENTATION RATE - Abnormal; Notable for the following components:   Sed Rate 91 (*)    All other components within normal limits  C-REACTIVE PROTEIN - Abnormal; Notable for the following components:   CRP 14.5 (*)    All other components within normal limits  GLUCOSE, CAPILLARY - Abnormal; Notable for the following components:   Glucose-Capillary 175 (*)    All other components within normal limits  GLUCOSE, CAPILLARY - Abnormal; Notable for the following components:   Glucose-Capillary 137 (*)    All other components within normal limits  GLUCOSE, CAPILLARY - Abnormal; Notable for the following components:   Glucose-Capillary 213 (*)    All other components within normal limits  GLUCOSE, CAPILLARY - Abnormal; Notable for the following components:   Glucose-Capillary 160 (*)    All other components within normal limits  CBC WITH DIFFERENTIAL/PLATELET - Abnormal; Notable for the following components:   RBC 3.73 (*)    Hemoglobin 10.1 (*)    HCT 31.4 (*)    Platelets 145 (*)    Abs Immature Granulocytes 0.12 (*)    All other components within normal limits  BASIC METABOLIC PANEL - Abnormal; Notable for the following components:   Glucose, Bld 149 (*)    Calcium  8.5 (*)    All other components within normal limits  GLUCOSE, CAPILLARY - Abnormal; Notable for the following components:   Glucose-Capillary 164 (*)  All other components within normal limits  GLUCOSE, CAPILLARY - Abnormal; Notable for the following components:   Glucose-Capillary 139 (*)    All other components within normal limits  GLUCOSE, CAPILLARY - Abnormal; Notable for the following components:   Glucose-Capillary 219 (*)    All other components within normal limits  CBC WITH DIFFERENTIAL/PLATELET - Abnormal; Notable for the following components:   RBC 3.57 (*)    Hemoglobin 9.7 (*)    HCT 30.5 (*)    All other components within normal limits  BASIC METABOLIC PANEL - Abnormal; Notable for the following components:   Sodium 132 (*)    Glucose, Bld 208 (*)    Calcium 8.2 (*)    Anion gap 4 (*)    All other components within normal limits  GLUCOSE, CAPILLARY - Abnormal; Notable for the following components:   Glucose-Capillary 173 (*)    All other components within normal limits  GLUCOSE, CAPILLARY - Abnormal; Notable for the following components:   Glucose-Capillary 234 (*)    All other components within normal limits  GLUCOSE, CAPILLARY - Abnormal; Notable for the following components:   Glucose-Capillary 217 (*)    All other components within normal limits  GLUCOSE, CAPILLARY - Abnormal; Notable for the following components:   Glucose-Capillary 147 (*)    All other components within normal limits  RESP PANEL BY RT-PCR (FLU A&B, COVID) [id number]  CULTURE, BLOOD (ROUTINE X 2)  CULTURE, BLOOD (ROUTINE X 2)  URINE CULTURE  CULTURE, BLOOD (ROUTINE X 2)  CULTURE, BLOOD (ROUTINE X 2)  URINE CULTURE  LACTIC ACID, PLASMA  LACTIC ACID, PLASMA  APTT  PROCALCITONIN  PROCALCITONIN  GLUCOSE, CAPILLARY  GLUCOSE, CAPILLARY  PROCALCITONIN  GLUCOSE, CAPILLARY  EHRLICHIA ANTIBODY PANEL  ROCKY MTN SPOTTED FVR ABS PNL(IGG+IGM)  LYME DISEASE SEROLOGY W/REFLEX  PROTIME-INR   HEPATITIS PANEL, ACUTE  LACTATE DEHYDROGENASE  CK  FERRITIN  RPR  AMMONIA  PROTIME-INR  ANTINUCLEAR ANTIBODIES, IFA  PROTEIN ELECTROPHORESIS, SERUM  EPSTEIN-BARR VIRUS (EBV) ANTIBODY PROFILE  CRYOGLOBULIN  RHEUMATOID FACTOR  CMV IGM  CMV ANTIBODY, IGG (EIA)   ____________________________________________  EKG   EKG Interpretation  Date/Time:  Monday July 22 2021 09:59:22 EDT Ventricular Rate:  121 PR Interval:  157 QRS Duration: 95 QT Interval:  323 QTC Calculation: 459 R Axis:   14 Text Interpretation: Sinus tachycardia Confirmed by Davonna Belling [phone]) on 07/23/2021 7:33:35 PM        ____________________________________________  RADIOLOGY  CT abdomen/pelvis reviewed along with CXR  ____________________________________________   PROCEDURES  Procedure(s) performed:   Procedures  None ____________________________________________   INITIAL IMPRESSION / ASSESSMENT AND PLAN / ED COURSE  Pertinent labs & imaging results that were available during my care of the patient were reviewed by me and considered in my medical decision making (see chart for details).   Patient presents to the ED with fever and chills after recent admit. Vitals are consistent with tachycardia. No hypotension. Patient describes fever, chills, and rigors at home. Question UTI vs PNA in the post-op setting. CT imaging with no acute findings. Labs not consistent with active sepsis. Question bacteremia. Plan for admit for IVF and will follow blood and urine cultures ordered.   Discussed patient's case with [facility] to request admission. Patient and family (if present) updated with plan. Care transferred to [facility] service.  I reviewed all nursing notes, vitals, pertinent old records, EKGs, labs, imaging (as available).    ____________________________________________  FINAL CLINICAL IMPRESSION(S) / ED DIAGNOSES  Final  diagnoses:  Generalized weakness  SIRS (systemic inflammatory  response syndrome) ([facility])     MEDICATIONS GIVEN DURING THIS VISIT:  Medications  enoxaparin (LOVENOX) injection 40 mg (40 mg Subcutaneous Given 07/27/21 1343)  acetaminophen (TYLENOL) tablet 650 mg (650 mg Oral Given 07/28/21 0348)  ondansetron (ZOFRAN) tablet 4 mg (has no administration in time range)    Or  ondansetron (ZOFRAN) injection 4 mg (has no administration in time range)  influenza vac split quadrivalent PF (FLUARIX) injection 0.5 mL (0.5 mLs Intramuscular Not Given 07/23/21 1127)  escitalopram (LEXAPRO) tablet 10 mg (10 mg Oral Given 07/28/21 1030)  lisinopril (ZESTRIL) tablet 5 mg (5 mg Oral Given 07/28/21 1030)  pantoprazole (PROTONIX) EC tablet 40 mg (40 mg Oral Given 07/28/21 1030)  rosuvastatin (CRESTOR) tablet 5 mg (5 mg Oral Given 07/28/21 1029)  insulin aspart (novoLOG) injection 0-9 Units (1 Units Subcutaneous Given 07/28/21 1244)  insulin aspart (novoLOG) injection 0-5 Units (2 Units Subcutaneous Given 07/27/21 2146)  ketorolac (TORADOL) 30 MG/ML injection 30 mg (has no administration in time range)  loratadine (CLARITIN) tablet 10 mg (10 mg Oral Given 07/28/21 1029)  sodium chloride (OCEAN) 0.65 % nasal spray 1 spray (1 spray Each Nare Given 07/24/21 1227)  traZODone (DESYREL) tablet 50 mg (50 mg Oral Given 07/26/21 2216)  piperacillin-tazobactam (ZOSYN) IVPB 3.375 g (3.375 g Intravenous New Bag/Given 07/28/21 0556)  guaiFENesin (ROBITUSSIN) 100 MG/5ML liquid 5 mL (5 mLs Oral Given 07/27/21 1348)  insulin glargine-yfgn (SEMGLEE) injection 20 Units (20 Units Subcutaneous Given 07/28/21 1017)  canagliflozin (INVOKANA) tablet 300 mg (300 mg Oral Given 07/28/21 1029)  sodium chloride 0.9 % bolus 1,000 mL (0 mLs Intravenous Stopped 07/22/21 1129)  iohexol (OMNIPAQUE) 300 MG/ML solution 100 mL (100 mLs Intravenous Contrast Given 07/22/21 1200)  iohexol (OMNIPAQUE) 350 MG/ML injection 75 mL (75 mLs Intravenous Contrast Given 07/22/21 1408)  potassium chloride SA (KLOR-CON) CR tablet 40  mEq (40 mEq Oral Given 07/23/21 2246)  gadobutrol (GADAVIST) 1 MMOL/ML injection 10 mL (10 mLs Intravenous Contrast Given 07/25/21 1914)     Note:  This document was prepared using Dragon voice recognition software and may include unintentional dictation errors.  Nanda Quinton, MD, [facility] Emergency Medicine    Caralina Nop, [facility], MD 07/28/21 1302

## 2021-07-23 DIAGNOSIS — Z23 Encounter for immunization: Secondary | ICD-10-CM | POA: Diagnosis not present

## 2021-07-23 DIAGNOSIS — Z79899 Other long term (current) drug therapy: Secondary | ICD-10-CM | POA: Diagnosis not present

## 2021-07-23 DIAGNOSIS — E1165 Type 2 diabetes mellitus with hyperglycemia: Secondary | ICD-10-CM | POA: Diagnosis present

## 2021-07-23 DIAGNOSIS — E781 Pure hyperglyceridemia: Secondary | ICD-10-CM | POA: Diagnosis present

## 2021-07-23 DIAGNOSIS — R6510 Systemic inflammatory response syndrome (SIRS) of non-infectious origin without acute organ dysfunction: Secondary | ICD-10-CM | POA: Diagnosis present

## 2021-07-23 DIAGNOSIS — E669 Obesity, unspecified: Secondary | ICD-10-CM | POA: Diagnosis present

## 2021-07-23 DIAGNOSIS — Z7985 Long-term (current) use of injectable non-insulin antidiabetic drugs: Secondary | ICD-10-CM | POA: Diagnosis not present

## 2021-07-23 DIAGNOSIS — R748 Abnormal levels of other serum enzymes: Secondary | ICD-10-CM | POA: Diagnosis present

## 2021-07-23 DIAGNOSIS — Z888 Allergy status to other drugs, medicaments and biological substances status: Secondary | ICD-10-CM | POA: Diagnosis not present

## 2021-07-23 DIAGNOSIS — Z833 Family history of diabetes mellitus: Secondary | ICD-10-CM | POA: Diagnosis not present

## 2021-07-23 DIAGNOSIS — Z9049 Acquired absence of other specified parts of digestive tract: Secondary | ICD-10-CM | POA: Diagnosis not present

## 2021-07-23 DIAGNOSIS — E785 Hyperlipidemia, unspecified: Secondary | ICD-10-CM | POA: Diagnosis present

## 2021-07-23 DIAGNOSIS — D61818 Other pancytopenia: Secondary | ICD-10-CM | POA: Diagnosis present

## 2021-07-23 DIAGNOSIS — E860 Dehydration: Secondary | ICD-10-CM | POA: Diagnosis present

## 2021-07-23 DIAGNOSIS — Z794 Long term (current) use of insulin: Secondary | ICD-10-CM | POA: Diagnosis not present

## 2021-07-23 DIAGNOSIS — K219 Gastro-esophageal reflux disease without esophagitis: Secondary | ICD-10-CM | POA: Diagnosis present

## 2021-07-23 DIAGNOSIS — R509 Fever, unspecified: Secondary | ICD-10-CM | POA: Diagnosis present

## 2021-07-23 DIAGNOSIS — Z20822 Contact with and (suspected) exposure to covid-19: Secondary | ICD-10-CM | POA: Diagnosis present

## 2021-07-23 DIAGNOSIS — I10 Essential (primary) hypertension: Secondary | ICD-10-CM | POA: Diagnosis present

## 2021-07-23 DIAGNOSIS — R531 Weakness: Secondary | ICD-10-CM | POA: Diagnosis not present

## 2021-07-23 DIAGNOSIS — Z6831 Body mass index (BMI) 31.0-31.9, adult: Secondary | ICD-10-CM | POA: Diagnosis not present

## 2021-07-23 DIAGNOSIS — F32A Depression, unspecified: Secondary | ICD-10-CM | POA: Diagnosis present

## 2021-07-23 DIAGNOSIS — E871 Hypo-osmolality and hyponatremia: Secondary | ICD-10-CM | POA: Diagnosis present

## 2021-07-23 DIAGNOSIS — K8309 Other cholangitis: Secondary | ICD-10-CM | POA: Diagnosis not present

## 2021-07-23 DIAGNOSIS — Z7984 Long term (current) use of oral hypoglycemic drugs: Secondary | ICD-10-CM | POA: Diagnosis not present

## 2021-07-23 DIAGNOSIS — R700 Elevated erythrocyte sedimentation rate: Secondary | ICD-10-CM | POA: Diagnosis present

## 2021-07-23 LAB — GLUCOSE, CAPILLARY
Glucose-Capillary: 92 mg/dL (ref 70–99)
Glucose-Capillary: 103 mg/dL — ABNORMAL HIGH (ref 70–99)
Glucose-Capillary: 77 mg/dL (ref 70–99)
Glucose-Capillary: 154 mg/dL — ABNORMAL HIGH (ref 70–99)

## 2021-07-23 LAB — CBC
HCT: 32.8 % — ABNORMAL LOW (ref 39.0–52.0)
Hemoglobin: 10.6 g/dL — ABNORMAL LOW (ref 13.0–17.0)
MCH: 27.6 pg (ref 26.0–34.0)
MCHC: 32.3 g/dL (ref 30.0–36.0)
MCV: 85.4 fL (ref 80.0–100.0)
Platelets: 96 10*3/uL — ABNORMAL LOW (ref 150–400)
RBC: 3.84 MIL/uL — ABNORMAL LOW (ref 4.22–5.81)
RDW: 14.4 % (ref 11.5–15.5)
WBC: 4.3 10*3/uL (ref 4.0–10.5)
nRBC: 0 % (ref 0.0–0.2)

## 2021-07-23 LAB — COMPREHENSIVE METABOLIC PANEL
ALT: 45 U/L — ABNORMAL HIGH (ref 0–44)
AST: 43 U/L — ABNORMAL HIGH (ref 15–41)
Albumin: 2.6 g/dL — ABNORMAL LOW (ref 3.5–5.0)
Alkaline Phosphatase: 126 U/L (ref 38–126)
Anion gap: 8 (ref 5–15)
BUN: 11 mg/dL (ref 6–20)
CO2: 21 mmol/L — ABNORMAL LOW (ref 22–32)
Calcium: 8.1 mg/dL — ABNORMAL LOW (ref 8.9–10.3)
Chloride: 102 mmol/L (ref 98–111)
Creatinine, Ser: 0.78 mg/dL (ref 0.61–1.24)
GFR, Estimated: >60 mL/min (ref >60)
Glucose, Bld: 87 mg/dL (ref 70–99)
Potassium: 3.2 mmol/L — ABNORMAL LOW (ref 3.5–5.1)
Sodium: 131 mmol/L — ABNORMAL LOW (ref 135–145)
Total Bilirubin: 1.5 mg/dL — ABNORMAL HIGH (ref 0.3–1.2)
Total Protein: 5.9 g/dL — ABNORMAL LOW (ref 6.5–8.1)

## 2021-07-23 LAB — URINE CULTURE: Culture: NO GROWTH

## 2021-07-23 LAB — PROCALCITONIN: Procalcitonin: 54.64 ng/mL

## 2021-07-23 MED ORDER — KETOROLAC TROMETHAMINE 30 MG/ML IJ SOLN
30.0000 mg | Freq: Once | INTRAMUSCULAR | Status: AC
  Filled 2021-07-23 (×2): qty 1

## 2021-07-23 MED ORDER — TRAZODONE HCL 50 MG PO TABS
100.0000 mg | ORAL_TABLET | Freq: Every day | ORAL | Status: DC
  Administered 2021-07-23 – 2021-07-24 (×2): 100 mg via ORAL
  Filled 2021-07-23 (×2): qty 2

## 2021-07-23 MED ORDER — POTASSIUM CHLORIDE CRYS ER 20 MEQ PO TBCR
40.0000 meq | EXTENDED_RELEASE_TABLET | ORAL | Status: AC
  Administered 2021-07-23 (×3): 40 meq via ORAL
  Filled 2021-07-23 (×3): qty 2

## 2021-07-23 NOTE — Progress Notes (Signed)
   07/23/21 1300  Assess: MEWS Score  Temp (!) 101.3 F (38.5 C)  BP 126/78  Pulse Rate (!) 106  Resp 18  Level of Consciousness Alert  SpO2 97 %  O2 Device Room Air  Assess: MEWS Score  MEWS Temp 1  MEWS Systolic 0  MEWS Pulse 1  MEWS RR 0  MEWS LOC 0  MEWS Score 2  MEWS Score Color Yellow  Assess: if the MEWS score is Yellow or Red  Were vital signs taken at a resting state? Yes  Focused Assessment Change from prior assessment (see assessment flowsheet)  Early Detection of Sepsis Score *See Row Information* Medium  MEWS guidelines implemented *See Row Information* Yes  Notify: Charge Nurse/RN  Name of Charge Nurse/RN Notified Audrea Muscat RN  Date Charge Nurse/RN Notified 07/23/21  Time Charge Nurse/RN Notified 1359  Notify: Provider  Provider Name/Title Dr Dyann Kief  Date Provider Notified 07/23/21  Time Provider Notified 1358  Notification Type Page (secure chat)  Notification Reason Other (Comment) (yellow mews)  Date of Provider Response 07/23/21  Time of Provider Response 1359

## 2021-07-23 NOTE — Progress Notes (Signed)
   07/23/21 2150  Escalate  MEWS: Escalate Yellow: discuss with charge nurse/RN and consider discussing with provider and RRT  Notify: Charge Nurse/RN  Name of Charge Nurse/RN Notified Lawernce Keas, RN  Date Charge Nurse/RN Notified 07/23/21  Time Charge Nurse/RN Notified 2150  Notify: Provider  Provider Name/Title Dr. Clearence Ped  Date Provider Notified 07/23/21  Time Provider Notified 2130  Notification Type Page (secure chat)  Notification Reason Other (Comment) (yellow mews 3 d/t temp)  Provider response No new orders  Date of Provider Response 07/23/21  Time of Provider Response 2140  Gave prn tylenol. Per MD, will give toradol if temp does not decrease

## 2021-07-23 NOTE — Progress Notes (Signed)
PROGRESS NOTE    Omar Travis  MOQ:[id number] DOB: [birth year] DOA: 07/22/2021 PCP: Billie Ruddy, MD    Chief Complaint  Patient presents with   Acute febrile illness/SIRS    Brief Narrative:  Omar Travis is a 55 y.o. male with medical history significant of type 2 diabetes, HTN, HLD, GERD, depression and recent admission due to cholecystitis, choledocholithiasis and cholangitis; who presented to the hospital secondary general malaise, chills and anorexia. Patient reported than since his discharge (6 days ago) he was doing good and progressing, until approx 2 days prior to admission when he noticed chills, general malaise and weakness. Patient reported anorexia and decrease liquid intake. Patient denies CP, nausea, vomiting, dysuria, overt bleeding, focal weakness or any other complaints.   Patient is vaccinated with Moderna, but not booster. ED COVID PCR negative.   ED Course: patient met SIRS criteria on admission (fever, tachycardia and RR), acute febrile illness appreciated, normal WBC's and currently no source of infection found. Cultures taken, empiric antibiotics initiated and TRH consulted to place in the hospital for further evaluation and management. CT abd pelvis w/o acute abnormalities and only changes from recent surgery; CT chest rule out PE and acute cardiopulmonary process. Patient was found dehydrated with increase urine specific gravity     Assessment & Plan: 1-SIRS/acute febrile illness -No source of infection currently found -Elevated ESR, CRP and procalcitonin level -Feeling better after fluid resuscitation. -Continue empirical use of Unasyn -Continue as needed antipyretics.  2-poorly controlled type 2 diabetes on chronic insulin -Holding oral hypoglycemic agents while inpatient -Continue sliding scale insulin and Levemir -Recent A1c 9.2  3-hypertension -Continue to follow vital signs -Blood pressure stable -Continue the use of  lisinopril.  4-gastroesophageal reflux disease -Continue PPI.  5-hyperlipidemia -Continue Crestor.  6-depression -Continue Lexapro.  8-class I obesity -Body mass index is 31.87 kg/m. -Low calorie diet, portion control and increase physical activity discussed with patient.   DVT prophylaxis: Lovenox Code Status: Full code. Family Communication: Wife at bedside. Disposition:   Status is: Inpatient; still spiking fever.  Will require another 24-48 hours prior to be safely discharged.  If fever spike in continues patient may require consultation with infectious disease for FUO.    Consultants:  None  Procedures:  See below for x-ray reports  Antimicrobials:  Unasyn   Subjective: Patient denies chest pain, no nausea, no vomiting.  Still spiking fever.  Reporting feeling less weak after hydration..  Objective: Vitals:   07/23/21 0315 07/23/21 0602 07/23/21 0718 07/23/21 1300  BP: 115/70  112/70 126/78  Pulse: (!) 104  94 (!) 106  Resp: _0 Temp: 99.1 F (37.3 C) 98.3 F (36.8 C) 98.4 F (36.9 C) (!) 101.3 F (38.5 C)  TempSrc: Oral Oral Oral Oral  SpO2: 95%  95% 97%  Weight:      Height:        Intake/Output Summary (Last 24 hours) at 07/23/2021 1403 Last data filed at 07/23/2021 1300 Gross per 24 hour  Intake 2564.88 ml  Output --  Net 2564.88 ml   Filed Weights   07/22/21 0952  Weight: 106.6 kg    Examination:  General exam: In no major distress; no chest pain, no nausea, no vomiting, oriented x3.Still spiking fever.   Respiratory system: Clear to auscultation. Respiratory effort normal.  Good saturation on room air; normal respiratory effort. Cardiovascular system: S1 & S2 heard, RRR. No JVD, murmurs, rubs, gallops or clicks. No pedal edema. Gastrointestinal system:  Abdomen is nondistended, soft and nontender.  Positive bowel sounds; no guarding. Central nervous system: Alert and oriented. No focal neurological deficits. Extremities: No  cyanosis, clubbing or edema. Skin: No petechiae. Psychiatry: Judgement and insight appear normal. Mood & affect appropriate.    Data Reviewed: I have personally reviewed following labs and imaging studies  CBC: Recent Labs  Lab 07/17/21 0250 07/22/21 1048 07/23/21 0613  WBC 4.5 7.4 4.3  NEUTROABS  --  6.8  --   HGB 12.2* 10.7* 10.6*  HCT 37.9* 33.4* 32.8*  MCV 85.4 85.9 85.4  PLT 132* 120* 96*    Basic Metabolic Panel: Recent Labs  Lab 07/17/21 0250 07/22/21 1048 07/23/21 0613  NA 136 131* 131*  K 3.8 3.3* 3.2*  CL 102 102 102  CO2 25 19* 21*  GLUCOSE 107* 123* 87  BUN _0 CREATININE 0.87 0.91 0.78  CALCIUM 8.6* 7.8* 8.1*    GFR: Estimated Creatinine Clearance: 136.3 mL/min (by C-G formula based on SCr of 0.78 mg/dL).  Liver Function Tests: Recent Labs  Lab 07/17/21 0250 07/22/21 1048 07/23/21 0613  AST 36 28 43*  ALT 115* 44 45*  ALKPHOS 108 115 126  BILITOT 1.8* 1.6* 1.5*  PROT 5.6* 5.7* 5.9*  ALBUMIN 2.8* 2.6* 2.6*    CBG: Recent Labs  Lab 07/17/21 1222 07/17/21 1655 07/22/21 2020 07/23/21 0932 07/23/21 1108  GLUCAP 121* 207* 162* 92 103*     Recent Results (from the past 240 hour(s))  Resp Panel by RT-PCR (Flu A&B, Covid) Nasopharyngeal Swab     Status: None   Collection Time: 07/14/21 10:30 PM   Specimen: Nasopharyngeal Swab; Nasopharyngeal(NP) swabs in vial transport medium  Result Value Ref Range Status   SARS Coronavirus 2 by RT PCR NEGATIVE NEGATIVE Final    Comment: (NOTE) SARS-CoV-2 target nucleic acids are NOT DETECTED.  The SARS-CoV-2 RNA is generally detectable in upper respiratory specimens during the acute phase of infection. The lowest concentration of SARS-CoV-2 viral copies this assay can detect is 138 copies/mL. A negative result does not preclude SARS-Cov-2 infection and should not be used as the sole basis for treatment or other patient management decisions. A negative result may occur with  improper specimen  collection/handling, submission of specimen other than nasopharyngeal swab, presence of viral mutation(s) within the areas targeted by this assay, and inadequate number of viral copies(<138 copies/mL). A negative result must be combined with clinical observations, patient history, and epidemiological information. The expected result is Negative.  Fact Sheet for Patients:  EntrepreneurPulse.com.au  Fact Sheet for Healthcare Providers:  IncredibleEmployment.be  This test is no t yet approved or cleared by the [place] FDA and  has been authorized for detection and/or diagnosis of SARS-CoV-2 by FDA under an Emergency Use Authorization (EUA). This EUA will remain  in effect (meaning this test can be used) for the duration of the COVID-19 declaration under Section 564(b)(1) of the Act, 21 U.S.C.section 360bbb-3(b)(1), unless the authorization is terminated  or revoked sooner.       Influenza A by PCR NEGATIVE NEGATIVE Final   Influenza B by PCR NEGATIVE NEGATIVE Final    Comment: (NOTE) The Xpert Xpress SARS-CoV-2/FLU/RSV plus assay is intended as an aid in the diagnosis of influenza from Nasopharyngeal swab specimens and should not be used as a sole basis for treatment. Nasal washings and aspirates are unacceptable for Xpert Xpress SARS-CoV-2/FLU/RSV testing.  Fact Sheet for Patients: EntrepreneurPulse.com.au  Fact Sheet for Healthcare Providers: IncredibleEmployment.be  This  test is not yet approved or cleared by the [place] and has been authorized for detection and/or diagnosis of SARS-CoV-2 by FDA under an Emergency Use Authorization (EUA). This EUA will remain in effect (meaning this test can be used) for the duration of the COVID-19 declaration under Section 564(b)(1) of the Act, 21 U.S.C. section 360bbb-3(b)(1), unless the authorization is terminated or revoked.  Performed at [facility], [place] [address], [place], [place] [address]   Surgical pcr screen     Status: Abnormal   Collection Time: 07/16/21 11:35 PM   Specimen: Nasal Mucosa; Nasal Swab  Result Value Ref Range Status   MRSA, PCR NEGATIVE NEGATIVE Final   Staphylococcus aureus POSITIVE (A) NEGATIVE Final    Comment: (NOTE) The Xpert SA Assay (FDA approved for NASAL specimens in patients 31 years of age and older), is one component of a comprehensive surveillance program. It is not intended to diagnose infection nor to guide or monitor treatment. Performed at [facility], [place] [address], [place], [place] [address]   Blood Culture (routine x 2)     Status: None (Preliminary result)   Collection Time: 07/22/21 10:49 AM   Specimen: BLOOD RIGHT HAND  Result Value Ref Range Status   Specimen Description BLOOD RIGHT HAND  Final   Special Requests   Final    BOTTLES DRAWN AEROBIC AND ANAEROBIC Blood Culture adequate volume   Culture   Final    NO GROWTH 1 DAY Performed at [facility], [address] [place], [place] [address]    Report Status PENDING  Incomplete  Blood Culture (routine x 2)     Status: None (Preliminary result)   Collection Time: 07/22/21 10:55 AM   Specimen: BLOOD LEFT HAND  Result Value Ref Range Status   Specimen Description BLOOD LEFT HAND  Final   Special Requests   Final    BOTTLES DRAWN AEROBIC AND ANAEROBIC Blood Culture results may not be optimal due to an excessive volume of blood received in culture bottles   Culture   Final    NO GROWTH 1 DAY Performed at [facility], [address] [place], [place] [address]    Report Status PENDING  Incomplete  Resp Panel by RT-PCR (Flu A&B, Covid) Nasopharyngeal Swab     Status: None   Collection Time: 07/22/21 11:04 AM   Specimen: Nasopharyngeal Swab; Nasopharyngeal(NP) swabs in vial transport medium  Result Value Ref Range Status   SARS Coronavirus 2 by RT PCR NEGATIVE NEGATIVE Final    Comment: (NOTE) SARS-CoV-2  target nucleic acids are NOT DETECTED.  The SARS-CoV-2 RNA is generally detectable in upper respiratory specimens during the acute phase of infection. The lowest concentration of SARS-CoV-2 viral copies this assay can detect is 138 copies/mL. A negative result does not preclude SARS-Cov-2 infection and should not be used as the sole basis for treatment or other patient management decisions. A negative result may occur with  improper specimen collection/handling, submission of specimen other than nasopharyngeal swab, presence of viral mutation(s) within the areas targeted by this assay, and inadequate number of viral copies(<138 copies/mL). A negative result must be combined with clinical observations, patient history, and epidemiological information. The expected result is Negative.  Fact Sheet for Patients:  EntrepreneurPulse.com.au  Fact Sheet for Healthcare Providers:  IncredibleEmployment.be  This test is no t yet approved or cleared by the [place] FDA and  has been authorized for detection and/or diagnosis of SARS-CoV-2 by FDA  under an Emergency Use Authorization (EUA). This EUA will remain  in effect (meaning this test can be used) for the duration of the COVID-19 declaration under Section 564(b)(1) of the Act, 21 U.S.C.section 360bbb-3(b)(1), unless the authorization is terminated  or revoked sooner.       Influenza A by PCR NEGATIVE NEGATIVE Final   Influenza B by PCR NEGATIVE NEGATIVE Final    Comment: (NOTE) The Xpert Xpress SARS-CoV-2/FLU/RSV plus assay is intended as an aid in the diagnosis of influenza from Nasopharyngeal swab specimens and should not be used as a sole basis for treatment. Nasal washings and aspirates are unacceptable for Xpert Xpress SARS-CoV-2/FLU/RSV testing.  Fact Sheet for Patients: EntrepreneurPulse.com.au  Fact Sheet for Healthcare  Providers: IncredibleEmployment.be  This test is not yet approved or cleared by the [place] FDA and has been authorized for detection and/or diagnosis of SARS-CoV-2 by FDA under an Emergency Use Authorization (EUA). This EUA will remain in effect (meaning this test can be used) for the duration of the COVID-19 declaration under Section 564(b)(1) of the Act, 21 U.S.C. section 360bbb-3(b)(1), unless the authorization is terminated or revoked.  Performed at [facility], [address] [place], [place] [address]   Urine Culture     Status: None   Collection Time: 07/22/21  1:21 PM   Specimen: In/Out Cath Urine  Result Value Ref Range Status   Specimen Description   Final    IN/OUT CATH URINE Performed at [facility], [address], [place], [place] [address]    Special Requests   Final    NONE Performed at [facility], [address], [place], [place] [address]    Culture   Final    NO GROWTH Performed at [facility], [place] [address] [place], [place] [address]    Report Status 07/23/2021 FINAL  Final     Radiology Studies: CT Angio Chest PE W and/or Wo Contrast  Result Date: 07/22/2021 CLINICAL DATA:  Diaphoresis, fever, recent gallbladder surgery, code sepsis, high clinical suspicion of pulmonary embolism EXAM: CT ANGIOGRAPHY CHEST WITH CONTRAST TECHNIQUE: Multidetector CT imaging of the chest was performed using the standard protocol during bolus administration of intravenous contrast. Multiplanar CT image reconstructions and MIPs were obtained to evaluate the vascular anatomy. CONTRAST:  63mL OMNIPAQUE IOHEXOL 350 MG/ML SOLN IV COMPARISON:  None FINDINGS: Cardiovascular: Heart unremarkable. No pericardial effusion. Aorta normal caliber without aneurysm or dissection. Pulmonary arteries adequately opacified. Scattered respiratory motion artifacts in lower lobes. No pulmonary emboli identified. Mediastinum/Nodes: Esophagus unremarkable. No thoracic  adenopathy. Base of cervical region normal appearance. Lungs/Pleura: Minimal dependent atelectasis posterior lower lobes. Lungs otherwise clear. No pulmonary infiltrate, pleural effusion, or pneumothorax. 2 mm RIGHT lower lobe nodule image 68. Questionable tiny nodule versus scar 3 mm diameter RIGHT middle lobe image 65. 3 mm subpleural nodular density LEFT lower lobe image 105. Upper Abdomen: Chronic atrophy and intrahepatic bilary dilatation RIGHT lobe liver medially unchanged from prior exams. Few foci of gas at porta hepatis, likely external to biliary tree, suspect related to recent ERCP. Minimal bilary air LEFT lobe liver post ERCP. No other upper abdominal free air. Upper normal splenic size. Musculoskeletal: Unremarkable. Review of the MIP images confirms the above findings. IMPRESSION: No evidence of pulmonary embolism. No definite acute intrathoracic abnormalities. Few foci of gas at porta hepatis, suspect related to recent ERCP. Few tiny lung nodules; no follow-up needed if patient is low-risk (and has no known or suspected primary neoplasm). Non-contrast chest CT can  be considered in 12 months if patient is high-risk. This recommendation follows the consensus statement: Guidelines for Management of Incidental Pulmonary Nodules Detected on CT Images: From the Fleischner Society 2017; Radiology 2017; 284:228-243. Aortic Atherosclerosis (ICD10-I70.0). Findings discussed with Dr. Laverta Baltimore on 07/22/2021 at 1455 hours. Electronically Signed   By: Lavonia Dana M.D.   On: 07/22/2021 14:57   CT ABDOMEN PELVIS W CONTRAST  Result Date: 07/22/2021 CLINICAL DATA:  Fever. ERCP and cholecystectomy 5/6 days ago. Recent therapy of antibiotics. Chills. Nausea. Diabetes. Prior pancreatic stent. Sphincterotomy. EXAM: CT ABDOMEN AND PELVIS WITH CONTRAST TECHNIQUE: Multidetector CT imaging of the abdomen and pelvis was performed using the standard protocol following bolus administration of intravenous contrast. CONTRAST:   147m OMNIPAQUE IOHEXOL 300 MG/ML  SOLN COMPARISON:  07/14/2021 abdominopelvic CT.  MRI of 07/14/2021 FINDINGS: Lower chest: 3 mm left lower lobe pulmonary nodule on 07/05 similar to on the recent exam and not described on the report of 04/28/2014. Images not available. Normal heart size without pericardial or pleural effusion. Hepatobiliary: Subtly irregular hepatic capsule with prominence of the lateral segment left lobe. Chronic marked biliary duct dilatation involving segment 6 and the caudate lobe. Trace pneumobilia within the left hepatic lobe which is likely iatrogenic. Interval cholecystectomy. No common duct dilatation or evidence of choledocholithiasis. No fluid collection within the operative bed. Mild pericholecystic edema and trace air are expected in the postoperative setting. Pancreas: Normal, without mass or ductal dilatation. Spleen: Splenomegaly at 15.6 cm craniocaudal. Maximal transverse 12.8 x 6.6 cm. (volume = 690). Adrenals/Urinary Tract: Normal adrenal glands. Left greater than right renal sinus cysts. No hydronephrosis. Normal urinary bladder. Stomach/Bowel: Normal stomach, without wall thickening. Nonspecific submucosal fat prominence involving the ascending colon. Normal terminal ileum and appendix. Normal small bowel. Vascular/Lymphatic: Normal caliber of the aorta and branch vessels. Patent portal and splenic veins. Mildly prominent veins in the splenic hilum. No abdominopelvic adenopathy. small nodes within the jejunal mesentery with increased density in the mesenteric fat on 44/2. No pelvic sidewall adenopathy. Reproductive: Normal prostate. Other: No free intraperitoneal air.  No free fluid. Musculoskeletal: No acute osseous abnormality. IMPRESSION: 1. Expected appearance after cholecystectomy, without postoperative fluid collection, common duct dilatation, or choledocholithiasis. 2. Chronic marked intrahepatic biliary duct dilatation involving the right and caudate lobes, similar back  to 2018 and by report back to 2015. This has been evaluated on multiple prior imaging studies, possibly the sequelae of remote insult. 3. Suspicion of mild cirrhosis with splenomegaly and possible portal venous hypertension. 4. Jejunal mesenteric findings which suggest adenitis/panniculitis, of indeterminate clinical significance. Can be seen in asymptomatic individuals. Present back to 08/19/2017. 5. 3 mm left lower lobe pulmonary nodule. No follow-up needed if patient is low-risk. Non-contrast chest CT can be considered in 12 months if patient is high-risk. This recommendation follows the consensus statement: Guidelines for Management of Incidental Pulmonary Nodules Detected on CT Images: From the Fleischner Society 2017; Radiology 2017; 284:228-243. Electronically Signed   By: KAbigail MiyamotoM.D.   On: 07/22/2021 12:36   DG Chest Port 1 View  Result Date: 07/22/2021 CLINICAL DATA:  55year old male with possible sepsis. EXAM: PORTABLE CHEST 1 VIEW COMPARISON:  No priors. FINDINGS: Lung volumes are low. No consolidative airspace disease. No pleural effusions. No pneumothorax. No pulmonary nodule or mass noted. Pulmonary vasculature and the cardiomediastinal silhouette are within normal limits. IMPRESSION: 1. Low lung volumes without radiographic evidence of acute cardiopulmonary disease. Electronically Signed   By: DVinnie LangtonM.D.   On: 07/22/2021  10:22     Scheduled Meds:  enoxaparin (LOVENOX) injection  40 mg Subcutaneous Q24H   escitalopram  10 mg Oral Daily   influenza vac split quadrivalent PF  0.5 mL Intramuscular Tomorrow-1000   insulin aspart  0-5 Units Subcutaneous QHS   insulin aspart  0-9 Units Subcutaneous TID WC   ketorolac  30 mg Intravenous Once   lisinopril  5 mg Oral Daily   pantoprazole  40 mg Oral Daily   rosuvastatin  5 mg Oral Daily   Continuous Infusions:  sodium chloride 100 mL/hr at 07/23/21 0603   ampicillin-sulbactam (UNASYN) IV 3 g (07/23/21 1224)     LOS: 0  days    Time spent: 35 minutes.    Barton Dubois, MD [facility]   To contact the attending provider between 7A-7P or the covering provider during after hours 7P-7A, please log into the web site [email or web address] and access using universal  password for that web site. If you do not have the password, please call the hospital operator.  07/23/2021, 2:03 PM

## 2021-07-24 DIAGNOSIS — R6510 Systemic inflammatory response syndrome (SIRS) of non-infectious origin without acute organ dysfunction: Secondary | ICD-10-CM

## 2021-07-24 DIAGNOSIS — D61818 Other pancytopenia: Secondary | ICD-10-CM

## 2021-07-24 DIAGNOSIS — R509 Fever, unspecified: Secondary | ICD-10-CM

## 2021-07-24 LAB — URINALYSIS, COMPLETE (UACMP) WITH MICROSCOPIC
Bacteria, UA: NONE SEEN
Bilirubin Urine: NEGATIVE
Glucose, UA: >=500 mg/dL — AB
Ketones, ur: NEGATIVE mg/dL
Leukocytes,Ua: NEGATIVE
Nitrite: NEGATIVE
Protein, ur: NEGATIVE mg/dL
Specific Gravity, Urine: 1.006 (ref 1.005–1.030)
pH: 6 (ref 5.0–8.0)

## 2021-07-24 LAB — CBC
HCT: 31 % — ABNORMAL LOW (ref 39.0–52.0)
Hemoglobin: 9.8 g/dL — ABNORMAL LOW (ref 13.0–17.0)
MCH: 27.5 pg (ref 26.0–34.0)
MCHC: 31.6 g/dL (ref 30.0–36.0)
MCV: 86.8 fL (ref 80.0–100.0)
Platelets: 90 10*3/uL — ABNORMAL LOW (ref 150–400)
RBC: 3.57 MIL/uL — ABNORMAL LOW (ref 4.22–5.81)
RDW: 14.5 % (ref 11.5–15.5)
WBC: 3.4 10*3/uL — ABNORMAL LOW (ref 4.0–10.5)
nRBC: 0 % (ref 0.0–0.2)

## 2021-07-24 LAB — BASIC METABOLIC PANEL
Anion gap: 4 — ABNORMAL LOW (ref 5–15)
BUN: 9 mg/dL (ref 6–20)
CO2: 24 mmol/L (ref 22–32)
Calcium: 8 mg/dL — ABNORMAL LOW (ref 8.9–10.3)
Chloride: 105 mmol/L (ref 98–111)
Creatinine, Ser: 0.76 mg/dL (ref 0.61–1.24)
GFR, Estimated: >60 mL/min (ref >60)
Glucose, Bld: 114 mg/dL — ABNORMAL HIGH (ref 70–99)
Potassium: 4.1 mmol/L (ref 3.5–5.1)
Sodium: 133 mmol/L — ABNORMAL LOW (ref 135–145)

## 2021-07-24 LAB — GLUCOSE, CAPILLARY
Glucose-Capillary: 88 mg/dL (ref 70–99)
Glucose-Capillary: 157 mg/dL — ABNORMAL HIGH (ref 70–99)
Glucose-Capillary: 139 mg/dL — ABNORMAL HIGH (ref 70–99)
Glucose-Capillary: 182 mg/dL — ABNORMAL HIGH (ref 70–99)

## 2021-07-24 LAB — PROCALCITONIN: Procalcitonin: 34.89 ng/mL

## 2021-07-24 MED ORDER — SALINE SPRAY 0.65 % NA SOLN
1.0000 | NASAL | Status: DC | PRN
  Administered 2021-07-24: 1 via NASAL
  Filled 2021-07-24: qty 44

## 2021-07-24 MED ORDER — LORATADINE 10 MG PO TABS
10.0000 mg | ORAL_TABLET | Freq: Every day | ORAL | Status: DC
  Administered 2021-07-24 – 2021-07-30 (×7): 10 mg via ORAL
  Filled 2021-07-24 (×7): qty 1

## 2021-07-24 NOTE — Progress Notes (Addendum)
PROGRESS NOTE  Omar Travis YQM:[id number] DOB: 1966/04/19 DOA: 07/22/2021 PCP: Billie Ruddy, MD  Brief History:  55 y.o. male with medical history significant of type 2 diabetes, HTN, HLD, GERD, depression and recent admission due to cholecystitis, choledocholithiasis and cholangitis; who presented to the hospital secondary general malaise, chills and anorexia. Patient reported than since his discharge (6 days ago) he was doing good and progressing, until approx 2 days prior to admission when he noticed chills, general malaise and weakness. Patient reported anorexia and decrease liquid intake. Patient denies CP, nausea, vomiting, dysuria, overt bleeding, focal weakness or any other complaints.   Patient is vaccinated with Moderna, but not booster. ED COVID PCR negative.   ED Course: patient met SIRS criteria on admission (fever, tachycardia and RR), acute febrile illness appreciated, normal WBC's and currently no source of infection found. Cultures taken, empiric antibiotics initiated and TRH consulted to place in the hospital for further evaluation and management. CT abd pelvis w/o acute abnormalities and only changes from recent surgery; CT chest rule out PE and acute cardiopulmonary process. Patient was found dehydrated with increase urine specific gravity, but no pyuria.  Patient was started on empiric unasyn.  Blood cultures and CT's of chest and abd have been unremarkable.  He continues to have fevers 102 to 103.  As a result, an ID opinion will be obtained and patient to be transferred to [facility] for this.  Assessment/Plan: SIRS/fever unknown origin -CTA chest--no PE, no infiltrates -CT abd--mild pericholecystic edema and trace air as expected in porta hepatis area post op -blood cultures neg -LFTs improving -no abd pain, no diarrhea -UA--no pyuria -concerned about fungemia vs non-infectious fever -transfer to [facility] for ID opinion--spoke with Dr. Tommy Medal  S/p  cholecystectomy -had lap chole 10/26 -s/p ERCP with sphincterotomy and stent on 01/25 -no abd pain presently  Pancytopenia -?due to infection -check tickborne serology   poorly controlled type 2 diabetes on chronic insulin -Holding oral hypoglycemic agents while inpatient -Continue sliding scale insulin and Levemir -Recent A1c 9.2   hypertension -Continue to follow vital signs -Blood pressure stable -Continue the use of lisinopril.   gastroesophageal reflux disease -Continue PPI.   hyperlipidemia -Continue Crestor.   depression -Continue Lexapro.   class I obesity -Body mass index is 31.87 kg/m. -Low calorie diet, portion control and increase physical activity discussed with patient.    Status is: Inpatient  Remains inpatient appropriate because: persistent fever needing additional inpatient work up        Family Communication:   spouse updated at bedside 11/2  Consultants:  ID  Code Status:  FULL   DVT Prophylaxis:  SCDs  Procedures: As Listed in Progress Note Above  Antibiotics: Unasyn 10/31>>   Total time spent 35 minutes.  Greater than 50% spent face to face counseling and coordinating care.    Subjective:  Patient denies fevers, chills, headache, chest pain, dyspnea, nausea, vomiting, diarrhea, abdominal pain, dysuria, hematuria, hematochezia, and melena.  Objective: Vitals:   07/23/21 2335 07/24/21 0458 07/24/21 0514 07/24/21 0900  BP: 117/74  103/69   Pulse: (!) 106  100   Resp: 19  19   Temp: 99.2 F (37.3 C) 99.1 F (37.3 C) 100 F (37.8 C) (!) 100.4 F (38 C)  TempSrc: Oral Oral Oral Oral  SpO2: 92%  94%   Weight:      Height:        Intake/Output Summary (  Last 24 hours) at 07/24/2021 1150 Last data filed at 07/24/2021 0834 Gross per 24 hour  Intake 1360 ml  Output --  Net 1360 ml   Weight change:  Exam:  General:  Pt is alert, follows commands appropriately, not in acute distress HEENT: No icterus, No thrush, No  neck mass, [place]/AT Cardiovascular: RRR, S1/S2, no rubs, no gallops Respiratory: CTA bilaterally, no wheezing, no crackles, no rhonchi Abdomen: Soft/+BS, non tender, non distended, no guarding Extremities: No edema, No lymphangitis, No petechiae, No rashes, no synovitis   Data Reviewed: I have personally reviewed following labs and imaging studies Basic Metabolic Panel: Recent Labs  Lab 07/22/21 1048 07/23/21 0613 07/24/21 0455  NA 131* 131* 133*  K 3.3* 3.2* 4.1  CL 102 102 105  CO2 19* 21* 24  GLUCOSE 123* 87 114*  BUN _0 CREATININE 0.91 0.78 0.76  CALCIUM 7.8* 8.1* 8.0*   Liver Function Tests: Recent Labs  Lab 07/22/21 1048 07/23/21 0613  AST 28 43*  ALT 44 45*  ALKPHOS 115 126  BILITOT 1.6* 1.5*  PROT 5.7* 5.9*  ALBUMIN 2.6* 2.6*   No results for input(s): LIPASE, AMYLASE in the last 168 hours. No results for input(s): AMMONIA in the last 168 hours. Coagulation Profile: Recent Labs  Lab 07/22/21 1048  INR 1.3*   CBC: Recent Labs  Lab 07/22/21 1048 07/23/21 0613 07/24/21 0455  WBC 7.4 4.3 3.4*  NEUTROABS 6.8  --   --   HGB 10.7* 10.6* 9.8*  HCT 33.4* 32.8* 31.0*  MCV 85.9 85.4 86.8  PLT 120* 96* 90*   Cardiac Enzymes: No results for input(s): CKTOTAL, CKMB, CKMBINDEX, TROPONINI in the last 168 hours. BNP: Invalid input(s): POCBNP CBG: Recent Labs  Lab 07/23/21 1108 07/23/21 1648 07/23/21 2123 07/24/21 0744 07/24/21 1138  GLUCAP 103* 77 154* 88 157*   HbA1C: No results for input(s): HGBA1C in the last 72 hours. Urine analysis:    Component Value Date/Time   COLORURINE YELLOW 07/22/2021 [place] 07/22/2021 1321   LABSPEC 1.036 (H) 07/22/2021 1321   PHURINE 5.0 07/22/2021 1321   GLUCOSEU >=500 (A) 07/22/2021 1321   HGBUR SMALL (A) 07/22/2021 1321   BILIRUBINUR NEGATIVE 07/22/2021 1321   KETONESUR 20 (A) 07/22/2021 1321   PROTEINUR NEGATIVE 07/22/2021 1321   NITRITE NEGATIVE 07/22/2021 1321   LEUKOCYTESUR  NEGATIVE 07/22/2021 1321   Sepsis Labs: _1 (procalcitonin:4,lacticidven:4) ) Recent Results (from the past 240 hour(s))  Resp Panel by RT-PCR (Flu A&B, Covid) Nasopharyngeal Swab     Status: None   Collection Time: 07/14/21 10:30 PM   Specimen: Nasopharyngeal Swab; Nasopharyngeal(NP) swabs in vial transport medium  Result Value Ref Range Status   SARS Coronavirus 2 by RT PCR NEGATIVE NEGATIVE Final    Comment: (NOTE) SARS-CoV-2 target nucleic acids are NOT DETECTED.  The SARS-CoV-2 RNA is generally detectable in upper respiratory specimens during the acute phase of infection. The lowest concentration of SARS-CoV-2 viral copies this assay can detect is 138 copies/mL. A negative result does not preclude SARS-Cov-2 infection and should not be used as the sole basis for treatment or other patient management decisions. A negative result may occur with  improper specimen collection/handling, submission of specimen other than nasopharyngeal swab, presence of viral mutation(s) within the areas targeted by this assay, and inadequate number of viral copies(<138 copies/mL). A negative result must be combined with clinical observations, patient history, and epidemiological information. The expected result is Negative.  Fact Sheet for Patients:  EntrepreneurPulse.com.au  Fact Sheet for Healthcare Providers:  IncredibleEmployment.be  This test is no t yet approved or cleared by the [place] FDA and  has been authorized for detection and/or diagnosis of SARS-CoV-2 by FDA under an Emergency Use Authorization (EUA). This EUA will remain  in effect (meaning this test can be used) for the duration of the COVID-19 declaration under Section 564(b)(1) of the Act, 21 U.S.C.section 360bbb-3(b)(1), unless the authorization is terminated  or revoked sooner.       Influenza A by PCR NEGATIVE NEGATIVE Final   Influenza B by PCR NEGATIVE NEGATIVE Final     Comment: (NOTE) The Xpert Xpress SARS-CoV-2/FLU/RSV plus assay is intended as an aid in the diagnosis of influenza from Nasopharyngeal swab specimens and should not be used as a sole basis for treatment. Nasal washings and aspirates are unacceptable for Xpert Xpress SARS-CoV-2/FLU/RSV testing.  Fact Sheet for Patients: EntrepreneurPulse.com.au  Fact Sheet for Healthcare Providers: IncredibleEmployment.be  This test is not yet approved or cleared by the [place] FDA and has been authorized for detection and/or diagnosis of SARS-CoV-2 by FDA under an Emergency Use Authorization (EUA). This EUA will remain in effect (meaning this test can be used) for the duration of the COVID-19 declaration under Section 564(b)(1) of the Act, 21 U.S.C. section 360bbb-3(b)(1), unless the authorization is terminated or revoked.  Performed at [facility], [place] [address] [place], [place] [address]   Surgical pcr screen     Status: Abnormal   Collection Time: 07/16/21 11:35 PM   Specimen: Nasal Mucosa; Nasal Swab  Result Value Ref Range Status   MRSA, PCR NEGATIVE NEGATIVE Final   Staphylococcus aureus POSITIVE (A) NEGATIVE Final    Comment: (NOTE) The Xpert SA Assay (FDA approved for NASAL specimens in patients 48 years of age and older), is one component of a comprehensive surveillance program. It is not intended to diagnose infection nor to guide or monitor treatment. Performed at [facility], [place] [address] [place], [place] [address]   Blood Culture (routine x 2)     Status: None (Preliminary result)   Collection Time: 07/22/21 10:49 AM   Specimen: BLOOD RIGHT HAND  Result Value Ref Range Status   Specimen Description BLOOD RIGHT HAND  Final   Special Requests   Final    BOTTLES DRAWN AEROBIC AND ANAEROBIC Blood Culture adequate volume   Culture   Final    NO GROWTH 2 DAYS Performed at [facility], [address] [place],  [place] [address]    Report Status PENDING  Incomplete  Blood Culture (routine x 2)     Status: None (Preliminary result)   Collection Time: 07/22/21 10:55 AM   Specimen: BLOOD LEFT HAND  Result Value Ref Range Status   Specimen Description BLOOD LEFT HAND  Final   Special Requests   Final    BOTTLES DRAWN AEROBIC AND ANAEROBIC Blood Culture results may not be optimal due to an excessive volume of blood received in culture bottles   Culture   Final    NO GROWTH 2 DAYS Performed at [facility], [address], [place], [place] [address]    Report Status PENDING  Incomplete  Resp Panel by RT-PCR (Flu A&B, Covid) Nasopharyngeal Swab     Status: None   Collection Time: 07/22/21 11:04 AM   Specimen: Nasopharyngeal Swab; Nasopharyngeal(NP) swabs in vial transport medium  Result Value Ref Range Status   SARS Coronavirus 2 by RT PCR NEGATIVE NEGATIVE  Final    Comment: (NOTE) SARS-CoV-2 target nucleic acids are NOT DETECTED.  The SARS-CoV-2 RNA is generally detectable in upper respiratory specimens during the acute phase of infection. The lowest concentration of SARS-CoV-2 viral copies this assay can detect is 138 copies/mL. A negative result does not preclude SARS-Cov-2 infection and should not be used as the sole basis for treatment or other patient management decisions. A negative result may occur with  improper specimen collection/handling, submission of specimen other than nasopharyngeal swab, presence of viral mutation(s) within the areas targeted by this assay, and inadequate number of viral copies(<138 copies/mL). A negative result must be combined with clinical observations, patient history, and epidemiological information. The expected result is Negative.  Fact Sheet for Patients:  EntrepreneurPulse.com.au  Fact Sheet for Healthcare Providers:  IncredibleEmployment.be  This test is no t yet approved or cleared by the [place] FDA and  has  been authorized for detection and/or diagnosis of SARS-CoV-2 by FDA under an Emergency Use Authorization (EUA). This EUA will remain  in effect (meaning this test can be used) for the duration of the COVID-19 declaration under Section 564(b)(1) of the Act, 21 U.S.C.section 360bbb-3(b)(1), unless the authorization is terminated  or revoked sooner.       Influenza A by PCR NEGATIVE NEGATIVE Final   Influenza B by PCR NEGATIVE NEGATIVE Final    Comment: (NOTE) The Xpert Xpress SARS-CoV-2/FLU/RSV plus assay is intended as an aid in the diagnosis of influenza from Nasopharyngeal swab specimens and should not be used as a sole basis for treatment. Nasal washings and aspirates are unacceptable for Xpert Xpress SARS-CoV-2/FLU/RSV testing.  Fact Sheet for Patients: EntrepreneurPulse.com.au  Fact Sheet for Healthcare Providers: IncredibleEmployment.be  This test is not yet approved or cleared by the [place] FDA and has been authorized for detection and/or diagnosis of SARS-CoV-2 by FDA under an Emergency Use Authorization (EUA). This EUA will remain in effect (meaning this test can be used) for the duration of the COVID-19 declaration under Section 564(b)(1) of the Act, 21 U.S.C. section 360bbb-3(b)(1), unless the authorization is terminated or revoked.  Performed at [facility], [address], [place], [place] [address]   Urine Culture     Status: None   Collection Time: 07/22/21  1:21 PM   Specimen: In/Out Cath Urine  Result Value Ref Range Status   Specimen Description   Final    IN/OUT CATH URINE Performed at [facility], [address] [place], [place] [address]    Special Requests   Final    NONE Performed at [facility], [address] [place], [place] [address]    Culture   Final    NO GROWTH Performed at [facility], [place] [address] [place], [place] [address]    Report Status 07/23/2021 FINAL  Final     Scheduled  Meds:  enoxaparin (LOVENOX) injection  40 mg Subcutaneous Q24H   escitalopram  10 mg Oral Daily   influenza vac split quadrivalent PF  0.5 mL Intramuscular Tomorrow-1000   insulin aspart  0-5 Units Subcutaneous QHS   insulin aspart  0-9 Units Subcutaneous TID WC   ketorolac  30 mg Intravenous Once   lisinopril  5 mg Oral Daily   pantoprazole  40 mg Oral Daily   rosuvastatin  5 mg Oral Daily   traZODone  100 mg Oral QHS   Continuous Infusions:  sodium chloride 100 mL/hr at 07/24/21 0457   ampicillin-sulbactam (UNASYN) IV 3 g (07/24/21 0458)  Procedures/Studies: CT Angio Chest PE W and/or Wo Contrast  Result Date: 07/22/2021 CLINICAL DATA:  Diaphoresis, fever, recent gallbladder surgery, code sepsis, high clinical suspicion of pulmonary embolism EXAM: CT ANGIOGRAPHY CHEST WITH CONTRAST TECHNIQUE: Multidetector CT imaging of the chest was performed using the standard protocol during bolus administration of intravenous contrast. Multiplanar CT image reconstructions and MIPs were obtained to evaluate the vascular anatomy. CONTRAST:  21m OMNIPAQUE IOHEXOL 350 MG/ML SOLN IV COMPARISON:  None FINDINGS: Cardiovascular: Heart unremarkable. No pericardial effusion. Aorta normal caliber without aneurysm or dissection. Pulmonary arteries adequately opacified. Scattered respiratory motion artifacts in lower lobes. No pulmonary emboli identified. Mediastinum/Nodes: Esophagus unremarkable. No thoracic adenopathy. Base of cervical region normal appearance. Lungs/Pleura: Minimal dependent atelectasis posterior lower lobes. Lungs otherwise clear. No pulmonary infiltrate, pleural effusion, or pneumothorax. 2 mm RIGHT lower lobe nodule image 68. Questionable tiny nodule versus scar 3 mm diameter RIGHT middle lobe image 65. 3 mm subpleural nodular density LEFT lower lobe image 105. Upper Abdomen: Chronic atrophy and intrahepatic bilary dilatation RIGHT lobe liver medially unchanged from prior exams. Few foci of  gas at porta hepatis, likely external to biliary tree, suspect related to recent ERCP. Minimal bilary air LEFT lobe liver post ERCP. No other upper abdominal free air. Upper normal splenic size. Musculoskeletal: Unremarkable. Review of the MIP images confirms the above findings. IMPRESSION: No evidence of pulmonary embolism. No definite acute intrathoracic abnormalities. Few foci of gas at porta hepatis, suspect related to recent ERCP. Few tiny lung nodules; no follow-up needed if patient is low-risk (and has no known or suspected primary neoplasm). Non-contrast chest CT can be considered in 12 months if patient is high-risk. This recommendation follows the consensus statement: Guidelines for Management of Incidental Pulmonary Nodules Detected on CT Images: From the Fleischner Society 2017; Radiology 2017; 284:228-243. Aortic Atherosclerosis (ICD10-I70.0). Findings discussed with Dr. LLaverta Baltimoreon 07/22/2021 at 1455 hours. Electronically Signed   By: MLavonia DanaM.D.   On: 07/22/2021 14:57   CT ABDOMEN PELVIS W CONTRAST  Result Date: 07/22/2021 CLINICAL DATA:  Fever. ERCP and cholecystectomy 5/6 days ago. Recent therapy of antibiotics. Chills. Nausea. Diabetes. Prior pancreatic stent. Sphincterotomy. EXAM: CT ABDOMEN AND PELVIS WITH CONTRAST TECHNIQUE: Multidetector CT imaging of the abdomen and pelvis was performed using the standard protocol following bolus administration of intravenous contrast. CONTRAST:  1036mOMNIPAQUE IOHEXOL 300 MG/ML  SOLN COMPARISON:  07/14/2021 abdominopelvic CT.  MRI of 07/14/2021 FINDINGS: Lower chest: 3 mm left lower lobe pulmonary nodule on 07/05 similar to on the recent exam and not described on the report of 04/28/2014. Images not available. Normal heart size without pericardial or pleural effusion. Hepatobiliary: Subtly irregular hepatic capsule with prominence of the lateral segment left lobe. Chronic marked biliary duct dilatation involving segment 6 and the caudate lobe. Trace  pneumobilia within the left hepatic lobe which is likely iatrogenic. Interval cholecystectomy. No common duct dilatation or evidence of choledocholithiasis. No fluid collection within the operative bed. Mild pericholecystic edema and trace air are expected in the postoperative setting. Pancreas: Normal, without mass or ductal dilatation. Spleen: Splenomegaly at 15.6 cm craniocaudal. Maximal transverse 12.8 x 6.6 cm. (volume = 690). Adrenals/Urinary Tract: Normal adrenal glands. Left greater than right renal sinus cysts. No hydronephrosis. Normal urinary bladder. Stomach/Bowel: Normal stomach, without wall thickening. Nonspecific submucosal fat prominence involving the ascending colon. Normal terminal ileum and appendix. Normal small bowel. Vascular/Lymphatic: Normal caliber of the aorta and branch vessels. Patent portal and splenic veins. Mildly prominent veins in the splenic  hilum. No abdominopelvic adenopathy. small nodes within the jejunal mesentery with increased density in the mesenteric fat on 44/2. No pelvic sidewall adenopathy. Reproductive: Normal prostate. Other: No free intraperitoneal air.  No free fluid. Musculoskeletal: No acute osseous abnormality. IMPRESSION: 1. Expected appearance after cholecystectomy, without postoperative fluid collection, common duct dilatation, or choledocholithiasis. 2. Chronic marked intrahepatic biliary duct dilatation involving the right and caudate lobes, similar back to 2018 and by report back to 2015. This has been evaluated on multiple prior imaging studies, possibly the sequelae of remote insult. 3. Suspicion of mild cirrhosis with splenomegaly and possible portal venous hypertension. 4. Jejunal mesenteric findings which suggest adenitis/panniculitis, of indeterminate clinical significance. Can be seen in asymptomatic individuals. Present back to 08/19/2017. 5. 3 mm left lower lobe pulmonary nodule. No follow-up needed if patient is low-risk. Non-contrast chest CT can  be considered in 12 months if patient is high-risk. This recommendation follows the consensus statement: Guidelines for Management of Incidental Pulmonary Nodules Detected on CT Images: From the Fleischner Society 2017; Radiology 2017; 284:228-243. Electronically Signed   By: Abigail Miyamoto M.D.   On: 07/22/2021 12:36   CT ABDOMEN PELVIS W CONTRAST  Result Date: 07/14/2021 CLINICAL DATA:  Abdominal pain, acute, nonlocalized suspect choledocholithiasis EXAM: CT ABDOMEN AND PELVIS WITH CONTRAST TECHNIQUE: Multidetector CT imaging of the abdomen and pelvis was performed using the standard protocol following bolus administration of intravenous contrast. CONTRAST:  157m OMNIPAQUE IOHEXOL 350 MG/ML SOLN COMPARISON:  August 19, 2017 FINDINGS: Lower chest: No acute abnormality. Hepatobiliary: Revisualization of intrahepatic biliary ductal dilation of predominately hepatic segment 6. This is similar in comparison to prior MRI. Unchanged appearance of the common bile duct. Gallbladder is completely decompressed. Cholelithiasis. There is adjacent haziness of the fat along the gallbladder. Hepatic steatosis. Pancreas: Unremarkable. No pancreatic ductal dilatation or surrounding inflammatory changes. Spleen: Unchanged mild splenomegaly. Adrenals/Urinary Tract: Adrenal glands are unremarkable. No hydronephrosis. Parapelvic cysts. Kidneys enhance symmetrically. No obstructing nephrolithiasis. Subcentimeter hypodense mass of the LEFT kidney is too small to accurately characterize. Bladder is unremarkable. Stomach/Bowel: No evidence of bowel obstruction. Appendix is normal. Moderate colonic stool burden diffusely throughout the colon. Misty mesentery, nonspecific. Vascular/Lymphatic: No significant vascular findings are present. No enlarged retroperitoneal lymph nodes by size criteria. There are scattered mesenteric lymph nodes which are mildly prominent with representative prominent mesenteric lymph node measuring 8 mm in the  short axis (series 3, image 38). Reproductive: Prostate is present. Other: No free air or free fluid. Musculoskeletal: No acute or significant osseous findings. IMPRESSION: 1. The gallbladder is decompressed around a cholelithiasis. However, there is haziness of the adjacent fat with subjective wall thickening of the gallbladder. Findings could reflect acute on chronic cholecystitis. No choledocholithiasis visualized. 2. Similar appearance of intrahepatic biliary ductal dilation of predominately hepatic segment 6. 3. Hepatic steatosis. 4. Misty mesentery, nonspecific Electronically Signed   By: SValentino SaxonM.D.   On: 07/14/2021 12:46   MR ABDOMEN MRCP WO CONTRAST  Result Date: 07/14/2021 CLINICAL DATA:  Jaundice. EXAM: MRI ABDOMEN WITHOUT CONTRAST  (INCLUDING MRCP) TECHNIQUE: Multiplanar multisequence MR imaging of the abdomen was performed. Heavily T2-weighted images of the biliary and pancreatic ducts were obtained, and three-dimensional MRCP images were rendered by post processing. COMPARISON:  Abdominopelvic CT same date. Ultrasound 07/11/2021 and MRI 08/19/2017. FINDINGS: Despite efforts by the technologist and patient, mild motion artifact is present on today's exam and could not be eliminated. This reduces exam sensitivity and specificity. Lower chest:  The visualized lower chest appears unremarkable.  Hepatobiliary: No morphologic changes of cirrhosis or significant hepatic steatosis. The gallbladder is contracted with mild wall thickening. Small calcified gallstone seen on CT is not well visualized. Again demonstrated is chronic volume loss and intrahepatic biliary dilatation medially in the posterior right lobe of the liver, predominately segment 6. This is unchanged from the previous MRI. There is no other intrahepatic biliary dilatation. There is no extrahepatic biliary dilatation, however, there are small somewhat linear filling defects in the mid to distal common bile duct, similar to those  seen on previous MRCP. These are not calcified on today's CT. Pancreas: Unremarkable. No pancreatic ductal dilatation or surrounding inflammatory changes. Spleen: Stable mild splenomegaly without focal abnormality. Adrenals/Urinary Tract: Both adrenal glands appear normal. Renal sinus cysts are noted bilaterally without hydronephrosis or focal renal abnormality. Stomach/Bowel: The visualized bowel appears normal, without distension, wall thickening or surrounding inflammation. Vascular/Lymphatic: There are no enlarged abdominal lymph nodes. No significant vascular findings. Other: No evidence of abdominal wall hernia or ascites. Musculoskeletal: No acute or significant osseous findings. IMPRESSION: 1. Overall findings are similar to those demonstrated on previous MRI from 08/19/2017. There are linear filling defects in the common bile duct without associated extrahepatic biliary dilatation. While these could reflect blood clots, consider biliary ascariasis. Recommend ERCP given the patient's jaundice. 2. Stable chronic intrahepatic biliary dilatation and volume loss posteriorly in the right hepatic lobe, likely benign based on stability from previous MRI's dating back to 07/12/2014. 3. Contracted gallbladder with mild nonspecific wall thickening, not impressive on recent ultrasound. 4. Bilateral renal sinus cysts. Electronically Signed   By: Richardean Sale M.D.   On: 07/14/2021 18:02   DG Chest Port 1 View  Result Date: 07/22/2021 CLINICAL DATA:  55 year old male with possible sepsis. EXAM: PORTABLE CHEST 1 VIEW COMPARISON:  No priors. FINDINGS: Lung volumes are low. No consolidative airspace disease. No pleural effusions. No pneumothorax. No pulmonary nodule or mass noted. Pulmonary vasculature and the cardiomediastinal silhouette are within normal limits. IMPRESSION: 1. Low lung volumes without radiographic evidence of acute cardiopulmonary disease. Electronically Signed   By: Vinnie Langton M.D.   On:  07/22/2021 10:22   DG ERCP  Result Date: 07/16/2021 CLINICAL DATA:  ERCP for jaundice EXAM: ERCP TECHNIQUE: Multiple spot images obtained with the fluoroscopic device and submitted for interpretation post-procedure. FLUOROSCOPY TIME:  78 seconds (52.5 mGy) COMPARISON:  MRCP-07/14/2021; CT abdomen pelvis-07/14/2021 FINDINGS: Two spot intraoperative fluoroscopic images the right upper abdominal quadrant during ERCP provided for review. Initial image demonstrates an ERCP probe overlying the right upper abdominal quadrant. There is selective cannulation and opacification of the CBD which appears nondilated. Subsequent images demonstrate insufflation of a balloon within the distal aspect of the CBD with subsequent presumed biliary sweeping and sphincterotomy. IMPRESSION: ERCP with presumed biliary sweeping and sphincterotomy. These images were submitted for radiologic interpretation only. Please see the procedural report for the amount of contrast and the fluoroscopy time utilized. Electronically Signed   By: Sandi Mariscal M.D.   On: 07/16/2021 09:45   US Abdomen Limited RUQ (LIVER/GB)  Result Date: 07/12/2021 CLINICAL DATA:  Recent jaundice with right upper quadrant pain, initial encounter EXAM: ULTRASOUND ABDOMEN LIMITED RIGHT UPPER QUADRANT COMPARISON:  08/19/2017 MRI FINDINGS: Gallbladder: Gallbladder is not well visualized. Some increased echogenicity is noted in the expected region of the gallbladder which may represent small stones in a decompressed gallbladder. Common bile duct: Diameter: 6 mm. This is within normal limits for the patient's given age. Liver: Increased echogenicity is noted consistent with  fatty infiltration. Mild fatty sparing is noted adjacent to the gallbladder fossa. No other focal abnormality is noted. Portal vein is patent on color Doppler imaging with normal direction of blood flow towards the liver. Other: None. IMPRESSION: Fatty liver. Question decompressed gallbladder with small  stones within. No stones were noted on the prior MRI. HIDA scan may be helpful to assess gallbladder function. Electronically Signed   By: Inez Catalina M.D.   On: 07/12/2021 03:44    Orson Eva, DO  [facility]  If 7PM-7AM, please contact night-coverage [email or web address] Password [email or web address] 07/24/2021, 11:50 AM   LOS: 1 day

## 2021-07-24 NOTE — Progress Notes (Signed)
Pt being transported to [facility]. Report given to [place] with [facility] at 2123. Pt in transport to [facility] at 2150. This nurse gave report to [place] at [facility] at 2151.

## 2021-07-25 ENCOUNTER — Inpatient Hospital Stay: Payer: BC Managed Care – PPO | Admitting: Family Medicine

## 2021-07-25 ENCOUNTER — Inpatient Hospital Stay (HOSPITAL_COMMUNITY): Payer: BC Managed Care – PPO

## 2021-07-25 DIAGNOSIS — R531 Weakness: Secondary | ICD-10-CM

## 2021-07-25 DIAGNOSIS — R509 Fever, unspecified: Principal | ICD-10-CM

## 2021-07-25 DIAGNOSIS — D61818 Other pancytopenia: Secondary | ICD-10-CM

## 2021-07-25 DIAGNOSIS — K8309 Other cholangitis: Secondary | ICD-10-CM

## 2021-07-25 LAB — EHRLICHIA ANTIBODY PANEL
E chaffeensis (HGE) Ab, IgG: NEGATIVE
E chaffeensis (HGE) Ab, IgM: NEGATIVE
E. Chaffeensis (HME) IgM Titer: NEGATIVE
E.Chaffeensis (HME) IgG: NEGATIVE

## 2021-07-25 LAB — GLUCOSE, CAPILLARY
Glucose-Capillary: 115 mg/dL — ABNORMAL HIGH (ref 70–99)
Glucose-Capillary: 122 mg/dL — ABNORMAL HIGH (ref 70–99)
Glucose-Capillary: 152 mg/dL — ABNORMAL HIGH (ref 70–99)
Glucose-Capillary: 175 mg/dL — ABNORMAL HIGH (ref 70–99)

## 2021-07-25 LAB — URINE CULTURE: Culture: NO GROWTH

## 2021-07-25 LAB — LYME DISEASE SEROLOGY W/REFLEX: Lyme Total Antibody EIA: NEGATIVE

## 2021-07-25 LAB — ROCKY MTN SPOTTED FVR ABS PNL(IGG+IGM)
RMSF IgG: NEGATIVE
RMSF IgM: 0.86 index (ref 0.00–0.89)

## 2021-07-25 MED ORDER — GADOBUTROL 1 MMOL/ML IV SOLN
10.0000 mL | Freq: Once | INTRAVENOUS | Status: AC | PRN
  Administered 2021-07-25: 10 mL via INTRAVENOUS

## 2021-07-25 MED ORDER — TRAZODONE HCL 50 MG PO TABS
50.0000 mg | ORAL_TABLET | Freq: Every evening | ORAL | Status: DC | PRN
  Administered 2021-07-26: 50 mg via ORAL
  Filled 2021-07-25: qty 1

## 2021-07-25 MED ORDER — GUAIFENESIN 100 MG/5ML PO LIQD
5.0000 mL | ORAL | Status: DC | PRN
  Administered 2021-07-25 – 2021-07-27 (×4): 5 mL via ORAL
  Filled 2021-07-25 (×4): qty 5

## 2021-07-25 MED ORDER — PIPERACILLIN-TAZOBACTAM 3.375 G IVPB
3.3750 g | Freq: Three times a day (TID) | INTRAVENOUS | Status: DC
  Administered 2021-07-25 – 2021-07-29 (×12): 3.375 g via INTRAVENOUS
  Filled 2021-07-25 (×14): qty 50

## 2021-07-25 NOTE — Progress Notes (Signed)
PROGRESS NOTE  Omar Travis  DOB: 1966-01-19  PCP: Billie Ruddy, MD [phone]  DOA: 07/22/2021  LOS: 2 days  Hospital Day: 4  Chief Complaint  Patient presents with   Code Sepsis   Brief narrative: Omar Travis is a 55 y.o. male with PMH significant for DM2, HTN, HLD, GERD, depression who was recently hospitalized 10/23-10/26 for cholecystitis, choledocholithiasis and cholangitis Patient presented to the ED at [facility] on 10/31 with anorexia, chills, generalized malaise for 2 to 3 days In the ED, patient met SIRS criteria with fever, tachycardia. CT chest ruled out pulm embolism and any other acute cardiopulmonary process CT abdomen pelvis without any acute abnormalities only changes from recent surgery No source of infection was identified. Patient was started on empiric antibiotic and admitted to hospitalist service. Patient was transferred to [facility] for ID evaluation.  Subjective: Patient was seen and examined this morning.  Pleasant middle-aged Caucasian male.  Looks tired.  On low-flow oxygen.  Lying down in bed.  Wife at bedside. Chart reviewed T-max 102.9 last night.  Continues to have other episodes of mild fever Blood pressure and heart rate are stable No labs this morning  Assessment/Plan: SIRS/fever unknown origin -CTA chest--no PE, no infiltrates -CT abd--mild pericholecystic edema and trace air as expected in porta hepatis area post op -blood cultures neg -Continues to have episodes of fever, 102.9 last night -Currently on empiric antibiotics -ID consulted Recent Labs  Lab 07/21/21 1718 07/22/21 1048 07/22/21 1243 07/23/21 0613 07/24/21 0455  WBC  --  7.4  --  4.3 3.4*  LATICACIDVEN  --  1.0 0.9  --   --   PROCALCITON 67.15  --   --  54.64 34.89   Elevated LFTs -was recently hospitalized 10/23-10/26 for cholecystitis, choledocholithiasis and cholangitis -sp ERCP with a sphincterotomy and stent on 10/25 -sp lap chole  and cholecystectomy on 10/26S/p cholecystectomy -Currently has no abdominal pain. -Repeat LFTs tomorrow. Recent Labs  Lab 07/22/21 1048 07/23/21 0613  AST 28 43*  ALT 44 45*  ALKPHOS 115 126  BILITOT 1.6* 1.5*  PROT 5.7* 5.9*  ALBUMIN 2.6* 2.6*   Pancytopenia -Unclear why all cell lines are depressed.  No history of malignancy. Recent Labs  Lab 07/22/21 1048 07/23/21 0613 07/24/21 0455  WBC 7.4 4.3 3.4*  NEUTROABS 6.8  --   --   HGB 10.7* 10.6* 9.8*  HCT 33.4* 32.8* 31.0*  MCV 85.9 85.4 86.8  PLT 120* 96* 90*   Uncontrolled type 2 diabetes mellitus -A1c 9.2 on 10/17 -Home meds include Ozempic 1 mg weekly, Lantus 56 units daily, Invokana 300 mg daily -Currently oral meds on hold -Blood sugar level controlled less than 200 consistently just with sliding scale insulin.  Continue to monitor. Recent Labs  Lab 07/24/21 0744 07/24/21 1138 07/24/21 1624 07/24/21 2031 07/25/21 0635  GLUCAP 88 157* 139* 182* 115*   Essential hypertension -Continue lisinopril 5 mg daily.  Hyperlipidemia -Continue Crestor, fenofibrate  GERD -PPI  Depression -Continue Lexapro, oxycodone, trazodone at bedtime   Mobility: Encourage ambulation Living condition: Lives at home with wife Goals of care:   Code Status: Full Code  Nutritional status: Body mass index is 31.87 kg/m.     Diet:  Diet Order             Diet Carb Modified Fluid consistency: Thin; Room service appropriate? Yes  Diet effective now  DVT prophylaxis:  enoxaparin (LOVENOX) injection 40 mg Start: 07/22/21 1400   Antimicrobials: IV Unasyn Fluid: NS 100 mill per hour Consultants: ID Family Communication: Wife at bedside  Status is: Inpatient  Remains inpatient appropriate because: Needs further ID work-up  Dispo: The patient is from: Home              Anticipated d/c is to: Likely home in next 2 to 3 days              Patient currently is not medically stable to d/c.   Difficult to  place patient No     Infusions:   sodium chloride 100 mL/hr at 07/25/21 0410    Scheduled Meds:  enoxaparin (LOVENOX) injection  40 mg Subcutaneous Q24H   escitalopram  10 mg Oral Daily   influenza vac split quadrivalent PF  0.5 mL Intramuscular Tomorrow-1000   insulin aspart  0-5 Units Subcutaneous QHS   insulin aspart  0-9 Units Subcutaneous TID WC   ketorolac  30 mg Intravenous Once   lisinopril  5 mg Oral Daily   loratadine  10 mg Oral Daily   pantoprazole  40 mg Oral Daily   rosuvastatin  5 mg Oral Daily    PRN meds: acetaminophen, ondansetron **OR** ondansetron (ZOFRAN) IV, sodium chloride, traZODone   Antimicrobials: Anti-infectives (From admission, onward)    Start     Dose/Rate Route Frequency Ordered Stop   07/22/21 1800  Ampicillin-Sulbactam (UNASYN) 3 g in sodium chloride 0.9 % 100 mL IVPB  Status:  Discontinued        3 g 200 mL/hr over 30 Minutes Intravenous Every 6 hours 07/22/21 1704 07/25/21 1025       Objective: Vitals:   07/25/21 0655 07/25/21 0814  BP: 124/83 129/73  Pulse: 92 99  Resp: 16 16  Temp: 98.4 F (36.9 C) 98.4 F (36.9 C)  SpO2: 99% 98%    Intake/Output Summary (Last 24 hours) at 07/25/2021 1029 Last data filed at 07/25/2021 0844 Gross per 24 hour  Intake 6201.96 ml  Output 700 ml  Net 5501.96 ml   Filed Weights   07/22/21 0952  Weight: 106.6 kg   Weight change:  Body mass index is 31.87 kg/m.   Physical Exam: General exam: Pleasant, middle-aged Caucasian male.  Looks weak Skin: No rashes, lesions or ulcers. HEENT: Atraumatic, normocephalic, no obvious bleeding Lungs: Clear to auscultation bilaterally CVS: Regular rate and rhythm, no murmur GI/Abd soft, nontender, nondistended, bowel sound present CNS: Alert, awake, oriented x3 Psychiatry: Mood appropriate Extremities: No edema, no calf tenderness  Data Review: I have personally reviewed the laboratory data and studies available.  F/u labs ordered Unresulted  Labs (From admission, onward)     Start     Ordered   07/26/21 0500  CBC with Differential/Platelet  Daily,   R      07/25/21 1029   07/26/21 1962  Basic metabolic panel  Daily,   R      07/25/21 1029   07/24/21 1214  Lyme Disease Serology w/Reflex  Once,   R        07/24/21 1213   07/24/21 1213  Urine Culture  Once,   R       Question:  Indication  Answer:  Sepsis   07/24/21 1213   22/97/98 9211  Ehrlichia antibody panel  Once,   R        07/24/21 1213   07/24/21 1213  Rocky mtn spotted fvr abs pnl(IgG+IgM)  Once,   R        07/24/21 1213            Signed, Terrilee Croak, MD [facility] 07/25/2021

## 2021-07-25 NOTE — Progress Notes (Signed)
Mobility Specialist Progress Note:   07/25/21 1030  Mobility  Activity Ambulated in hall  Level of Assistance Independent  Assistive Device Other (Comment) (IV Pole)  Distance Ambulated (ft) 570 ft  Mobility Ambulated independently in hallway  Mobility Response Tolerated well  Mobility performed by Mobility specialist  $Mobility charge 1 Mobility   Pt asx during ambulation. No dizziness, or SOB.   Nelta Numbers Mobility Specialist  Phone [phone]

## 2021-07-25 NOTE — Plan of Care (Signed)

## 2021-07-25 NOTE — Progress Notes (Signed)
Pharmacy Antibiotic Note  Omar Travis is a 55 y.o. male admitted on 07/22/2021 with acute febrile illness.  Recent history of cholecystitis, choledocholithiasis and cholangitis s/p ERCP with sphincterotomy and stent on 10/25 and lap chole and cholecystectomy on 10/26.  Concern with intra-abdominal infection, so Pharmacy has been consulted to broaden from Unasyn to [place].  Renal function stable, intermittent fevers, WBC 3.4, PCT down to 35.  Plan: Zosyn EID 3.375gm IV Q8H Pharmacy will sign off with stable renal fxn.  Thank you for the consult!  Height: 6' (182.9 cm) Weight: 106.6 kg (235 lb) IBW/kg (Calculated) : 77.6  Temp (24hrs), Avg:99.7 F (37.6 C), Min:98.4 F (36.9 C), Max:102.9 F (39.4 C)  Recent Labs  Lab 07/22/21 1048 07/22/21 1243 07/23/21 0613 07/24/21 0455  WBC 7.4  --  4.3 3.4*  CREATININE 0.91  --  0.78 0.76  LATICACIDVEN 1.0 0.9  --   --     Estimated Creatinine Clearance: 136.3 mL/min (by C-G formula based on SCr of 0.76 mg/dL).    Allergies  Allergen Reactions   Benadryl Allergy [Diphenhydramine Hcl] Rash   Flexeril [Cyclobenzaprine]     Causes "breakout"    CTX 10/31  Unasyn 10/31 >> 11/3 Zosyn 11/3 >>    10/31 BCx - NGTD 10/31 UCx - negative 11/2 BCx - NGTD 11/2 UCx -   Peregrine Nolt D. Mina Marble, PharmD, BCPS, [place] 07/25/2021, 10:43 AM

## 2021-07-25 NOTE — Consult Note (Signed)
[facility] for Infectious Disease    Date of Admission:  07/22/2021    Reason for Consult: Fever with source    Referring Provider: Terrilee Croak , MD Primary Care Provider: Billie Ruddy, MD  Omar Travis is 55 year old s/p spincterotomy and stent on 10/25 and s/p lap chole on 10/26 who is presenting with fever without source. Most likely abdominal source of infection given recent surgery. No other localizing symptoms at this time. Ordered MRCP to evaluate possible changes since CT abdomen pelvis and for possible stones. Will follow up other possible causes of fever without source. Unasyn switched to Zosyn  Assessment: 1.Fever without source  2.Pancytopenia  3.Elevated liver enzymes   Plan: MRCP Stop Unasyn Start Zosyn    Active Problems:   Febrile illness, acute   FUO (fever of unknown origin)   SIRS (systemic inflammatory response syndrome) (HCC)   Pancytopenia (HCC)    enoxaparin (LOVENOX) injection  40 mg Subcutaneous Q24H   escitalopram  10 mg Oral Daily   influenza vac split quadrivalent PF  0.5 mL Intramuscular Tomorrow-1000   insulin aspart  0-5 Units Subcutaneous QHS   insulin aspart  0-9 Units Subcutaneous TID WC   ketorolac  30 mg Intravenous Once   lisinopril  5 mg Oral Daily   loratadine  10 mg Oral Daily   pantoprazole  40 mg Oral Daily   rosuvastatin  5 mg Oral Daily    HPI: Omar Travis is a 55 y.o. male living with depression , HTN, HLD, GERD, hypertriglyceridemia, HTN , uncontrolled insulin dependent diabetes, and admission 10/23- 10/26 for acute calculus cholecystitis s/p laparoscopic cholecystectomy, and choledocholithiasis s/p sphincterotomy x2 and pancreatic stent placement. Patient was discharged. He started to have symptoms a few days before readmission on 10/31. He had chill and malaise. His wife notes he was confused and febrile on Sunday. The thermometer was inaccurate at home. She calibrated with EMS thermometer and  estimates patient had a fever of 103 all day.   Patient admitted to [facility] on 10/31 and met SIR criteria. CT abdomen pelvis at that time was not obvious for source of infection . Notes some gas in porta hepatis on CT angio chest.   No pulmonary infiltrates or effusions on CT angio Chest 10/31.  Patient has continue to be febrile on Unasyn. Mild tenderness in abdomen and having some left costovertebral angle pain.    Review of Systems: Review of Systems  Constitutional:  Positive for chills and fever.  HENT:  Positive for congestion. Negative for sinus pain.   Eyes:  Negative for blurred vision and photophobia.  Respiratory:  Negative for cough and sputum production.   Cardiovascular:  Negative for chest pain and leg swelling.  Gastrointestinal:  Positive for abdominal pain (mild ,). Negative for constipation, melena and nausea.       Loose stools   Genitourinary:  Negative for frequency and hematuria.  Musculoskeletal:  Positive for back pain. Negative for falls.   Past Medical History:  Diagnosis Date   Diabetes ([place])    GERD (gastroesophageal reflux disease)    HTN (hypertension)    Hyperlipidemia     Social History   Tobacco Use   Smoking status: Never   Smokeless tobacco: Never  Substance Use Topics   Alcohol use: Yes   Drug use: Never    Family History  Problem Relation Age of Onset   Diabetes Mother  Liver cancer Father    Diabetes Father    Allergies  Allergen Reactions   Benadryl Allergy [Diphenhydramine Hcl] Rash   Flexeril [Cyclobenzaprine]     Causes "breakout"    OBJECTIVE: Blood pressure 129/73, pulse 99, temperature 98.4 F (36.9 C), temperature source Oral, resp. rate 16, height 6' (1.829 m), weight 106.6 kg, SpO2 98 %.  Physical Exam Constitutional:      Appearance: He is obese. He is ill-appearing. He is not toxic-appearing.  HENT:     Head: Normocephalic and atraumatic.     Mouth/Throat:     Mouth: Mucous membranes are moist.      Pharynx: No oropharyngeal exudate.  Eyes:     General: No scleral icterus. Cardiovascular:     Rate and Rhythm: Normal rate and regular rhythm.  Pulmonary:     Effort: Pulmonary effort is normal.     Breath sounds: Normal breath sounds.  Abdominal:     General: Bowel sounds are normal. There is no distension.     Tenderness: There is abdominal tenderness.  Musculoskeletal:        General: No swelling or tenderness.  Skin:    General: Skin is warm and dry.  Neurological:     Mental Status: He is oriented to person, place, and time. Mental status is at baseline.  Psychiatric:        Mood and Affect: Mood normal.        Behavior: Behavior normal.    Lab Results Lab Results  Component Value Date   WBC 3.4 (L) 07/24/2021   HGB 9.8 (L) 07/24/2021   HCT 31.0 (L) 07/24/2021   MCV 86.8 07/24/2021   PLT 90 (L) 07/24/2021    Lab Results  Component Value Date   CREATININE 0.76 07/24/2021   BUN 9 07/24/2021   NA 133 (L) 07/24/2021   K 4.1 07/24/2021   CL 105 07/24/2021   CO2 24 07/24/2021    Lab Results  Component Value Date   ALT 45 (H) 07/23/2021   AST 43 (H) 07/23/2021   ALKPHOS 126 07/23/2021   BILITOT 1.5 (H) 07/23/2021     Microbiology: Recent Results (from the past 240 hour(s))  Surgical pcr screen     Status: Abnormal   Collection Time: 07/16/21 11:35 PM   Specimen: Nasal Mucosa; Nasal Swab  Result Value Ref Range Status   MRSA, PCR NEGATIVE NEGATIVE Final   Staphylococcus aureus POSITIVE (A) NEGATIVE Final    Comment: (NOTE) The Xpert SA Assay (FDA approved for NASAL specimens in patients 31 years of age and older), is one component of a comprehensive surveillance program. It is not intended to diagnose infection nor to guide or monitor treatment. Performed at [facility], [place] [address] [place], [place] [address]   Blood Culture (routine x 2)     Status: None (Preliminary result)   Collection Time: 07/22/21 10:49 AM   Specimen: BLOOD RIGHT HAND   Result Value Ref Range Status   Specimen Description BLOOD RIGHT HAND  Final   Special Requests   Final    BOTTLES DRAWN AEROBIC AND ANAEROBIC Blood Culture adequate volume   Culture   Final    NO GROWTH 3 DAYS Performed at [facility], [address], [place], [place] [address]    Report Status PENDING  Incomplete  Blood Culture (routine x 2)     Status: None (Preliminary result)   Collection Time: 07/22/21 10:55 AM   Specimen: BLOOD LEFT HAND  Result Value Ref Range Status   Specimen Description BLOOD LEFT HAND  Final   Special Requests   Final    BOTTLES DRAWN AEROBIC AND ANAEROBIC Blood Culture results may not be optimal due to an excessive volume of blood received in culture bottles   Culture   Final    NO GROWTH 3 DAYS Performed at [facility], [address], [place], [place] [address]    Report Status PENDING  Incomplete  Resp Panel by RT-PCR (Flu A&B, Covid) Nasopharyngeal Swab     Status: None   Collection Time: 07/22/21 11:04 AM   Specimen: Nasopharyngeal Swab; Nasopharyngeal(NP) swabs in vial transport medium  Result Value Ref Range Status   SARS Coronavirus 2 by RT PCR NEGATIVE NEGATIVE Final    Comment: (NOTE) SARS-CoV-2 target nucleic acids are NOT DETECTED.  The SARS-CoV-2 RNA is generally detectable in upper respiratory specimens during the acute phase of infection. The lowest concentration of SARS-CoV-2 viral copies this assay can detect is 138 copies/mL. A negative result does not preclude SARS-Cov-2 infection and should not be used as the sole basis for treatment or other patient management decisions. A negative result may occur with  improper specimen collection/handling, submission of specimen other than nasopharyngeal swab, presence of viral mutation(s) within the areas targeted by this assay, and inadequate number of viral copies(<138 copies/mL). A negative result must be combined with clinical observations, patient history, and  epidemiological information. The expected result is Negative.  Fact Sheet for Patients:  EntrepreneurPulse.com.au  Fact Sheet for Healthcare Providers:  IncredibleEmployment.be  This test is no t yet approved or cleared by the [place] FDA and  has been authorized for detection and/or diagnosis of SARS-CoV-2 by FDA under an Emergency Use Authorization (EUA). This EUA will remain  in effect (meaning this test can be used) for the duration of the COVID-19 declaration under Section 564(b)(1) of the Act, 21 U.S.C.section 360bbb-3(b)(1), unless the authorization is terminated  or revoked sooner.       Influenza A by PCR NEGATIVE NEGATIVE Final   Influenza B by PCR NEGATIVE NEGATIVE Final    Comment: (NOTE) The Xpert Xpress SARS-CoV-2/FLU/RSV plus assay is intended as an aid in the diagnosis of influenza from Nasopharyngeal swab specimens and should not be used as a sole basis for treatment. Nasal washings and aspirates are unacceptable for Xpert Xpress SARS-CoV-2/FLU/RSV testing.  Fact Sheet for Patients: EntrepreneurPulse.com.au  Fact Sheet for Healthcare Providers: IncredibleEmployment.be  This test is not yet approved or cleared by the [place] FDA and has been authorized for detection and/or diagnosis of SARS-CoV-2 by FDA under an Emergency Use Authorization (EUA). This EUA will remain in effect (meaning this test can be used) for the duration of the COVID-19 declaration under Section 564(b)(1) of the Act, 21 U.S.C. section 360bbb-3(b)(1), unless the authorization is terminated or revoked.  Performed at [facility], [address] [place], [place] [address]   Urine Culture     Status: None   Collection Time: 07/22/21  1:21 PM   Specimen: In/Out Cath Urine  Result Value Ref Range Status   Specimen Description   Final    IN/OUT CATH URINE Performed at [facility], [address],  [place], [place] [address]    Special Requests   Final    NONE Performed at [facility], [address] [place], [place] [address]    Culture   Final    NO GROWTH Performed at [facility], [place]  [address], [place], [place] [address]    Report Status 07/23/2021 FINAL  Final  Culture, blood (Routine X 2) w Reflex to ID Panel     Status: None (Preliminary result)   Collection Time: 07/24/21 12:32 PM   Specimen: BLOOD LEFT HAND  Result Value Ref Range Status   Specimen Description BLOOD LEFT HAND  Final   Special Requests   Final    BOTTLES DRAWN AEROBIC AND ANAEROBIC Blood Culture results may not be optimal due to an excessive volume of blood received in culture bottles   Culture   Final    NO GROWTH < 24 HOURS Performed at [facility], [address] [place], [place] [address]    Report Status PENDING  Incomplete  Culture, blood (Routine X 2) w Reflex to ID Panel     Status: None (Preliminary result)   Collection Time: 07/24/21 12:32 PM   Specimen: Right Antecubital; Blood  Result Value Ref Range Status   Specimen Description RIGHT ANTECUBITAL  Final   Special Requests   Final    BOTTLES DRAWN AEROBIC AND ANAEROBIC Blood Culture results may not be optimal due to an excessive volume of blood received in culture bottles   Culture   Final    NO GROWTH < 24 HOURS Performed at [facility], [address], [place], [place] [address]    Report Status PENDING  Incomplete  Urine Culture     Status: None   Collection Time: 07/24/21 12:51 PM   Specimen: Urine, Clean Catch  Result Value Ref Range Status   Specimen Description   Final    URINE, CLEAN CATCH Performed at [facility], [address] [place], [place] [address]    Special Requests   Final    NONE Performed at [facility], [address] [place], [place] [address]    Culture   Final    NO GROWTH Performed at [facility], [place] [address], [place], [place] [address]    Report Status 07/25/2021 FINAL  Final     Tamsen Snider, MD PGY3 Internal Medicine [phone]   07/25/2021 2:31 PM

## 2021-07-25 NOTE — Plan of Care (Signed)
  Problem: Clinical Measurements: Goal: Ability to maintain clinical measurements within normal limits will improve Outcome: Progressing   

## 2021-07-26 DIAGNOSIS — R531 Weakness: Secondary | ICD-10-CM | POA: Diagnosis not present

## 2021-07-26 DIAGNOSIS — K8309 Other cholangitis: Secondary | ICD-10-CM | POA: Diagnosis not present

## 2021-07-26 DIAGNOSIS — D61818 Other pancytopenia: Secondary | ICD-10-CM | POA: Diagnosis not present

## 2021-07-26 DIAGNOSIS — R509 Fever, unspecified: Secondary | ICD-10-CM | POA: Diagnosis not present

## 2021-07-26 LAB — PROTIME-INR
INR: 1 (ref 0.8–1.2)
Prothrombin Time: 13.6 seconds (ref 11.4–15.2)

## 2021-07-26 LAB — CBC WITH DIFFERENTIAL/PLATELET
Abs Immature Granulocytes: 0.07 10*3/uL (ref 0.00–0.07)
Basophils Absolute: 0 10*3/uL (ref 0.0–0.1)
Basophils Relative: 1 %
Eosinophils Absolute: 0.1 10*3/uL (ref 0.0–0.5)
Eosinophils Relative: 2 %
HCT: 31.1 % — ABNORMAL LOW (ref 39.0–52.0)
Hemoglobin: 10.2 g/dL — ABNORMAL LOW (ref 13.0–17.0)
Immature Granulocytes: 1 %
Lymphocytes Relative: 16 %
Lymphs Abs: 0.8 10*3/uL (ref 0.7–4.0)
MCH: 27.3 pg (ref 26.0–34.0)
MCHC: 32.8 g/dL (ref 30.0–36.0)
MCV: 83.4 fL (ref 80.0–100.0)
Monocytes Absolute: 0.7 10*3/uL (ref 0.1–1.0)
Monocytes Relative: 12 %
Neutro Abs: 3.7 10*3/uL (ref 1.7–7.7)
Neutrophils Relative %: 68 %
Platelets: 112 10*3/uL — ABNORMAL LOW (ref 150–400)
RBC: 3.73 MIL/uL — ABNORMAL LOW (ref 4.22–5.81)
RDW: 14.7 % (ref 11.5–15.5)
WBC: 5.4 10*3/uL (ref 4.0–10.5)
nRBC: 0 % (ref 0.0–0.2)

## 2021-07-26 LAB — COMPREHENSIVE METABOLIC PANEL
ALT: 38 U/L (ref 0–44)
AST: 43 U/L — ABNORMAL HIGH (ref 15–41)
Albumin: 1.9 g/dL — ABNORMAL LOW (ref 3.5–5.0)
Alkaline Phosphatase: 164 U/L — ABNORMAL HIGH (ref 38–126)
Anion gap: 9 (ref 5–15)
BUN: 8 mg/dL (ref 6–20)
CO2: 23 mmol/L (ref 22–32)
Calcium: 8.3 mg/dL — ABNORMAL LOW (ref 8.9–10.3)
Chloride: 102 mmol/L (ref 98–111)
Creatinine, Ser: 0.76 mg/dL (ref 0.61–1.24)
GFR, Estimated: >60 mL/min (ref >60)
Glucose, Bld: 158 mg/dL — ABNORMAL HIGH (ref 70–99)
Potassium: 3.7 mmol/L (ref 3.5–5.1)
Sodium: 134 mmol/L — ABNORMAL LOW (ref 135–145)
Total Bilirubin: 0.9 mg/dL (ref 0.3–1.2)
Total Protein: 5.3 g/dL — ABNORMAL LOW (ref 6.5–8.1)

## 2021-07-26 LAB — HEPATITIS PANEL, ACUTE
HCV Ab: NONREACTIVE
Hep A IgM: NONREACTIVE
Hep B C IgM: NONREACTIVE
Hepatitis B Surface Ag: NONREACTIVE

## 2021-07-26 LAB — SEDIMENTATION RATE: Sed Rate: 91 mm/hr — ABNORMAL HIGH (ref 0–16)

## 2021-07-26 LAB — GLUCOSE, CAPILLARY
Glucose-Capillary: 137 mg/dL — ABNORMAL HIGH (ref 70–99)
Glucose-Capillary: 213 mg/dL — ABNORMAL HIGH (ref 70–99)
Glucose-Capillary: 160 mg/dL — ABNORMAL HIGH (ref 70–99)
Glucose-Capillary: 164 mg/dL — ABNORMAL HIGH (ref 70–99)

## 2021-07-26 LAB — C-REACTIVE PROTEIN: CRP: 14.5 mg/dL — ABNORMAL HIGH (ref <1.0)

## 2021-07-26 MED ORDER — INSULIN GLARGINE-YFGN 100 UNIT/ML ~~LOC~~ SOLN
10.0000 [IU] | Freq: Every day | SUBCUTANEOUS | Status: DC
  Administered 2021-07-26 – 2021-07-27 (×2): 10 [IU] via SUBCUTANEOUS
  Filled 2021-07-26 (×3): qty 0.1

## 2021-07-26 NOTE — Progress Notes (Signed)
[facility]  Date of Admission:  07/22/2021      Total days of antibiotics 3   Zosyn day 2         ASSESSMENT: Omar Travis is a 55 y.o. male with high fevers presenting within a week of recent lap chole and pancreatic stenting. We switched to zosyn yesterday with consieratoin of possible GI source in light of recent surgery while awaiting MRCP --> this was unrevealing for a cause; though he has now been afebrile. Will order venous dopplers to ensure no post op DVT concern (not symptomatic for this, maybe a little more leg edema on exam to the left). If no serious cause identified would support discharge with close follow up with ID/PCP for review of pending blood work (autoimmune panels included) and journaling of symptoms/fevers. Will probably plan on stopping antibiotics tomorrow 11/5 since there is no target we are treating given lack of localizing symptoms and now two re-assurring studies his recent surgery is not grossly impacted by infection. We spent a lot of time discussing this option.    PLAN: Continue zosyn for now Probably plan to stop tomorrow FU DVT studies.  May consider D/C if nothing revealing for cause of fevers for OP follow up.    Principal Problem:   FUO (fever of unknown origin) Active Problems:   Febrile illness, acute   SIRS (systemic inflammatory response syndrome) (HCC)   Pancytopenia (HCC)   Generalized weakness    enoxaparin (LOVENOX) injection  40 mg Subcutaneous Q24H   escitalopram  10 mg Oral Daily   influenza vac split quadrivalent PF  0.5 mL Intramuscular Tomorrow-1000   insulin aspart  0-5 Units Subcutaneous QHS   insulin aspart  0-9 Units Subcutaneous TID WC   insulin glargine-yfgn  10 Units Subcutaneous Daily   ketorolac  30 mg Intravenous Once   lisinopril  5 mg Oral Daily   loratadine  10 mg Oral Daily   pantoprazole  40 mg Oral Daily   rosuvastatin  5 mg Oral Daily    SUBJECTIVE: Feeling  pretty good - up in the chair eating lunch. No new concerns or symptoms. No fevers for 24h.    Review of Systems: Review of Systems  Constitutional:  Negative for chills and fever.  HENT:  Negative for tinnitus.   Eyes:  Negative for blurred vision and photophobia.  Respiratory:  Negative for cough and sputum production.   Cardiovascular:  Negative for chest pain.  Gastrointestinal:  Positive for abdominal pain (mild soreness from recent surgery). Negative for diarrhea, nausea and vomiting.  Genitourinary:  Negative for dysuria.  Skin:  Negative for rash.  Neurological:  Negative for headaches.   Allergies  Allergen Reactions   Benadryl Allergy [Diphenhydramine Hcl] Rash   Flexeril [Cyclobenzaprine]     Causes "breakout"    OBJECTIVE: Vitals:   07/25/21 1928 07/25/21 2349 07/26/21 0523 07/26/21 0853  BP: 116/77 107/68 115/78 115/77  Pulse: 95 93 92 97  Resp: 18   16  Temp: 98.8 F (37.1 C) 99.1 F (37.3 C) 98.6 F (37 C) 99.7 F (37.6 C)  TempSrc: Oral Oral Oral Oral  SpO2: 97% 99% 97% 98%  Weight:      Height:       Body mass index is 31.87 kg/m.  Physical Exam Vitals reviewed.  Constitutional:      Appearance: He is well-developed.     Comments: Seated comfortably in chair during  visit.   HENT:     Mouth/Throat:     Dentition: Normal dentition. No dental abscesses.  Cardiovascular:     Rate and Rhythm: Normal rate and regular rhythm.     Heart sounds: Normal heart sounds.  Pulmonary:     Effort: Pulmonary effort is normal.     Breath sounds: Normal breath sounds.  Abdominal:     General: There is no distension.     Palpations: Abdomen is soft.     Tenderness: There is no abdominal tenderness.     Comments: Dermabonded key hole incisions w/o signs of infection. Mild expected post op tenderness.   Lymphadenopathy:     Cervical: No cervical adenopathy.  Skin:    General: Skin is warm and dry.     Findings: No rash.  Neurological:     Mental Status: He  is alert and oriented to person, place, and time.  Psychiatric:        Judgment: Judgment normal.    Lab Results Lab Results  Component Value Date   WBC 5.4 07/26/2021   HGB 10.2 (L) 07/26/2021   HCT 31.1 (L) 07/26/2021   MCV 83.4 07/26/2021   PLT 112 (L) 07/26/2021    Lab Results  Component Value Date   CREATININE 0.76 07/26/2021   BUN 8 07/26/2021   NA 134 (L) 07/26/2021   K 3.7 07/26/2021   CL 102 07/26/2021   CO2 23 07/26/2021    Lab Results  Component Value Date   ALT 38 07/26/2021   AST 43 (H) 07/26/2021   ALKPHOS 164 (H) 07/26/2021   BILITOT 0.9 07/26/2021     Microbiology: Recent Results (from the past 240 hour(s))  Surgical pcr screen     Status: Abnormal   Collection Time: 07/16/21 11:35 PM   Specimen: Nasal Mucosa; Nasal Swab  Result Value Ref Range Status   MRSA, PCR NEGATIVE NEGATIVE Final   Staphylococcus aureus POSITIVE (A) NEGATIVE Final    Comment: (NOTE) The Xpert SA Assay (FDA approved for NASAL specimens in patients 48 years of age and older), is one component of a comprehensive surveillance program. It is not intended to diagnose infection nor to guide or monitor treatment. Performed at [facility], [place] [address] [place], [place] [address]   Blood Culture (routine x 2)     Status: None (Preliminary result)   Collection Time: 07/22/21 10:49 AM   Specimen: BLOOD RIGHT HAND  Result Value Ref Range Status   Specimen Description BLOOD RIGHT HAND  Final   Special Requests   Final    BOTTLES DRAWN AEROBIC AND ANAEROBIC Blood Culture adequate volume   Culture   Final    NO GROWTH 4 DAYS Performed at [facility], [address], [place], [place] [address]    Report Status PENDING  Incomplete  Blood Culture (routine x 2)     Status: None (Preliminary result)   Collection Time: 07/22/21 10:55 AM   Specimen: BLOOD LEFT HAND  Result Value Ref Range Status   Specimen Description BLOOD LEFT HAND  Final   Special Requests   Final     BOTTLES DRAWN AEROBIC AND ANAEROBIC Blood Culture results may not be optimal due to an excessive volume of blood received in culture bottles   Culture   Final    NO GROWTH 4 DAYS Performed at [facility], [address], [place], [place] [address]    Report Status PENDING  Incomplete  Resp Panel by RT-PCR (Flu  A&B, Covid) Nasopharyngeal Swab     Status: None   Collection Time: 07/22/21 11:04 AM   Specimen: Nasopharyngeal Swab; Nasopharyngeal(NP) swabs in vial transport medium  Result Value Ref Range Status   SARS Coronavirus 2 by RT PCR NEGATIVE NEGATIVE Final    Comment: (NOTE) SARS-CoV-2 target nucleic acids are NOT DETECTED.  The SARS-CoV-2 RNA is generally detectable in upper respiratory specimens during the acute phase of infection. The lowest concentration of SARS-CoV-2 viral copies this assay can detect is 138 copies/mL. A negative result does not preclude SARS-Cov-2 infection and should not be used as the sole basis for treatment or other patient management decisions. A negative result may occur with  improper specimen collection/handling, submission of specimen other than nasopharyngeal swab, presence of viral mutation(s) within the areas targeted by this assay, and inadequate number of viral copies(<138 copies/mL). A negative result must be combined with clinical observations, patient history, and epidemiological information. The expected result is Negative.  Fact Sheet for Patients:  EntrepreneurPulse.com.au  Fact Sheet for Healthcare Providers:  IncredibleEmployment.be  This test is no t yet approved or cleared by the [place] FDA and  has been authorized for detection and/or diagnosis of SARS-CoV-2 by FDA under an Emergency Use Authorization (EUA). This EUA will remain  in effect (meaning this test can be used) for the duration of the COVID-19 declaration under Section 564(b)(1) of the Act, 21 U.S.C.section 360bbb-3(b)(1),  unless the authorization is terminated  or revoked sooner.       Influenza A by PCR NEGATIVE NEGATIVE Final   Influenza B by PCR NEGATIVE NEGATIVE Final    Comment: (NOTE) The Xpert Xpress SARS-CoV-2/FLU/RSV plus assay is intended as an aid in the diagnosis of influenza from Nasopharyngeal swab specimens and should not be used as a sole basis for treatment. Nasal washings and aspirates are unacceptable for Xpert Xpress SARS-CoV-2/FLU/RSV testing.  Fact Sheet for Patients: EntrepreneurPulse.com.au  Fact Sheet for Healthcare Providers: IncredibleEmployment.be  This test is not yet approved or cleared by the [place] FDA and has been authorized for detection and/or diagnosis of SARS-CoV-2 by FDA under an Emergency Use Authorization (EUA). This EUA will remain in effect (meaning this test can be used) for the duration of the COVID-19 declaration under Section 564(b)(1) of the Act, 21 U.S.C. section 360bbb-3(b)(1), unless the authorization is terminated or revoked.  Performed at [facility], [address], [place], [place] [address]   Urine Culture     Status: None   Collection Time: 07/22/21  1:21 PM   Specimen: In/Out Cath Urine  Result Value Ref Range Status   Specimen Description   Final    IN/OUT CATH URINE Performed at [facility], [address], [place], [place] [address]    Special Requests   Final    NONE Performed at [facility], [address], [place], [place] [address]    Culture   Final    NO GROWTH Performed at [facility], [place] [address] [place], [place] [address]    Report Status 07/23/2021 FINAL  Final  Culture, blood (Routine X 2) w Reflex to ID Panel     Status: None (Preliminary result)   Collection Time: 07/24/21 12:32 PM   Specimen: BLOOD LEFT HAND  Result Value Ref Range Status   Specimen Description BLOOD LEFT HAND  Final   Special Requests   Final    BOTTLES DRAWN AEROBIC AND ANAEROBIC Blood  Culture results may not be optimal due to an  excessive volume of blood received in culture bottles   Culture   Final    NO GROWTH 2 DAYS Performed at [facility], [address], [place], [place] [address]    Report Status PENDING  Incomplete  Culture, blood (Routine X 2) w Reflex to ID Panel     Status: None (Preliminary result)   Collection Time: 07/24/21 12:32 PM   Specimen: Right Antecubital; Blood  Result Value Ref Range Status   Specimen Description RIGHT ANTECUBITAL  Final   Special Requests   Final    BOTTLES DRAWN AEROBIC AND ANAEROBIC Blood Culture results may not be optimal due to an excessive volume of blood received in culture bottles   Culture   Final    NO GROWTH 2 DAYS Performed at [facility], [address] [place], [place] [address]    Report Status PENDING  Incomplete  Urine Culture     Status: None   Collection Time: 07/24/21 12:51 PM   Specimen: Urine, Clean Catch  Result Value Ref Range Status   Specimen Description   Final    URINE, CLEAN CATCH Performed at [facility], [address], [place], [place] [address]    Special Requests   Final    NONE Performed at [facility], [address], [place], [place] [address]    Culture   Final    NO GROWTH Performed at [facility], [place] [address] [place], [place] [address]    Report Status 07/25/2021 FINAL  Final    Janene Madeira, MSN, NP-C [facility] Pager: [phone]  @TODAY @ 4:07 PM

## 2021-07-26 NOTE — Progress Notes (Signed)
Mobility Specialist Criteria Algorithm Info.  Mobility Team: [facility] elevated:Self regulated Activity: Ambulated in hall; Ambulated to bathroom (in chair before and after ambulation) Range of motion: Active; All extremities Level of assistance: Independent Assistive device: None Mobility response: Tolerated well Bed Position: Chair  Patient received in recliner chair. Ambulated to bathroom then hallway independently with steady gait. Tolerated ambulation well without complaint or incident. Was left in recliner chair with all needs met.   07/26/2021 2:32 PM

## 2021-07-26 NOTE — Progress Notes (Signed)
PROGRESS NOTE  Omar Travis  DOB: Jan 26, 1966  PCP: Omar Ruddy, MD [id number]  DOA: 07/22/2021  LOS: 3 days  Hospital Day: 5  Chief Complaint  Patient presents with   Code Sepsis   Brief narrative: Omar Travis is a 55 y.o. male with PMH significant for DM2, HTN, HLD, GERD, depression who was recently hospitalized 10/23-10/26 for cholecystitis, choledocholithiasis and cholangitis Patient presented to the ED at [facility] on 10/31 with anorexia, chills, generalized malaise for 2 to 3 days In the ED, patient met SIRS criteria with fever, tachycardia. CT chest ruled out pulm embolism and any other acute cardiopulmonary process CT abdomen pelvis without any acute abnormalities only changes from recent surgery No source of infection was identified. Patient was started on empiric antibiotic and admitted to hospitalist service. Patient was transferred to [facility] for ID evaluation.  Subjective: Patient was seen and examined this morning. Lying on bed.  Not in distress.  No new symptoms. Feels the same.   No nausea vomiting.  Tolerating oral intake. Wife at bedside.  Assessment/Plan: SIRS/fever unknown origin --was recently hospitalized 10/23-10/26 for cholecystitis, choledocholithiasis and cholangitis -sp ERCP with a sphincterotomy and stent on 10/25 -sp lap chole and cholecystectomy on 10/26S/p cholecystectomy -Currently has no abdominal pain.  MRCP 11/3 without any postsurgical complication. -CTA chest on admission--no PE, no infiltrates -blood cultures neg -Currently on empiric antibiotics -Temperature between 99-100 in last 24 hours.  ID consult appreciated. Recent Labs  Lab 07/21/21 1718 07/22/21 1048 07/22/21 1243 07/23/21 0613 07/24/21 0455 07/26/21 0319  WBC  --  7.4  --  4.3 3.4* 5.4  LATICACIDVEN  --  1.0 0.9  --   --   --   PROCALCITON 67.15  --   --  54.64 34.89  --    Fatty liver/early liver cirrhosis -Occasional alcohol user.   Reports history of fatty liver. -Ultrasound abdomen from 11/3 showed cirrhotic changes. -Mildly elevated liver enzymes.  GI follow-up as outpatient -Obtain ammonia and INR in the morning labs tomorrow Recent Labs  Lab 07/22/21 1048 07/23/21 0613 07/26/21 0319  AST 28 43* 43*  ALT 44 45* 38  ALKPHOS 115 126 164*  BILITOT 1.6* 1.5* 0.9  PROT 5.7* 5.9* 5.3*  ALBUMIN 2.6* 2.6* 1.9*   Pancytopenia -Unclear why all cell lines were depressed.  No history of malignancy.  Cell counts improving today. Recent Labs  Lab 07/22/21 1048 07/23/21 0613 07/24/21 0455 07/26/21 0319  WBC 7.4 4.3 3.4* 5.4  NEUTROABS 6.8  --   --  3.7  HGB 10.7* 10.6* 9.8* 10.2*  HCT 33.4* 32.8* 31.0* 31.1*  MCV 85.9 85.4 86.8 83.4  PLT 120* 96* 90* 112*   Uncontrolled type 2 diabetes mellitus -A1c 9.2 on 10/17 -Home meds include Ozempic 1 mg weekly, Lantus 56 units daily, Invokana 300 mg daily -Currently oral meds on hold and patient is on sliding scale insulin only.  I will resume Lantus today at 10 units -Continue Accu-Cheks Recent Labs  Lab 07/25/21 0635 07/25/21 1126 07/25/21 1612 07/25/21 2210 07/26/21 0756  GLUCAP 115* 122* 152* 175* 137*   Essential hypertension -Continue lisinopril 5 mg daily.  Hyperlipidemia -Continue Crestor, fenofibrate  GERD -PPI  Depression -Continue Lexapro, oxycodone, trazodone at bedtime  Probable sleep apnea -Patient reports snoring, nocturnal awakening.  Noted to have nocturnal hypoxia in the hospital.  He plans to do sleep study as an outpatient.   Mobility: Encourage ambulation Living condition: Lives at home with wife  Goals of care:   Code Status: Full Code  Nutritional status: Body mass index is 31.87 kg/m.     Diet:  Diet Order             Diet Carb Modified Fluid consistency: Thin; Room service appropriate? Yes  Diet effective now                  DVT prophylaxis:  enoxaparin (LOVENOX) injection 40 mg Start: 07/22/21 1400    Antimicrobials: IV Unasyn Fluid: Stop IV fluid today Consultants: ID Family Communication: Wife at bedside  Status is: Inpatient  Remains inpatient appropriate because: Ongoing ID work-up  Dispo: The patient is from: Home              Anticipated d/c is to: Likely home in next 1 to 2 days              Patient currently is not medically stable to d/c.   Difficult to place patient No     Infusions:   piperacillin-tazobactam (ZOSYN)  IV Stopped (07/26/21 0915)    Scheduled Meds:  enoxaparin (LOVENOX) injection  40 mg Subcutaneous Q24H   escitalopram  10 mg Oral Daily   influenza vac split quadrivalent PF  0.5 mL Intramuscular Tomorrow-1000   insulin aspart  0-5 Units Subcutaneous QHS   insulin aspart  0-9 Units Subcutaneous TID WC   insulin glargine-yfgn  10 Units Subcutaneous Daily   ketorolac  30 mg Intravenous Once   lisinopril  5 mg Oral Daily   loratadine  10 mg Oral Daily   pantoprazole  40 mg Oral Daily   rosuvastatin  5 mg Oral Daily    PRN meds: acetaminophen, guaiFENesin, ondansetron **OR** ondansetron (ZOFRAN) IV, sodium chloride, traZODone   Antimicrobials: Anti-infectives (From admission, onward)    Start     Dose/Rate Route Frequency Ordered Stop   07/25/21 1130  piperacillin-tazobactam (ZOSYN) IVPB 3.375 g        3.375 g 12.5 mL/hr over 240 Minutes Intravenous Every 8 hours 07/25/21 1038     07/22/21 1800  Ampicillin-Sulbactam (UNASYN) 3 g in sodium chloride 0.9 % 100 mL IVPB  Status:  Discontinued        3 g 200 mL/hr over 30 Minutes Intravenous Every 6 hours 07/22/21 1704 07/25/21 1025       Objective: Vitals:   07/26/21 0523 07/26/21 0853  BP: 115/78 115/77  Pulse: 92 97  Resp:  16  Temp: 98.6 F (37 C) 99.7 F (37.6 C)  SpO2: 97% 98%    Intake/Output Summary (Last 24 hours) at 07/26/2021 1059 Last data filed at 07/26/2021 1020 Gross per 24 hour  Intake 2128.98 ml  Output --  Net 2128.98 ml   Filed Weights   07/22/21 0952   Weight: 106.6 kg   Weight change:  Body mass index is 31.87 kg/m.   Physical Exam: General exam: Pleasant, middle-aged Caucasian male.  Skin: No rashes, lesions or ulcers. HEENT: Atraumatic, normocephalic, no obvious bleeding Lungs: Clear to auscultation bilaterally CVS: Regular rate and rhythm, no murmur GI/Abd soft, nontender, nondistended, bowel sound present CNS: Alert, awake, oriented x3 Psychiatry: Mood appropriate Extremities: No edema, no calf tenderness  Data Review: I have personally reviewed the laboratory data and studies available.  F/u labs ordered Unresulted Labs (From admission, onward)     Start     Ordered   07/27/21 0500  Lactate dehydrogenase  Tomorrow morning,   R  07/26/21 0840   07/27/21 0500  Rheumatoid factor  Tomorrow morning,   R        07/26/21 0840   07/27/21 0500  CK  Tomorrow morning,   R        07/26/21 0840   07/27/21 0500  Ferritin  Tomorrow morning,   R        07/26/21 0840   07/27/21 0500  CMV IgM  (CMV Antibodies by EIA (PNL))  Tomorrow morning,   R        07/26/21 0840   07/27/21 0500  Cmv antibody, IgG (EIA)  (CMV Antibodies by EIA (PNL))  Tomorrow morning,   R        07/26/21 0840   07/27/21 0500  RPR  Tomorrow morning,   R        07/26/21 0840   07/27/21 0500  Ammonia  Tomorrow morning,   R        07/26/21 1015   07/27/21 0500  Protime-INR  Tomorrow morning,   R        07/26/21 1058   07/27/21 0000  Cryoglobulin  Once,   R        07/26/21 0840   07/26/21 0840  ANA, IFA (with reflex)  Once,   R        07/26/21 0840   07/26/21 0840  Protein electrophoresis, serum  Once,   R        07/26/21 0840   07/26/21 0840  EPSTEIN-BARR VIRUS (EBV) Antibody Profile  Once,   R        07/26/21 0840   07/26/21 0500  CBC with Differential/Platelet  Daily,   R      07/25/21 1029   07/26/21 1747  Basic metabolic panel  Daily,   R      07/25/21 1029            Signed, Terrilee Croak, MD [facility] 07/26/2021

## 2021-07-26 NOTE — Plan of Care (Signed)

## 2021-07-27 ENCOUNTER — Inpatient Hospital Stay (HOSPITAL_COMMUNITY): Payer: BC Managed Care – PPO

## 2021-07-27 DIAGNOSIS — R509 Fever, unspecified: Secondary | ICD-10-CM

## 2021-07-27 LAB — FERRITIN: Ferritin: 310 ng/mL (ref 24–336)

## 2021-07-27 LAB — PROTIME-INR
INR: 1 (ref 0.8–1.2)
Prothrombin Time: 13.6 seconds (ref 11.4–15.2)

## 2021-07-27 LAB — BASIC METABOLIC PANEL
Anion gap: 8 (ref 5–15)
BUN: 9 mg/dL (ref 6–20)
CO2: 25 mmol/L (ref 22–32)
Calcium: 8.5 mg/dL — ABNORMAL LOW (ref 8.9–10.3)
Chloride: 102 mmol/L (ref 98–111)
Creatinine, Ser: 0.81 mg/dL (ref 0.61–1.24)
GFR, Estimated: >60 mL/min (ref >60)
Glucose, Bld: 149 mg/dL — ABNORMAL HIGH (ref 70–99)
Potassium: 3.6 mmol/L (ref 3.5–5.1)
Sodium: 135 mmol/L (ref 135–145)

## 2021-07-27 LAB — CBC WITH DIFFERENTIAL/PLATELET
Abs Immature Granulocytes: 0.12 10*3/uL — ABNORMAL HIGH (ref 0.00–0.07)
Basophils Absolute: 0 10*3/uL (ref 0.0–0.1)
Basophils Relative: 1 %
Eosinophils Absolute: 0.1 10*3/uL (ref 0.0–0.5)
Eosinophils Relative: 2 %
HCT: 31.4 % — ABNORMAL LOW (ref 39.0–52.0)
Hemoglobin: 10.1 g/dL — ABNORMAL LOW (ref 13.0–17.0)
Immature Granulocytes: 2 %
Lymphocytes Relative: 20 %
Lymphs Abs: 1.3 10*3/uL (ref 0.7–4.0)
MCH: 27.1 pg (ref 26.0–34.0)
MCHC: 32.2 g/dL (ref 30.0–36.0)
MCV: 84.2 fL (ref 80.0–100.0)
Monocytes Absolute: 0.5 10*3/uL (ref 0.1–1.0)
Monocytes Relative: 8 %
Neutro Abs: 4.4 10*3/uL (ref 1.7–7.7)
Neutrophils Relative %: 67 %
Platelets: 145 10*3/uL — ABNORMAL LOW (ref 150–400)
RBC: 3.73 MIL/uL — ABNORMAL LOW (ref 4.22–5.81)
RDW: 14.6 % (ref 11.5–15.5)
WBC: 6.5 10*3/uL (ref 4.0–10.5)
nRBC: 0 % (ref 0.0–0.2)

## 2021-07-27 LAB — CULTURE, BLOOD (ROUTINE X 2)
Culture: NO GROWTH
Culture: NO GROWTH
Special Requests: ADEQUATE

## 2021-07-27 LAB — GLUCOSE, CAPILLARY
Glucose-Capillary: 139 mg/dL — ABNORMAL HIGH (ref 70–99)
Glucose-Capillary: 219 mg/dL — ABNORMAL HIGH (ref 70–99)
Glucose-Capillary: 173 mg/dL — ABNORMAL HIGH (ref 70–99)
Glucose-Capillary: 234 mg/dL — ABNORMAL HIGH (ref 70–99)

## 2021-07-27 LAB — CK: Total CK: 154 U/L (ref 49–397)

## 2021-07-27 LAB — LACTATE DEHYDROGENASE: LDH: 108 U/L (ref 98–192)

## 2021-07-27 LAB — AMMONIA: Ammonia: 35 umol/L (ref 9–35)

## 2021-07-27 LAB — RPR: RPR Ser Ql: NONREACTIVE

## 2021-07-27 NOTE — Plan of Care (Signed)

## 2021-07-27 NOTE — Progress Notes (Signed)
PROGRESS NOTE  Omar Travis  DOB: May 23, 1966  PCP: Billie Ruddy, MD [id number]  DOA: 07/22/2021  LOS: 4 days  Hospital Day: 6  Chief Complaint  Patient presents with   Code Sepsis   Brief narrative: Omar Travis is a 55 y.o. male with PMH significant for DM2, HTN, HLD, GERD, depression who was recently hospitalized 10/23-10/26 for cholecystitis, choledocholithiasis and cholangitis Patient presented to the ED at [facility] on 10/31 with anorexia, chills, generalized malaise for 2 to 3 days In the ED, patient met SIRS criteria with fever, tachycardia. CT chest ruled out pulm embolism and any other acute cardiopulmonary process CT abdomen pelvis without any acute abnormalities only changes from recent surgery No source of infection was identified. Patient was started on empiric antibiotic and admitted to hospitalist service. Patient was transferred to [facility] for ID evaluation.  Subjective: Patient was seen and examined this morning. Sitting up at the edge of the bed.  Was getting ready for his lunch.  Not in distress.  Feels much better than at presentation.  Assessment/Plan: SIRS/fever unknown origin --was recently hospitalized 10/23-10/26 for cholecystitis, choledocholithiasis and cholangitis -sp ERCP with a sphincterotomy and stent on 10/25 -sp lap chole and cholecystectomy on 10/26S/p cholecystectomy -Currently has no abdominal pain.  MRCP 11/3 without any postsurgical complication. -CTA chest on admission--no PE, no infiltrates -blood cultures neg -Currently on empiric antibiotics -Patient reports a fever of 101.1 last night.  WBC count remains normal.  Patient feels much better clinically. Recent Labs  Lab 07/21/21 1718 07/22/21 1048 07/22/21 1243 07/23/21 0613 07/24/21 0455 07/26/21 0319 07/27/21 0500  WBC  --  7.4  --  4.3 3.4* 5.4 6.5  LATICACIDVEN  --  1.0 0.9  --   --   --   --   PROCALCITON 67.15  --   --  54.64 34.89  --   --      Fatty liver/early liver cirrhosis -Occasional alcohol user.  Reports history of fatty liver. -Ultrasound abdomen from 11/3 showed cirrhotic changes. -Mildly elevated liver enzymes.  GI follow-up as an outpatient -Ammonia and INR level normal Recent Labs  Lab 07/22/21 1048 07/23/21 0613 07/26/21 0319  AST 28 43* 43*  ALT 44 45* 38  ALKPHOS 115 126 164*  BILITOT 1.6* 1.5* 0.9  PROT 5.7* 5.9* 5.3*  ALBUMIN 2.6* 2.6* 1.9*    Pancytopenia -Unclear why all cell lines were depressed.  No history of malignancy.  Cell counts are gradually improving Recent Labs  Lab 07/22/21 1048 07/23/21 0613 07/24/21 0455 07/26/21 0319 07/27/21 0500  WBC 7.4 4.3 3.4* 5.4 6.5  NEUTROABS 6.8  --   --  3.7 4.4  HGB 10.7* 10.6* 9.8* 10.2* 10.1*  HCT 33.4* 32.8* 31.0* 31.1* 31.4*  MCV 85.9 85.4 86.8 83.4 84.2  PLT 120* 96* 90* 112* 145*    Uncontrolled type 2 diabetes mellitus -A1c 9.2 on 10/17 -Home meds include Ozempic 1 mg weekly, Lantus 56 units daily, Invokana 300 mg daily -Currently oral meds on hold and patient is on sliding scale insulin only.  I will resume Lantus today at 10 units -Continue Accu-Cheks Recent Labs  Lab 07/26/21 1205 07/26/21 1627 07/26/21 2215 07/27/21 0800 07/27/21 1230  GLUCAP 213* 160* 164* 139* 219*    Essential hypertension -Continue lisinopril 5 mg daily.  Hyperlipidemia -Continue Crestor, fenofibrate  GERD -PPI  Depression -Continue Lexapro, oxycodone, trazodone at bedtime  Probable sleep apnea -Patient reports snoring, nocturnal awakening.  Noted to have  nocturnal hypoxia in the hospital.  He plans to do sleep study as an outpatient.   Mobility: Encourage ambulation Living condition: Lives at home with wife Goals of care:   Code Status: Full Code  Nutritional status: Body mass index is 31.87 kg/m.     Diet:  Diet Order             Diet Carb Modified Fluid consistency: Thin; Room service appropriate? Yes  Diet effective now                   DVT prophylaxis:  enoxaparin (LOVENOX) injection 40 mg Start: 07/22/21 1400   Antimicrobials: IV Zosyn Fluid: Stop IV fluid today Consultants: ID Family Communication: Wife at bedside  Status is: Inpatient  Remains inpatient appropriate because: Ongoing ID work-up  Dispo: The patient is from: Home              Anticipated d/c is to: Likely home tomorrow if no more fever              Patient currently is not medically stable to d/c.   Difficult to place patient No     Infusions:   piperacillin-tazobactam (ZOSYN)  IV 12.5 mL/hr at 07/27/21 0616    Scheduled Meds:  enoxaparin (LOVENOX) injection  40 mg Subcutaneous Q24H   escitalopram  10 mg Oral Daily   influenza vac split quadrivalent PF  0.5 mL Intramuscular Tomorrow-1000   insulin aspart  0-5 Units Subcutaneous QHS   insulin aspart  0-9 Units Subcutaneous TID WC   insulin glargine-yfgn  10 Units Subcutaneous Daily   ketorolac  30 mg Intravenous Once   lisinopril  5 mg Oral Daily   loratadine  10 mg Oral Daily   pantoprazole  40 mg Oral Daily   rosuvastatin  5 mg Oral Daily    PRN meds: acetaminophen, guaiFENesin, ondansetron **OR** ondansetron (ZOFRAN) IV, sodium chloride, traZODone   Antimicrobials: Anti-infectives (From admission, onward)    Start     Dose/Rate Route Frequency Ordered Stop   07/25/21 1130  piperacillin-tazobactam (ZOSYN) IVPB 3.375 g        3.375 g 12.5 mL/hr over 240 Minutes Intravenous Every 8 hours 07/25/21 1038     07/22/21 1800  Ampicillin-Sulbactam (UNASYN) 3 g in sodium chloride 0.9 % 100 mL IVPB  Status:  Discontinued        3 g 200 mL/hr over 30 Minutes Intravenous Every 6 hours 07/22/21 1704 07/25/21 1025       Objective: Vitals:   07/27/21 0803 07/27/21 1234  BP: 116/64 114/64  Pulse: 100 96  Resp: 18 18  Temp:  98 F (36.7 C)  SpO2: 96% 95%    Intake/Output Summary (Last 24 hours) at 07/27/2021 1309 Last data filed at 07/27/2021 1243 Gross per 24 hour   Intake 1940.38 ml  Output --  Net 1940.38 ml    Filed Weights   07/22/21 0952  Weight: 106.6 kg   Weight change:  Body mass index is 31.87 kg/m.   Physical Exam: General exam: Pleasant, middle-aged Caucasian male.  Looks fresh Skin: No rashes, lesions or ulcers. HEENT: Atraumatic, normocephalic, no obvious bleeding Lungs: Clear to auscultation bilaterally CVS: Regular rate and rhythm, no murmur GI/Abd soft, nontender, nondistended, bowel sound present CNS: Alert, awake, oriented x3 Psychiatry: Mood appropriate Extremities: No edema, no calf tenderness  Data Review: I have personally reviewed the laboratory data and studies available.  F/u labs ordered Unresulted Labs (From admission, onward)  Start     Ordered   07/27/21 0500  Rheumatoid factor  Tomorrow morning,   R        07/26/21 0840   07/27/21 0500  CMV IgM  (CMV Antibodies by EIA (PNL))  Tomorrow morning,   R        07/26/21 0840   07/27/21 0500  Cmv antibody, IgG (EIA)  (CMV Antibodies by EIA (PNL))  Tomorrow morning,   R        07/26/21 0840   07/27/21 0000  Cryoglobulin  Once,   R        07/26/21 0840   07/26/21 0840  ANA, IFA (with reflex)  Once,   R        07/26/21 0840   07/26/21 0840  Protein electrophoresis, serum  Once,   R        07/26/21 0840   07/26/21 0840  EPSTEIN-BARR VIRUS (EBV) Antibody Profile  Once,   R        07/26/21 0840   07/26/21 0500  CBC with Differential/Platelet  Daily,   R      07/25/21 1029   07/26/21 5800  Basic metabolic panel  Daily,   R      07/25/21 1029            Signed, Terrilee Croak, MD [facility] 07/27/2021

## 2021-07-27 NOTE — Progress Notes (Signed)
VASCULAR LAB    Bilateral upper and lower extremity venous Dopplers have been performed.  See CV proc for preliminary results.   Trinka Keshishyan, RVT 07/27/2021, 9:46 AM

## 2021-07-28 LAB — BASIC METABOLIC PANEL
Anion gap: 4 — ABNORMAL LOW (ref 5–15)
BUN: 8 mg/dL (ref 6–20)
CO2: 26 mmol/L (ref 22–32)
Calcium: 8.2 mg/dL — ABNORMAL LOW (ref 8.9–10.3)
Chloride: 102 mmol/L (ref 98–111)
Creatinine, Ser: 0.72 mg/dL (ref 0.61–1.24)
GFR, Estimated: >60 mL/min (ref >60)
Glucose, Bld: 208 mg/dL — ABNORMAL HIGH (ref 70–99)
Potassium: 3.6 mmol/L (ref 3.5–5.1)
Sodium: 132 mmol/L — ABNORMAL LOW (ref 135–145)

## 2021-07-28 LAB — CBC WITH DIFFERENTIAL/PLATELET
Abs Immature Granulocytes: 0 10*3/uL (ref 0.00–0.07)
Basophils Absolute: 0 10*3/uL (ref 0.0–0.1)
Basophils Relative: 0 %
Eosinophils Absolute: 0.1 10*3/uL (ref 0.0–0.5)
Eosinophils Relative: 1 %
HCT: 30.5 % — ABNORMAL LOW (ref 39.0–52.0)
Hemoglobin: 9.7 g/dL — ABNORMAL LOW (ref 13.0–17.0)
Lymphocytes Relative: 11 %
Lymphs Abs: 0.8 10*3/uL (ref 0.7–4.0)
MCH: 27.2 pg (ref 26.0–34.0)
MCHC: 31.8 g/dL (ref 30.0–36.0)
MCV: 85.4 fL (ref 80.0–100.0)
Monocytes Absolute: 0.4 10*3/uL (ref 0.1–1.0)
Monocytes Relative: 5 %
Neutro Abs: 5.9 10*3/uL (ref 1.7–7.7)
Neutrophils Relative %: 83 %
Platelets: 181 10*3/uL (ref 150–400)
RBC: 3.57 MIL/uL — ABNORMAL LOW (ref 4.22–5.81)
RDW: 14.5 % (ref 11.5–15.5)
WBC: 7.1 10*3/uL (ref 4.0–10.5)
nRBC: 0 % (ref 0.0–0.2)
nRBC: 0 /100 WBC

## 2021-07-28 LAB — GLUCOSE, CAPILLARY
Glucose-Capillary: 217 mg/dL — ABNORMAL HIGH (ref 70–99)
Glucose-Capillary: 147 mg/dL — ABNORMAL HIGH (ref 70–99)
Glucose-Capillary: 120 mg/dL — ABNORMAL HIGH (ref 70–99)
Glucose-Capillary: 143 mg/dL — ABNORMAL HIGH (ref 70–99)

## 2021-07-28 LAB — EPSTEIN-BARR VIRUS (EBV) ANTIBODY PROFILE
EBV NA IgG: <18 U/mL (ref 0.0–17.9)
EBV VCA IgG: 119 U/mL — ABNORMAL HIGH (ref 0.0–17.9)
EBV VCA IgM: <36 U/mL (ref 0.0–35.9)

## 2021-07-28 LAB — ANTINUCLEAR ANTIBODIES, IFA: ANA Ab, IFA: NEGATIVE

## 2021-07-28 LAB — RHEUMATOID FACTOR: Rheumatoid fact SerPl-aCnc: 15.5 IU/mL — ABNORMAL HIGH (ref <14.0)

## 2021-07-28 LAB — CMV IGM: CMV IgM: <30 AU/mL (ref 0.0–29.9)

## 2021-07-28 LAB — CMV ANTIBODY, IGG (EIA): CMV Ab - IgG: 0.88 U/mL — ABNORMAL HIGH (ref 0.00–0.59)

## 2021-07-28 MED ORDER — INSULIN GLARGINE-YFGN 100 UNIT/ML ~~LOC~~ SOLN
20.0000 [IU] | Freq: Every day | SUBCUTANEOUS | Status: DC
  Administered 2021-07-28 – 2021-07-30 (×3): 20 [IU] via SUBCUTANEOUS
  Filled 2021-07-28 (×3): qty 0.2

## 2021-07-28 MED ORDER — CANAGLIFLOZIN 300 MG PO TABS
300.0000 mg | ORAL_TABLET | Freq: Every day | ORAL | Status: DC
  Administered 2021-07-28 – 2021-07-30 (×3): 300 mg via ORAL
  Filled 2021-07-28 (×3): qty 1

## 2021-07-28 NOTE — Progress Notes (Signed)
ID PROGRESS NOTE  If no further fever overnight, recommend to stop pip/tazo tomorrow, observe off of therapy- represents 48hrs since having fevers  [facility] [phone]

## 2021-07-28 NOTE — Progress Notes (Signed)
PROGRESS NOTE  Omar Travis  DOB: [birth year]  PCP: Billie Ruddy, MD [phone]  DOA: 07/22/2021  LOS: 5 days  Hospital Day: 7  Chief Complaint  Patient presents with   Code Sepsis   Brief narrative: Omar Travis is a 55 y.o. male with PMH significant for DM2, HTN, HLD, GERD, depression who was recently hospitalized 10/23-10/26 for cholecystitis, choledocholithiasis and cholangitis Patient presented to the ED at [facility] on 10/31 with anorexia, chills, generalized malaise for 2 to 3 days In the ED, patient met SIRS criteria with fever, tachycardia. CT chest ruled out pulm embolism and any other acute cardiopulmonary process CT abdomen pelvis without any acute abnormalities only changes from recent surgery No source of infection was identified. Patient was started on empiric antibiotic and admitted to hospitalist service. Patient was transferred to [facility] for ID evaluation.  Subjective: Patient was seen and examined this morning. Sitting up in chair. Not in distress. Feels better. No fever in last 24 hrs. ID follow up this afternoon appreciated.  Assessment/Plan: SIRS/fever unknown origin -was recently hospitalized 10/23-10/26 for cholecystitis, choledocholithiasis and cholangitis -sp ERCP with a sphincterotomy and stent on 10/25 -sp lap chole and cholecystectomy on 10/26S/p cholecystectomy. -Postoperative complication was initially suspected to be the source of his fever.  However MRCP 11/3 did not show any postsurgical complication. -CTA chest on admission--no PE, no infiltrates -blood cultures neg -Currently ID following.  On empiric antibiotics No fever last 24 hours.  WC count remains normal.  Patient feels much better clinically. Recent Labs  Lab 07/21/21 1718 07/22/21 1048 07/22/21 1048 07/22/21 1243 07/23/21 0613 07/24/21 0455 07/26/21 0319 07/27/21 0500 07/28/21 0120  WBC  --  7.4   < >  --  4.3 3.4* 5.4 6.5 7.1  LATICACIDVEN   --  1.0  --  0.9  --   --   --   --   --   PROCALCITON 67.15  --   --   --  54.64 34.89  --   --   --    < > = values in this interval not displayed.    Fatty liver/early liver cirrhosis -Occasional alcohol user.  Reports history of fatty liver. -Ultrasound abdomen from 11/3 showed cirrhotic changes. -Mildly elevated liver enzymes.  GI follow-up as an outpatient -Ammonia and INR level normal Recent Labs  Lab 07/22/21 1048 07/23/21 0613 07/26/21 0319  AST 28 43* 43*  ALT 44 45* 38  ALKPHOS 115 126 164*  BILITOT 1.6* 1.5* 0.9  PROT 5.7* 5.9* 5.3*  ALBUMIN 2.6* 2.6* 1.9*    Pancytopenia -Unclear why all cell lines were depressed.  No history of malignancy.  Cell counts are gradually improving Recent Labs  Lab 07/22/21 1048 07/23/21 0613 07/24/21 0455 07/26/21 0319 07/27/21 0500 07/28/21 0120  WBC 7.4 4.3 3.4* 5.4 6.5 7.1  NEUTROABS 6.8  --   --  3.7 4.4 5.9  HGB 10.7* 10.6* 9.8* 10.2* 10.1* 9.7*  HCT 33.4* 32.8* 31.0* 31.1* 31.4* 30.5*  MCV 85.9 85.4 86.8 83.4 84.2 85.4  PLT 120* 96* 90* 112* 145* 181    Uncontrolled type 2 diabetes mellitus -A1c 9.2 on 10/17 -Home meds include Ozempic 1 mg weekly, Lantus 56 units daily, Invokana 300 mg daily.  Patient's wife reports that patient does not follow dietary discretion and loves to eat. -For the first few days in the hospital, he had poor appetite and hence did not require much insulin.  Oral intake improving now  and sugar level rising up as well.  He has been reintroduced on Lantus.  Given 20 units of Lantus this morning.  Invokana resumed as well.  Reports intolerance to metformin in the past.  Follows up with endocrinologist as an outpatient Recent Labs  Lab 07/27/21 0800 07/27/21 1230 07/27/21 1701 07/27/21 2145 07/28/21 1005  GLUCAP 139* 219* 173* 234* 217*    Essential hypertension -Continue lisinopril 5 mg daily.  Hyperlipidemia -Continue Crestor, fenofibrate  GERD -PPI  Depression -Continue Lexapro,  oxycodone, trazodone at bedtime  Probable sleep apnea -Patient reports snoring, nocturnal awakening.  Noted to have nocturnal hypoxia in the hospital.  He plans to do sleep study as an outpatient.   Mobility: Encourage ambulation Living condition: Lives at home with wife Goals of care:   Code Status: Full Code  Nutritional status: Body mass index is 31.87 kg/m.     Diet:  Diet Order             Diet Carb Modified Fluid consistency: Thin; Room service appropriate? Yes  Diet effective now                  DVT prophylaxis:  enoxaparin (LOVENOX) injection 40 mg Start: 07/22/21 1400   Antimicrobials: IV Zosyn Fluid: Stop IV fluid today Consultants: ID Family Communication: Wife at bedside  Status is: Inpatient  Remains inpatient appropriate because: Ongoing ID work-up  Dispo: The patient is from: Home              Anticipated d/c is to: Likely home tomorrow if no more fever              Patient currently is not medically stable to d/c.   Difficult to place patient No     Infusions:   piperacillin-tazobactam (ZOSYN)  IV 3.375 g (07/28/21 0556)    Scheduled Meds:  canagliflozin  300 mg Oral QAC breakfast   enoxaparin (LOVENOX) injection  40 mg Subcutaneous Q24H   escitalopram  10 mg Oral Daily   influenza vac split quadrivalent PF  0.5 mL Intramuscular Tomorrow-1000   insulin aspart  0-5 Units Subcutaneous QHS   insulin aspart  0-9 Units Subcutaneous TID WC   insulin glargine-yfgn  20 Units Subcutaneous Daily   lisinopril  5 mg Oral Daily   loratadine  10 mg Oral Daily   pantoprazole  40 mg Oral Daily   rosuvastatin  5 mg Oral Daily    PRN meds: acetaminophen, guaiFENesin, ondansetron **OR** ondansetron (ZOFRAN) IV, sodium chloride, traZODone   Antimicrobials: Anti-infectives (From admission, onward)    Start     Dose/Rate Route Frequency Ordered Stop   07/25/21 1130  piperacillin-tazobactam (ZOSYN) IVPB 3.375 g        3.375 g 12.5 mL/hr over 240  Minutes Intravenous Every 8 hours 07/25/21 1038     07/22/21 1800  Ampicillin-Sulbactam (UNASYN) 3 g in sodium chloride 0.9 % 100 mL IVPB  Status:  Discontinued        3 g 200 mL/hr over 30 Minutes Intravenous Every 6 hours 07/22/21 1704 07/25/21 1025       Objective: Vitals:   07/28/21 0754 07/28/21 1023  BP: 99/72 103/70  Pulse: 84 83  Resp: 19 15  Temp: 98.2 F (36.8 C) 98 F (36.7 C)  SpO2: 98% 96%    Intake/Output Summary (Last 24 hours) at 07/28/2021 1135 Last data filed at 07/27/2021 1700 Gross per 24 hour  Intake 780 ml  Output --  Net 780 ml  Filed Weights   07/22/21 0952  Weight: 106.6 kg   Weight change:  Body mass index is 31.87 kg/m.   Physical Exam: General exam: Pleasant, middle-aged Caucasian male. Feels good. Looks good. Skin: No rashes, lesions or ulcers. HEENT: Atraumatic, normocephalic, no obvious bleeding Lungs: Clear to auscultation bilaterally CVS: regular rate and rhythm GI/Abd soft, nontender, nondistended, bowel sound present CNS: Alert, awake, oriented x3 Psychiatry: Mood appropriate Extremities: No edema, no calf tenderness  Data Review: I have personally reviewed the laboratory data and studies available.  F/u labs ordered Unresulted Labs (From admission, onward)     Start     Ordered   07/27/21 0500  Rheumatoid factor  Tomorrow morning,   R        07/26/21 0840   07/27/21 0500  CMV IgM  (CMV Antibodies by EIA (PNL))  Tomorrow morning,   R        07/26/21 0840   07/27/21 0500  Cmv antibody, IgG (EIA)  (CMV Antibodies by EIA (PNL))  Tomorrow morning,   R        07/26/21 0840   07/27/21 0000  Cryoglobulin  Once,   R        07/26/21 0840   07/26/21 0840  ANA, IFA (with reflex)  Once,   R        07/26/21 0840   07/26/21 0840  Protein electrophoresis, serum  Once,   R        07/26/21 0840   07/26/21 0840  EPSTEIN-BARR VIRUS (EBV) Antibody Profile  Once,   R        07/26/21 0840            Signed, Terrilee Croak,  MD [facility] 07/28/2021

## 2021-07-28 NOTE — Plan of Care (Signed)

## 2021-07-29 DIAGNOSIS — K8309 Other cholangitis: Secondary | ICD-10-CM | POA: Diagnosis not present

## 2021-07-29 DIAGNOSIS — R509 Fever, unspecified: Secondary | ICD-10-CM | POA: Diagnosis not present

## 2021-07-29 LAB — CULTURE, BLOOD (ROUTINE X 2)
Culture: NO GROWTH
Culture: NO GROWTH

## 2021-07-29 LAB — GLUCOSE, CAPILLARY
Glucose-Capillary: 118 mg/dL — ABNORMAL HIGH (ref 70–99)
Glucose-Capillary: 130 mg/dL — ABNORMAL HIGH (ref 70–99)
Glucose-Capillary: 112 mg/dL — ABNORMAL HIGH (ref 70–99)
Glucose-Capillary: 137 mg/dL — ABNORMAL HIGH (ref 70–99)

## 2021-07-29 NOTE — Progress Notes (Signed)
[facility]  Date of Admission:  07/22/2021      Total days of antibiotics 6   Zosyn day 5         ASSESSMENT: CHADRIC KIMBERLEY is a 55 y.o. male with high fevers presenting within a week of recent lap chole and pancreatic stenting. CT scan and MRCP all unremarkable for explanation of fevers related to recent surgery - that seems to be progressing well.   Fevers = ongoing intermittently in PMs; though not as high. No symptoms associated with these either. Will plan to stop zosyn now without any treatable condition outside of fever. Would encourage journaling of fevers/symptoms so we can continue working with him outpatient to help try to determine a cause.   Scheduled a follow up with Dr. Tommy Medal in a few weeks 12/05.     PLAN: Stop zosyn Outpatient FU scheduled.    Principal Problem:   FUO (fever of unknown origin) Active Problems:   Febrile illness, acute   SIRS (systemic inflammatory response syndrome) (HCC)   Pancytopenia (HCC)   Generalized weakness    canagliflozin  300 mg Oral QAC breakfast   enoxaparin (LOVENOX) injection  40 mg Subcutaneous Q24H   escitalopram  10 mg Oral Daily   influenza vac split quadrivalent PF  0.5 mL Intramuscular Tomorrow-1000   insulin aspart  0-5 Units Subcutaneous QHS   insulin aspart  0-9 Units Subcutaneous TID WC   insulin glargine-yfgn  20 Units Subcutaneous Daily   lisinopril  5 mg Oral Daily   loratadine  10 mg Oral Daily   pantoprazole  40 mg Oral Daily   rosuvastatin  5 mg Oral Daily    SUBJECTIVE: Feeling pretty good - up in the chair eating lunch. No new concerns or symptoms. No fevers for 24h.    Review of Systems: Review of Systems  Constitutional:  Negative for chills and fever.  HENT:  Negative for tinnitus.   Eyes:  Negative for blurred vision and photophobia.  Respiratory:  Negative for cough and sputum production.   Cardiovascular:  Negative for chest pain.   Gastrointestinal:  Positive for abdominal pain (mild soreness from recent surgery). Negative for diarrhea, nausea and vomiting.  Genitourinary:  Negative for dysuria.  Skin:  Negative for rash.  Neurological:  Negative for headaches.   Allergies  Allergen Reactions   Benadryl Allergy [Diphenhydramine Hcl] Rash   Flexeril [Cyclobenzaprine]     Causes "breakout"    OBJECTIVE: Vitals:   07/28/21 1023 07/28/21 1631 07/28/21 2130 07/29/21 0800  BP: 103/70 115/80 112/74   Pulse: 83 93 91   Resp: 15 18 18    Temp: 98 F (36.7 C) 98 F (36.7 C) 99.1 F (37.3 C)   TempSrc: Oral  Oral   SpO2: 96% 99% 98% 94%  Weight:      Height:       Body mass index is 31.87 kg/m.  Physical Exam Vitals reviewed.  Constitutional:      Appearance: He is well-developed.     Comments: Seated comfortably in chair during visit.   HENT:     Mouth/Throat:     Dentition: Normal dentition. No dental abscesses.  Cardiovascular:     Rate and Rhythm: Normal rate and regular rhythm.     Heart sounds: Normal heart sounds.  Pulmonary:     Effort: Pulmonary effort is normal.     Breath sounds: Normal breath sounds.  Abdominal:  General: There is no distension.     Palpations: Abdomen is soft.     Tenderness: There is no abdominal tenderness.     Comments: Dermabonded key hole incisions w/o signs of infection. Mild expected post op tenderness.   Lymphadenopathy:     Cervical: No cervical adenopathy.  Skin:    General: Skin is warm and dry.     Findings: No rash.  Neurological:     Mental Status: He is alert and oriented to person, place, and time.  Psychiatric:        Judgment: Judgment normal.    Lab Results Lab Results  Component Value Date   WBC 7.1 07/28/2021   HGB 9.7 (L) 07/28/2021   HCT 30.5 (L) 07/28/2021   MCV 85.4 07/28/2021   PLT 181 07/28/2021    Lab Results  Component Value Date   CREATININE 0.72 07/28/2021   BUN 8 07/28/2021   NA 132 (L) 07/28/2021   K 3.6 07/28/2021    CL 102 07/28/2021   CO2 26 07/28/2021    Lab Results  Component Value Date   ALT 38 07/26/2021   AST 43 (H) 07/26/2021   ALKPHOS 164 (H) 07/26/2021   BILITOT 0.9 07/26/2021     Microbiology: Recent Results (from the past 240 hour(s))  Blood Culture (routine x 2)     Status: None   Collection Time: 07/22/21 10:49 AM   Specimen: BLOOD RIGHT HAND  Result Value Ref Range Status   Specimen Description BLOOD RIGHT HAND  Final   Special Requests   Final    BOTTLES DRAWN AEROBIC AND ANAEROBIC Blood Culture adequate volume   Culture   Final    NO GROWTH 5 DAYS Performed at [facility], [address] [place], [place] [address]    Report Status 07/27/2021 FINAL  Final  Blood Culture (routine x 2)     Status: None   Collection Time: 07/22/21 10:55 AM   Specimen: BLOOD LEFT HAND  Result Value Ref Range Status   Specimen Description BLOOD LEFT HAND  Final   Special Requests   Final    BOTTLES DRAWN AEROBIC AND ANAEROBIC Blood Culture results may not be optimal due to an excessive volume of blood received in culture bottles   Culture   Final    NO GROWTH 5 DAYS Performed at [facility], [address], [place], [place] [address]    Report Status 07/27/2021 FINAL  Final  Resp Panel by RT-PCR (Flu A&B, Covid) Nasopharyngeal Swab     Status: None   Collection Time: 07/22/21 11:04 AM   Specimen: Nasopharyngeal Swab; Nasopharyngeal(NP) swabs in vial transport medium  Result Value Ref Range Status   SARS Coronavirus 2 by RT PCR NEGATIVE NEGATIVE Final    Comment: (NOTE) SARS-CoV-2 target nucleic acids are NOT DETECTED.  The SARS-CoV-2 RNA is generally detectable in upper respiratory specimens during the acute phase of infection. The lowest concentration of SARS-CoV-2 viral copies this assay can detect is 138 copies/mL. A negative result does not preclude SARS-Cov-2 infection and should not be used as the sole basis for treatment or other patient management decisions. A negative  result may occur with  improper specimen collection/handling, submission of specimen other than nasopharyngeal swab, presence of viral mutation(s) within the areas targeted by this assay, and inadequate number of viral copies(<138 copies/mL). A negative result must be combined with clinical observations, patient history, and epidemiological information. The expected result is Negative.  Fact Sheet for Patients:  EntrepreneurPulse.com.au  Fact Sheet for Healthcare Providers:  IncredibleEmployment.be  This test is no t yet approved or cleared by the [place] FDA and  has been authorized for detection and/or diagnosis of SARS-CoV-2 by FDA under an Emergency Use Authorization (EUA). This EUA will remain  in effect (meaning this test can be used) for the duration of the COVID-19 declaration under Section 564(b)(1) of the Act, 21 U.S.C.section 360bbb-3(b)(1), unless the authorization is terminated  or revoked sooner.       Influenza A by PCR NEGATIVE NEGATIVE Final   Influenza B by PCR NEGATIVE NEGATIVE Final    Comment: (NOTE) The Xpert Xpress SARS-CoV-2/FLU/RSV plus assay is intended as an aid in the diagnosis of influenza from Nasopharyngeal swab specimens and should not be used as a sole basis for treatment. Nasal washings and aspirates are unacceptable for Xpert Xpress SARS-CoV-2/FLU/RSV testing.  Fact Sheet for Patients: EntrepreneurPulse.com.au  Fact Sheet for Healthcare Providers: IncredibleEmployment.be  This test is not yet approved or cleared by the [place] FDA and has been authorized for detection and/or diagnosis of SARS-CoV-2 by FDA under an Emergency Use Authorization (EUA). This EUA will remain in effect (meaning this test can be used) for the duration of the COVID-19 declaration under Section 564(b)(1) of the Act, 21 U.S.C. section 360bbb-3(b)(1), unless the authorization is  terminated or revoked.  Performed at [facility], [address], [place], [place] [address]   Urine Culture     Status: None   Collection Time: 07/22/21  1:21 PM   Specimen: In/Out Cath Urine  Result Value Ref Range Status   Specimen Description   Final    IN/OUT CATH URINE Performed at [facility], [address], [place], [place] [address]    Special Requests   Final    NONE Performed at [facility], [address], [place], [place] [address]    Culture   Final    NO GROWTH Performed at [facility], [place] [address] [place], [place] [address]    Report Status 07/23/2021 FINAL  Final  Culture, blood (Routine X 2) w Reflex to ID Panel     Status: None   Collection Time: 07/24/21 12:32 PM   Specimen: BLOOD LEFT HAND  Result Value Ref Range Status   Specimen Description BLOOD LEFT HAND  Final   Special Requests   Final    BOTTLES DRAWN AEROBIC AND ANAEROBIC Blood Culture results may not be optimal due to an excessive volume of blood received in culture bottles   Culture   Final    NO GROWTH 5 DAYS Performed at [facility], [address], [place], [place] [address]    Report Status 07/29/2021 FINAL  Final  Culture, blood (Routine X 2) w Reflex to ID Panel     Status: None   Collection Time: 07/24/21 12:32 PM   Specimen: Right Antecubital; Blood  Result Value Ref Range Status   Specimen Description RIGHT ANTECUBITAL  Final   Special Requests   Final    BOTTLES DRAWN AEROBIC AND ANAEROBIC Blood Culture results may not be optimal due to an excessive volume of blood received in culture bottles   Culture   Final    NO GROWTH 5 DAYS Performed at [facility], [address], [place], [place] [address]    Report Status 07/29/2021 FINAL  Final  Urine Culture     Status: None   Collection Time: 07/24/21 12:51 PM   Specimen: Urine, Clean Catch  Result Value Ref  Range Status   Specimen Description   Final    URINE, CLEAN CATCH Performed at [facility], [address], [place], [place] [address]    Special Requests   Final    NONE Performed at [facility], [address] [place], [place] [address]    Culture   Final    NO GROWTH Performed at [facility], [place] [address] [place], [place] [address]    Report Status 07/25/2021 FINAL  Final    Janene Madeira, MSN, NP-C [facility] Pager: [phone]  @TODAY @ 11:15 AM

## 2021-07-29 NOTE — Progress Notes (Signed)
  Mobility Specialist Criteria Algorithm Info.  Mobility Team: [facility] elevated:Self regulated Activity: Ambulated in hall (in chair before and after ambulation) Range of motion: Active; All extremities Level of assistance: Independent Assistive device: Other (Comment) (IV Pole) Distance ambulated (ft): 1100 ft Mobility response: Tolerated well Bed Position: Chair  Patient received in recliner chair eager to participate in mobility. Ambulated in hallway independently with steady gait. Returned to room without complaint or incident. Was left in recliner chair with all needs met.   07/29/2021 12:26 PM

## 2021-07-29 NOTE — Progress Notes (Signed)
PROGRESS NOTE  COWAN PILAR  DOB: Jul 29, 1966  PCP: Billie Ruddy, MD [phone]  DOA: 07/22/2021  LOS: 6 days  Hospital Day: 8  Chief Complaint  Patient presents with   Code Sepsis   Brief narrative: Omar Travis is a 55 y.o. male with PMH significant for DM2, HTN, HLD, GERD, depression who was recently hospitalized 10/23-10/26 for cholecystitis, choledocholithiasis and cholangitis Patient presented to the ED at [facility] on 10/31 with anorexia, chills, generalized malaise for 2 to 3 days In the ED, patient met SIRS criteria with fever, tachycardia. CT chest ruled out pulm embolism and any other acute cardiopulmonary process CT abdomen pelvis without any acute abnormalities only changes from recent surgery No source of infection was identified. Patient was started on empiric antibiotic and admitted to hospitalist service. Patient was transferred to [facility] for ID evaluation.  Subjective: Patient was seen and examined this morning.  Lying on bed.  Not in distress.  No new symptoms.  Feels better.  Wife at bedside. ID follow-up appreciated.  Assessment/Plan: SIRS/fever unknown origin -was recently hospitalized 10/23-10/26 for cholecystitis, choledocholithiasis and cholangitis -sp ERCP with a sphincterotomy and stent on 10/25 -sp lap chole and cholecystectomy on 10/26S/p cholecystectomy. -Postoperative complication was initially suspected to be the source of his fever.  However MRCP 11/3 did not show any postsurgical complication. -CTA chest on admission--no PE, no infiltrates -blood cultures neg -Currently ID following.  On IV Zosyn. -No fever last 24 hours.  WBC count remains normal.  Patient feels clinically better.  ID follow-up appreciated.  Noted a plan to monitor off antibiotics. Recent Labs  Lab 07/23/21 0613 07/24/21 0455 07/26/21 0319 07/27/21 0500 07/28/21 0120  WBC 4.3 3.4* 5.4 6.5 7.1  PROCALCITON 54.64 34.89  --   --   --      Fatty liver/early liver cirrhosis -Occasional alcohol user.  Reports history of fatty liver. -Ultrasound abdomen from 11/3 showed cirrhotic changes. -Mildly elevated liver enzymes.  GI follow-up as an outpatient -Ammonia and INR level normal  Pancytopenia -Unclear why all cell lines were depressed.  No history of malignancy.  Cell counts are gradually improved. Recent Labs  Lab 07/23/21 0613 07/24/21 0455 07/26/21 0319 07/27/21 0500 07/28/21 0120  WBC 4.3 3.4* 5.4 6.5 7.1  NEUTROABS  --   --  3.7 4.4 5.9  HGB 10.6* 9.8* 10.2* 10.1* 9.7*  HCT 32.8* 31.0* 31.1* 31.4* 30.5*  MCV 85.4 86.8 83.4 84.2 85.4  PLT 96* 90* 112* 145* 181    Uncontrolled type 2 diabetes mellitus -A1c 9.2 on 10/17 -Home meds include Ozempic 1 mg weekly, Lantus 56 units daily, Invokana 300 mg daily.  Patient's wife reports that patient does not follow dietary discretion and loves to eat. -For the first few days in the hospital, he had poor appetite and hence did not require much insulin.  Oral intake improving now and sugar level rising up as well.  He has been reintroduced on Lantus.  Currently on 20 units of Lantus this morning.  Invokana resumed as well.  Reports intolerance to metformin in the past.  Follows up with endocrinologist as an outpatient Recent Labs  Lab 07/28/21 1143 07/28/21 1629 07/28/21 1952 07/29/21 0730 07/29/21 1133  GLUCAP 147* 120* 143* 118* 130*    Essential hypertension -Continue lisinopril 5 mg daily.  Hyperlipidemia -Continue Crestor, fenofibrate  GERD -PPI  Depression -Continue Lexapro, oxycodone, trazodone at bedtime  Probable sleep apnea -Patient reports snoring, nocturnal awakening.  Noted to  have nocturnal hypoxia in the hospital.  He plans to do sleep study as an outpatient.   Mobility: Encourage ambulation Living condition: Lives at home with wife Goals of care:   Code Status: Full Code  Nutritional status: Body mass index is 31.87 kg/m.     Diet:   Diet Order             Diet Carb Modified Fluid consistency: Thin; Room service appropriate? Yes  Diet effective now                  DVT prophylaxis:  enoxaparin (LOVENOX) injection 40 mg Start: 07/22/21 1400   Antimicrobials: IV Zosyn stopped today Fluid: IV fluids stopped Consultants: ID Family Communication: Wife at bedside  Status is: Inpatient  Remains inpatient appropriate because: Ongoing ID work-up  Dispo: The patient is from: Home              Anticipated d/c is to: Likely home in 1 to 2 days if no fever              Patient currently is not medically stable to d/c.   Difficult to place patient No     Infusions:     Scheduled Meds:  canagliflozin  300 mg Oral QAC breakfast   enoxaparin (LOVENOX) injection  40 mg Subcutaneous Q24H   escitalopram  10 mg Oral Daily   influenza vac split quadrivalent PF  0.5 mL Intramuscular Tomorrow-1000   insulin aspart  0-5 Units Subcutaneous QHS   insulin aspart  0-9 Units Subcutaneous TID WC   insulin glargine-yfgn  20 Units Subcutaneous Daily   lisinopril  5 mg Oral Daily   loratadine  10 mg Oral Daily   pantoprazole  40 mg Oral Daily   rosuvastatin  5 mg Oral Daily    PRN meds: acetaminophen, guaiFENesin, ondansetron **OR** ondansetron (ZOFRAN) IV, sodium chloride, traZODone   Antimicrobials: Anti-infectives (From admission, onward)    Start     Dose/Rate Route Frequency Ordered Stop   07/25/21 1130  piperacillin-tazobactam (ZOSYN) IVPB 3.375 g  Status:  Discontinued        3.375 g 12.5 mL/hr over 240 Minutes Intravenous Every 8 hours 07/25/21 1038 07/29/21 1029   07/22/21 1800  Ampicillin-Sulbactam (UNASYN) 3 g in sodium chloride 0.9 % 100 mL IVPB  Status:  Discontinued        3 g 200 mL/hr over 30 Minutes Intravenous Every 6 hours 07/22/21 1704 07/25/21 1025       Objective: Vitals:   07/29/21 0800 07/29/21 1345  BP:  110/75  Pulse:  91  Resp:  18  Temp:  98.8 F (37.1 C)  SpO2: 94% 95%   No  intake or output data in the 24 hours ending 07/29/21 1346  Filed Weights   07/22/21 0952  Weight: 106.6 kg   Weight change:  Body mass index is 31.87 kg/m.   Physical Exam: General exam: Pleasant, middle-aged Caucasian male. Feels good. Looks good. Skin: No rashes, lesions or ulcers. HEENT: Atraumatic, normocephalic, no obvious bleeding Lungs: Clear to auscultation bilaterally CVS: regular rate and rhythm GI/Abd soft, nontender, nondistended, bowel sound present CNS: Alert, awake, oriented x3 Psychiatry: Mood appropriate Extremities: No edema, no calf tenderness  Data Review: I have personally reviewed the laboratory data and studies available.  F/u labs ordered Unresulted Labs (From admission, onward)     Start     Ordered   07/27/21 0000  Cryoglobulin  Once,   R  07/26/21 0840   07/26/21 0840  Protein electrophoresis, serum  Once,   R        07/26/21 0840            Signed, Terrilee Croak, MD [facility] 07/29/2021

## 2021-07-30 ENCOUNTER — Telehealth: Payer: Self-pay

## 2021-07-30 LAB — PROTEIN ELECTROPHORESIS, SERUM
A/G Ratio: 0.8 (ref 0.7–1.7)
Albumin ELP: 2 g/dL — ABNORMAL LOW (ref 2.9–4.4)
Alpha-1-Globulin: 0.4 g/dL (ref 0.0–0.4)
Alpha-2-Globulin: 0.9 g/dL (ref 0.4–1.0)
Beta Globulin: 0.8 g/dL (ref 0.7–1.3)
Gamma Globulin: 0.5 g/dL (ref 0.4–1.8)
Globulin, Total: 2.6 g/dL (ref 2.2–3.9)
Total Protein ELP: 4.6 g/dL — ABNORMAL LOW (ref 6.0–8.5)

## 2021-07-30 LAB — GLUCOSE, CAPILLARY: Glucose-Capillary: 108 mg/dL — ABNORMAL HIGH (ref 70–99)

## 2021-07-30 MED ORDER — TOUJEO SOLOSTAR 300 UNIT/ML ~~LOC~~ SOPN
20.0000 [IU] | PEN_INJECTOR | Freq: Every day | SUBCUTANEOUS | Status: AC
Start: 2021-07-30 — End: ?

## 2021-07-30 NOTE — Telephone Encounter (Signed)
Transition Care Management Follow-up Telephone Call Date of discharge and from where: 07/30/21 from [facility] How have you been since you were released from the hospital? Pt states he feels pretty good Any questions or concerns? No  Items Reviewed: Did the pt receive and understand the discharge instructions provided? Yes  Medications obtained and verified?  No meds prescribed, but doses were changed Any new allergies since your discharge? No  Dietary orders reviewed? Yes Do you have support at home? Yes   Home Care and Equipment/Supplies: Were home health services ordered? not applicable  Functional Questionnaire: (I = Independent and D = Dependent) ADLs: I  Bathing/Dressing- I  Meal Prep- I  Eating- I  Maintaining continence- I  Transferring/Ambulation- I  Managing Meds- I  Follow up appointments reviewed:  PCP Hospital f/u appt confirmed? Yes  Scheduled to see Dr Volanda Napoleon on 08/07/21 @ 0830. [facility] f/u appt confirmed? Yes  Scheduled to see surgeon on 08/08/21; is making additional f/u appt with ID & GI. Are transportation arrangements needed? No  If their condition worsens, is the pt aware to call PCP or go to the Emergency Dept.? Yes Was the patient provided with contact information for the PCP's office or ED? Yes Was to pt encouraged to call back with questions or concerns? Yes

## 2021-07-30 NOTE — Progress Notes (Signed)
[facility]  Date of Admission:  07/22/2021      Total days of antibiotics 5  Zosyn day 5         ASSESSMENT: Omar Travis is a 55 y.o. male with high fevers presenting within a week of recent lap chole and pancreatic stenting. CT scan and MRCP all unremarkable for explanation of fevers related to recent surgery. Blood cultures negative. No localizing complaints and no evidence of any serious bacterial process. Zosyn was D/C'd after 5 d of treatment. We discussed that all labs that have returned only point [place] to the fact he has non-specific inflammation of some sort. Reviewed EBV/CMV serologies and reveal history of infection, nothing ongoing/reactivated.   Scheduled a follow up with Dr. Tommy Medal in a few weeks 12/05. Encouraged fever monitoring with oral thermometer + symptom journal     PLAN: Outpatient FU scheduled.    Principal Problem:   FUO (fever of unknown origin) Active Problems:   Febrile illness, acute   SIRS (systemic inflammatory response syndrome) (HCC)   Pancytopenia (HCC)   Generalized weakness    canagliflozin  300 mg Oral QAC breakfast   enoxaparin (LOVENOX) injection  40 mg Subcutaneous Q24H   escitalopram  10 mg Oral Daily   insulin aspart  0-5 Units Subcutaneous QHS   insulin aspart  0-9 Units Subcutaneous TID WC   insulin glargine-yfgn  20 Units Subcutaneous Daily   lisinopril  5 mg Oral Daily   loratadine  10 mg Oral Daily   pantoprazole  40 mg Oral Daily   rosuvastatin  5 mg Oral Daily    SUBJECTIVE: Feeling well - ready to go home. His wife accompanies him and has some questions about labs.    Review of Systems: Review of Systems  Constitutional:  Negative for chills and fever.  HENT:  Negative for tinnitus.   Eyes:  Negative for blurred vision and photophobia.  Respiratory:  Negative for cough and sputum production.   Cardiovascular:  Negative for chest pain.  Gastrointestinal:  Positive for  abdominal pain (mild soreness from recent surgery). Negative for diarrhea, nausea and vomiting.  Genitourinary:  Negative for dysuria.  Skin:  Negative for rash.  Neurological:  Negative for headaches.   Allergies  Allergen Reactions   Benadryl Allergy [Diphenhydramine Hcl] Rash   Flexeril [Cyclobenzaprine]     Causes "breakout"    OBJECTIVE: Vitals:   07/29/21 0800 07/29/21 1345 07/29/21 2127 07/30/21 0651  BP:  110/75 114/82   Pulse:  91 87   Resp:  18 19   Temp:  98.8 F (37.1 C) 99 F (37.2 C) 98.2 F (36.8 C)  TempSrc:  Oral Oral   SpO2: 94% 95% 96%   Weight:      Height:       Body mass index is 31.87 kg/m.  Physical Exam Constitutional:      Appearance: Normal appearance. He is not ill-appearing.     Comments: Resting quietly in bed - no distress  HENT:     Head: Normocephalic.     Mouth/Throat:     Mouth: Mucous membranes are moist.     Pharynx: Oropharynx is clear.  Eyes:     General: No scleral icterus. Pulmonary:     Effort: Pulmonary effort is normal.  Musculoskeletal:        General: Normal range of motion.     Cervical back: Normal range of motion.  Skin:  Coloration: Skin is not jaundiced or pale.  Neurological:     Mental Status: He is alert and oriented to person, place, and time.  Psychiatric:        Mood and Affect: Mood normal.        Judgment: Judgment normal.    Lab Results Lab Results  Component Value Date   WBC 7.1 07/28/2021   HGB 9.7 (L) 07/28/2021   HCT 30.5 (L) 07/28/2021   MCV 85.4 07/28/2021   PLT 181 07/28/2021    Lab Results  Component Value Date   CREATININE 0.72 07/28/2021   BUN 8 07/28/2021   NA 132 (L) 07/28/2021   K 3.6 07/28/2021   CL 102 07/28/2021   CO2 26 07/28/2021    Lab Results  Component Value Date   ALT 38 07/26/2021   AST 43 (H) 07/26/2021   ALKPHOS 164 (H) 07/26/2021   BILITOT 0.9 07/26/2021     Microbiology: Recent Results (from the past 240 hour(s))  Blood Culture (routine x 2)      Status: None   Collection Time: 07/22/21 10:49 AM   Specimen: BLOOD RIGHT HAND  Result Value Ref Range Status   Specimen Description BLOOD RIGHT HAND  Final   Special Requests   Final    BOTTLES DRAWN AEROBIC AND ANAEROBIC Blood Culture adequate volume   Culture   Final    NO GROWTH 5 DAYS Performed at [facility], [address] [place], [place] [address]    Report Status 07/27/2021 FINAL  Final  Blood Culture (routine x 2)     Status: None   Collection Time: 07/22/21 10:55 AM   Specimen: BLOOD LEFT HAND  Result Value Ref Range Status   Specimen Description BLOOD LEFT HAND  Final   Special Requests   Final    BOTTLES DRAWN AEROBIC AND ANAEROBIC Blood Culture results may not be optimal due to an excessive volume of blood received in culture bottles   Culture   Final    NO GROWTH 5 DAYS Performed at [facility], [address], [place], [place] [address]    Report Status 07/27/2021 FINAL  Final  Resp Panel by RT-PCR (Flu A&B, Covid) Nasopharyngeal Swab     Status: None   Collection Time: 07/22/21 11:04 AM   Specimen: Nasopharyngeal Swab; Nasopharyngeal(NP) swabs in vial transport medium  Result Value Ref Range Status   SARS Coronavirus 2 by RT PCR NEGATIVE NEGATIVE Final    Comment: (NOTE) SARS-CoV-2 target nucleic acids are NOT DETECTED.  The SARS-CoV-2 RNA is generally detectable in upper respiratory specimens during the acute phase of infection. The lowest concentration of SARS-CoV-2 viral copies this assay can detect is 138 copies/mL. A negative result does not preclude SARS-Cov-2 infection and should not be used as the sole basis for treatment or other patient management decisions. A negative result may occur with  improper specimen collection/handling, submission of specimen other than nasopharyngeal swab, presence of viral mutation(s) within the areas targeted by this assay, and inadequate number of viral copies(<138 copies/mL). A negative result must be combined  with clinical observations, patient history, and epidemiological information. The expected result is Negative.  Fact Sheet for Patients:  EntrepreneurPulse.com.au  Fact Sheet for Healthcare Providers:  IncredibleEmployment.be  This test is no t yet approved or cleared by the [place] FDA and  has been authorized for detection and/or diagnosis of SARS-CoV-2 by FDA under an Emergency Use Authorization (EUA). This EUA will remain  in effect (meaning this  test can be used) for the duration of the COVID-19 declaration under Section 564(b)(1) of the Act, 21 U.S.C.section 360bbb-3(b)(1), unless the authorization is terminated  or revoked sooner.       Influenza A by PCR NEGATIVE NEGATIVE Final   Influenza B by PCR NEGATIVE NEGATIVE Final    Comment: (NOTE) The Xpert Xpress SARS-CoV-2/FLU/RSV plus assay is intended as an aid in the diagnosis of influenza from Nasopharyngeal swab specimens and should not be used as a sole basis for treatment. Nasal washings and aspirates are unacceptable for Xpert Xpress SARS-CoV-2/FLU/RSV testing.  Fact Sheet for Patients: EntrepreneurPulse.com.au  Fact Sheet for Healthcare Providers: IncredibleEmployment.be  This test is not yet approved or cleared by the [place] FDA and has been authorized for detection and/or diagnosis of SARS-CoV-2 by FDA under an Emergency Use Authorization (EUA). This EUA will remain in effect (meaning this test can be used) for the duration of the COVID-19 declaration under Section 564(b)(1) of the Act, 21 U.S.C. section 360bbb-3(b)(1), unless the authorization is terminated or revoked.  Performed at [facility], [address], [place], [place] [address]   Urine Culture     Status: None   Collection Time: 07/22/21  1:21 PM   Specimen: In/Out Cath Urine  Result Value Ref Range Status   Specimen Description   Final    IN/OUT CATH  URINE Performed at [facility], [address] [place], [place] [address]    Special Requests   Final    NONE Performed at [facility] [address], [place], [place] [address]    Culture   Final    NO GROWTH Performed at [facility], [place] [address] [place], [place] [address]    Report Status 07/23/2021 FINAL  Final  Culture, blood (Routine X 2) w Reflex to ID Panel     Status: None   Collection Time: 07/24/21 12:32 PM   Specimen: BLOOD LEFT HAND  Result Value Ref Range Status   Specimen Description BLOOD LEFT HAND  Final   Special Requests   Final    BOTTLES DRAWN AEROBIC AND ANAEROBIC Blood Culture results may not be optimal due to an excessive volume of blood received in culture bottles   Culture   Final    NO GROWTH 5 DAYS Performed at [facility], [address] [place], [place] [address]    Report Status 07/29/2021 FINAL  Final  Culture, blood (Routine X 2) w Reflex to ID Panel     Status: None   Collection Time: 07/24/21 12:32 PM   Specimen: Right Antecubital; Blood  Result Value Ref Range Status   Specimen Description RIGHT ANTECUBITAL  Final   Special Requests   Final    BOTTLES DRAWN AEROBIC AND ANAEROBIC Blood Culture results may not be optimal due to an excessive volume of blood received in culture bottles   Culture   Final    NO GROWTH 5 DAYS Performed at [facility], [address] [place], [place] [address]    Report Status 07/29/2021 FINAL  Final  Urine Culture     Status: None   Collection Time: 07/24/21 12:51 PM   Specimen: Urine, Clean Catch  Result Value Ref Range Status   Specimen Description   Final    URINE, CLEAN CATCH Performed at [facility], [address], [place], [place] [address]    Special Requests   Final    NONE Performed at [facility], [address], [place],  [address]  Culture   Final    NO GROWTH Performed at [facility], [place] [address] [place], [place] [address]    Report Status 07/25/2021 FINAL   Final    Janene Madeira, MSN, NP-C [facility] [facility] Pager: [phone]  @TODAY @ 11:55 AM

## 2021-07-30 NOTE — Progress Notes (Signed)
Patient given discharge instructions.  Patient and spouse verbalize understanding of follow up appointments and medications. Flu vaccine given. IV d/c'd with bleeding controlled.   Wife to transport patient home.

## 2021-07-30 NOTE — Plan of Care (Signed)

## 2021-07-30 NOTE — Discharge Summary (Signed)
Physician Discharge Summary  Omar Travis HQP:[id number] DOB: [birth year] DOA: 07/22/2021  PCP: Billie Ruddy, MD  Admit date: 07/22/2021 Discharge date: 07/30/2021  Admitted From: Home Discharge disposition: Home   Code Status: Full Code   Discharge Diagnosis:   Principal Problem:   FUO (fever of unknown origin) Active Problems:   Febrile illness, acute   SIRS (systemic inflammatory response syndrome) (Omar Travis)   Pancytopenia (Omar Travis)   Generalized weakness   Chief Complaint  Patient presents with   Code Sepsis   Brief narrative: Omar Travis is a 55 y.o. male with PMH significant for DM2, HTN, HLD, GERD, depression who was recently hospitalized 10/23-10/26 for cholecystitis, choledocholithiasis and cholangitis Patient presented to the ED at [facility] on 10/31 with anorexia, chills, generalized malaise for 2 to 3 days In the ED, patient met SIRS criteria with fever, tachycardia. CT chest ruled out pulm embolism and any other acute cardiopulmonary process CT abdomen pelvis without any acute abnormalities only changes from recent surgery No source of infection was identified. Patient was started on empiric antibiotic and admitted to hospitalist service. Patient was transferred to [facility] for ID evaluation.  Subjective: Patient was seen and examined this morning.  Lying down in bed.  Not in distress.  Wife at bedside.  ID team was in his room as well.  Feels ready to go home today.    Hospital course SIRS/fever unknown origin -was recently hospitalized 10/23-10/26 for cholecystitis, choledocholithiasis and cholangitis -sp ERCP with a sphincterotomy and stent on 10/25 -sp lap chole and cholecystectomy on 10/26S/p cholecystectomy. -Postoperative complication was initially suspected to be the source of his fever.  However MRCP 11/3 did not show any postsurgical complication. -CTA chest on admission--no PE, no infiltrates -blood cultures neg -Currently ID  following.  Empirically treated with IV Zosyn. -No fever in last 48 hours hours.  WBC count remains normal.  Patient feels clinically better.  ID follow-up appreciated.  No antibiotics at discharge. Recent Labs  Lab 07/24/21 0455 07/26/21 0319 07/27/21 0500 07/28/21 0120  WBC 3.4* 5.4 6.5 7.1  PROCALCITON 34.89  --   --   --    Fatty liver/early liver cirrhosis -Occasional alcohol user.  Reports history of fatty liver. -Ultrasound abdomen from 11/3 showed cirrhotic changes. -Mildly elevated liver enzymes.  GI follow-up as an outpatient -Ammonia and INR level normal  Pancytopenia -Unclear why all cell lines were depressed.  No history of malignancy.  Cell counts are gradually improved. Recent Labs  Lab 07/24/21 0455 07/26/21 0319 07/27/21 0500 07/28/21 0120  WBC 3.4* 5.4 6.5 7.1  NEUTROABS  --  3.7 4.4 5.9  HGB 9.8* 10.2* 10.1* 9.7*  HCT 31.0* 31.1* 31.4* 30.5*  MCV 86.8 83.4 84.2 85.4  PLT 90* 112* 145* 181   Uncontrolled type 2 diabetes mellitus -A1c 9.2 on 10/17 -Home meds include Ozempic 1 mg weekly, Lantus 56 units daily, Invokana 300 mg daily.  Patient's wife reports that patient does not follow dietary discretion and loves to eat. -For the first few days in the hospital, he had poor appetite and hence did not require much insulin.  Oral intake improved eventually and his blood sugar started to rise.  He was reintroduced on Lantus, currently getting 20 units daily.  Invokana has been resumed as well.  Patient reports intolerance to metformin in the past.  Follows up with endocrinologist as an outpatient Recent Labs  Lab 07/29/21 0730 07/29/21 1133 07/29/21 1628 07/29/21 2129 07/30/21 0825  GLUCAP 118* 130* 112* 137* 108*   Essential hypertension -Continue lisinopril 5 mg daily.  Hyperlipidemia -Continue Crestor, fenofibrate  GERD -PPI  Depression -Continue Lexapro, oxycodone, trazodone at bedtime  Probable sleep apnea -Patient reports snoring, nocturnal  awakening.  Noted to have nocturnal hypoxia in the hospital.  He plans to do sleep study as an outpatient.   Allergies as of 07/30/2021       Reactions   Benadryl Allergy [diphenhydramine Hcl] Rash   Flexeril [cyclobenzaprine]    Causes "breakout"        Medication List     STOP taking these medications    senna 8.6 MG Tabs tablet Commonly known as: SENOKOT       TAKE these medications    canagliflozin 300 MG Tabs tablet Commonly known as: INVOKANA Take 300 mg by mouth daily before breakfast.   dicyclomine 10 MG capsule Commonly known as: BENTYL Take 10 mg by mouth 3 (three) times daily as needed for spasms.   escitalopram 10 MG tablet Commonly known as: LEXAPRO TAKE ONE (1) TABLET EACH DAY What changed: See the new instructions.   fenofibrate 145 MG tablet Commonly known as: TRICOR Take 145 mg by mouth daily.   lisinopril 5 MG tablet Commonly known as: ZESTRIL TAKE ONE (1) TABLET EACH DAY What changed: See the new instructions.   omeprazole 20 MG capsule Commonly known as: PRILOSEC Take 1 capsule (20 mg total) by mouth daily.   ondansetron 8 MG disintegrating tablet Commonly known as: ZOFRAN-ODT Take 8 mg by mouth every 8 (eight) hours as needed for nausea.   oxyCODONE 5 MG immediate release tablet Commonly known as: Oxy IR/ROXICODONE Take 1 tablet (5 mg total) by mouth every 6 (six) hours as needed for moderate pain or severe pain (not relieved by tylenol or ibuprofen.).   OZEMPIC (1 MG/DOSE) Purdin Inject 1 mg into the skin once a week.   rosuvastatin 5 MG tablet Commonly known as: CRESTOR Take 5 mg by mouth daily.   Toujeo SoloStar 300 UNIT/ML Solostar Pen Generic drug: insulin glargine (1 Unit Dial) Inject 20 Units into the skin daily. What changed: how much to take        Discharge Instructions:  Diet Recommendation:  Discharge Diet Orders (From admission, onward)     Start     Ordered   07/30/21 0000  Diet Carb Modified         07/30/21 1002              '@BRDDSCINSTRUCTIONS' @  Follow ups:    Follow-up Information     Billie Ruddy, MD Follow up.   Specialty: Family Medicine Contact information: [address] [place] [address] [phone]         Tommy Medal, Lavell Islam, MD Follow up on 08/26/2021.   Specialty: Infectious Diseases Why: 11:15 am appointment Contact information: [address] [address] [phone]         Billie Ruddy, MD Follow up.   Specialty: Family Medicine Contact information: [place] [address] [phone]                 Wound care:   Incision - 4 Ports Abdomen Umbilicus Mid;Upper Right;Upper Right;Lower (Active)  Placement Date/Time: 07/17/21 1041   Location of Ports: Abdomen  Location Orientation: Umbilicus  Location Orientation: Mid;Upper  Location Orientation: Right;Upper  Location Orientation: Right;Lower    Assessments 07/17/2021 10:40 AM 07/29/2021  8:03 PM  Port  1 Site Assessment -- Clean;Dry  Port 1 Dressing Type Liquid skin adhesive Liquid skin adhesive  Port 2 Site Assessment -- Clean;Dry  Port 2 Dressing Type Liquid skin adhesive Liquid skin adhesive  Port 3 Site Assessment -- Clean;Dry  Port 3 Dressing Type Liquid skin adhesive Liquid skin adhesive  Port 4 Site Assessment -- Clean;Dry  Port 4 Dressing Type Liquid skin adhesive Liquid skin adhesive     No Linked orders to display    Discharge Exam:   Vitals:   07/29/21 0800 07/29/21 1345 07/29/21 2127 07/30/21 0651  BP:  110/75 114/82   Pulse:  91 87   Resp:  18 19   Temp:  98.8 F (37.1 C) 99 F (37.2 C) 98.2 F (36.8 C)  TempSrc:  Oral Oral   SpO2: 94% 95% 96%   Weight:      Height:        Body mass index is 31.87 kg/m.  General exam: Pleasant, middle-aged.  Not in distress Skin: No rashes, lesions or ulcers. HEENT: Atraumatic, normocephalic, no obvious bleeding Lungs: Clear to auscultation  bilaterally CVS: Regular rate and rhythm, no murmur GI/Abd soft, nontender, nondistended, bowel sound present CNS: Alert, awake, oriented x3 Psychiatry: Mood appropriate Extremities: No pedal edema, no calf tenderness  Time coordinating discharge: 35 minutes   The results of significant diagnostics from this hospitalization (including imaging, microbiology, ancillary and laboratory) are listed below for reference.    Procedures and Diagnostic Studies:   CT Angio Chest PE W and/or Wo Contrast  Result Date: 07/22/2021 CLINICAL DATA:  Diaphoresis, fever, recent gallbladder surgery, code sepsis, high clinical suspicion of pulmonary embolism EXAM: CT ANGIOGRAPHY CHEST WITH CONTRAST TECHNIQUE: Multidetector CT imaging of the chest was performed using the standard protocol during bolus administration of intravenous contrast. Multiplanar CT image reconstructions and MIPs were obtained to evaluate the vascular anatomy. CONTRAST:  69m OMNIPAQUE IOHEXOL 350 MG/ML SOLN IV COMPARISON:  None FINDINGS: Cardiovascular: Heart unremarkable. No pericardial effusion. Aorta normal caliber without aneurysm or dissection. Pulmonary arteries adequately opacified. Scattered respiratory motion artifacts in lower lobes. No pulmonary emboli identified. Mediastinum/Nodes: Esophagus unremarkable. No thoracic adenopathy. Base of cervical region normal appearance. Lungs/Pleura: Minimal dependent atelectasis posterior lower lobes. Lungs otherwise clear. No pulmonary infiltrate, pleural effusion, or pneumothorax. 2 mm RIGHT lower lobe nodule image 68. Questionable tiny nodule versus scar 3 mm diameter RIGHT middle lobe image 65. 3 mm subpleural nodular density LEFT lower lobe image 105. Upper Abdomen: Chronic atrophy and intrahepatic bilary dilatation RIGHT lobe liver medially unchanged from prior exams. Few foci of gas at porta hepatis, likely external to biliary tree, suspect related to recent ERCP. Minimal bilary air LEFT lobe  liver post ERCP. No other upper abdominal free air. Upper normal splenic size. Musculoskeletal: Unremarkable. Review of the MIP images confirms the above findings. IMPRESSION: No evidence of pulmonary embolism. No definite acute intrathoracic abnormalities. Few foci of gas at porta hepatis, suspect related to recent ERCP. Few tiny lung nodules; no follow-up needed if patient is low-risk (and has no known or suspected primary neoplasm). Non-contrast chest CT can be considered in 12 months if patient is high-risk. This recommendation follows the consensus statement: Guidelines for Management of Incidental Pulmonary Nodules Detected on CT Images: From the Fleischner Society 2017; Radiology 2017; 284:228-243. Aortic Atherosclerosis (ICD10-I70.0). Findings discussed with Dr. LLaverta Baltimoreon 07/22/2021 at 1455 hours. Electronically Signed   By: MLavonia DanaM.D.   On: 07/22/2021 14:57   CT ABDOMEN PELVIS W CONTRAST  Result Date: 07/22/2021 CLINICAL DATA:  Fever. ERCP and cholecystectomy 5/6 days ago. Recent therapy of antibiotics. Chills. Nausea. Diabetes. Prior pancreatic stent. Sphincterotomy. EXAM: CT ABDOMEN AND PELVIS WITH CONTRAST TECHNIQUE: Multidetector CT imaging of the abdomen and pelvis was performed using the standard protocol following bolus administration of intravenous contrast. CONTRAST:  175m OMNIPAQUE IOHEXOL 300 MG/ML  SOLN COMPARISON:  07/14/2021 abdominopelvic CT.  MRI of 07/14/2021 FINDINGS: Lower chest: 3 mm left lower lobe pulmonary nodule on 07/05 similar to on the recent exam and not described on the report of 04/28/2014. Images not available. Normal heart size without pericardial or pleural effusion. Hepatobiliary: Subtly irregular hepatic capsule with prominence of the lateral segment left lobe. Chronic marked biliary duct dilatation involving segment 6 and the caudate lobe. Trace pneumobilia within the left hepatic lobe which is likely iatrogenic. Interval cholecystectomy. No common duct  dilatation or evidence of choledocholithiasis. No fluid collection within the operative bed. Mild pericholecystic edema and trace air are expected in the postoperative setting. Pancreas: Normal, without mass or ductal dilatation. Spleen: Splenomegaly at 15.6 cm craniocaudal. Maximal transverse 12.8 x 6.6 cm. (volume = 690). Adrenals/Urinary Tract: Normal adrenal glands. Left greater than right renal sinus cysts. No hydronephrosis. Normal urinary bladder. Stomach/Bowel: Normal stomach, without wall thickening. Nonspecific submucosal fat prominence involving the ascending colon. Normal terminal ileum and appendix. Normal small bowel. Vascular/Lymphatic: Normal caliber of the aorta and branch vessels. Patent portal and splenic veins. Mildly prominent veins in the splenic hilum. No abdominopelvic adenopathy. small nodes within the jejunal mesentery with increased density in the mesenteric fat on 44/2. No pelvic sidewall adenopathy. Reproductive: Normal prostate. Other: No free intraperitoneal air.  No free fluid. Musculoskeletal: No acute osseous abnormality. IMPRESSION: 1. Expected appearance after cholecystectomy, without postoperative fluid collection, common duct dilatation, or choledocholithiasis. 2. Chronic marked intrahepatic biliary duct dilatation involving the right and caudate lobes, similar back to 2018 and by report back to 2015. This has been evaluated on multiple prior imaging studies, possibly the sequelae of remote insult. 3. Suspicion of mild cirrhosis with splenomegaly and possible portal venous hypertension. 4. Jejunal mesenteric findings which suggest adenitis/panniculitis, of indeterminate clinical significance. Can be seen in asymptomatic individuals. Present back to 08/19/2017. 5. 3 mm left lower lobe pulmonary nodule. No follow-up needed if patient is low-risk. Non-contrast chest CT can be considered in 12 months if patient is high-risk. This recommendation follows the consensus statement:  Guidelines for Management of Incidental Pulmonary Nodules Detected on CT Images: From the Fleischner Society 2017; Radiology 2017; 284:228-243. Electronically Signed   By: KAbigail MiyamotoM.D.   On: 07/22/2021 12:36   DG Chest Port 1 View  Result Date: 07/22/2021 CLINICAL DATA:  55year old male with possible sepsis. EXAM: PORTABLE CHEST 1 VIEW COMPARISON:  No priors. FINDINGS: Lung volumes are low. No consolidative airspace disease. No pleural effusions. No pneumothorax. No pulmonary nodule or mass noted. Pulmonary vasculature and the cardiomediastinal silhouette are within normal limits. IMPRESSION: 1. Low lung volumes without radiographic evidence of acute cardiopulmonary disease. Electronically Signed   By: DVinnie LangtonM.D.   On: 07/22/2021 10:22     Labs:   Basic Metabolic Panel: Recent Labs  Lab 07/24/21 0455 07/26/21 0319 07/27/21 0500 07/28/21 0120  NA 133* 134* 135 132*  K 4.1 3.7 3.6 3.6  CL 105 102 102 102  CO2 '24 23 25 26  ' GLUCOSE 114* 158* 149* 208*  BUN '9 8 9 8  ' CREATININE 0.76 0.76 0.81 0.72  CALCIUM 8.0* 8.3*  8.5* 8.2*   GFR Estimated Creatinine Clearance: 136.3 mL/min (by C-G formula based on SCr of 0.72 mg/dL). Liver Function Tests: Recent Labs  Lab 07/26/21 0319  AST 43*  ALT 38  ALKPHOS 164*  BILITOT 0.9  PROT 5.3*  ALBUMIN 1.9*   No results for input(s): LIPASE, AMYLASE in the last 168 hours. Recent Labs  Lab 07/27/21 0500  AMMONIA 35   Coagulation profile Recent Labs  Lab 07/26/21 0319 07/27/21 0500  INR 1.0 1.0    CBC: Recent Labs  Lab 07/24/21 0455 07/26/21 0319 07/27/21 0500 07/28/21 0120  WBC 3.4* 5.4 6.5 7.1  NEUTROABS  --  3.7 4.4 5.9  HGB 9.8* 10.2* 10.1* 9.7*  HCT 31.0* 31.1* 31.4* 30.5*  MCV 86.8 83.4 84.2 85.4  PLT 90* 112* 145* 181   Cardiac Enzymes: Recent Labs  Lab 07/27/21 0500  CKTOTAL 154   BNP: Invalid input(s): POCBNP CBG: Recent Labs  Lab 07/29/21 0730 07/29/21 1133 07/29/21 1628 07/29/21 2129  07/30/21 0825  GLUCAP 118* 130* 112* 137* 108*   D-Dimer No results for input(s): DDIMER in the last 72 hours. Hgb A1c No results for input(s): HGBA1C in the last 72 hours. Lipid Profile No results for input(s): CHOL, HDL, LDLCALC, TRIG, CHOLHDL, LDLDIRECT in the last 72 hours. Thyroid function studies No results for input(s): TSH, T4TOTAL, T3FREE, THYROIDAB in the last 72 hours.  Invalid input(s): FREET3 Anemia work up No results for input(s): VITAMINB12, FOLATE, FERRITIN, TIBC, IRON, RETICCTPCT in the last 72 hours. Microbiology Recent Results (from the past 240 hour(s))  Blood Culture (routine x 2)     Status: None   Collection Time: 07/22/21 10:49 AM   Specimen: BLOOD RIGHT HAND  Result Value Ref Range Status   Specimen Description BLOOD RIGHT HAND  Final   Special Requests   Final    BOTTLES DRAWN AEROBIC AND ANAEROBIC Blood Culture adequate volume   Culture   Final    NO GROWTH 5 DAYS Performed at [facility], [address], [place], [place] [address]    Report Status 07/27/2021 FINAL  Final  Blood Culture (routine x 2)     Status: None   Collection Time: 07/22/21 10:55 AM   Specimen: BLOOD LEFT HAND  Result Value Ref Range Status   Specimen Description BLOOD LEFT HAND  Final   Special Requests   Final    BOTTLES DRAWN AEROBIC AND ANAEROBIC Blood Culture results may not be optimal due to an excessive volume of blood received in culture bottles   Culture   Final    NO GROWTH 5 DAYS Performed at [facility], [address], [place], [place] [address]    Report Status 07/27/2021 FINAL  Final  Resp Panel by RT-PCR (Flu A&B, Covid) Nasopharyngeal Swab     Status: None   Collection Time: 07/22/21 11:04 AM   Specimen: Nasopharyngeal Swab; Nasopharyngeal(NP) swabs in vial transport medium  Result Value Ref Range Status   SARS Coronavirus 2 by RT PCR NEGATIVE NEGATIVE Final    Comment: (NOTE) SARS-CoV-2 target nucleic acids are NOT DETECTED.  The SARS-CoV-2 RNA is  generally detectable in upper respiratory specimens during the acute phase of infection. The lowest concentration of SARS-CoV-2 viral copies this assay can detect is 138 copies/mL. A negative result does not preclude SARS-Cov-2 infection and should not be used as the sole basis for treatment or other patient management decisions. A negative result may occur with  improper specimen collection/handling, submission of specimen other than  nasopharyngeal swab, presence of viral mutation(s) within the areas targeted by this assay, and inadequate number of viral copies(<138 copies/mL). A negative result must be combined with clinical observations, patient history, and epidemiological information. The expected result is Negative.  Fact Sheet for Patients:  EntrepreneurPulse.com.au  Fact Sheet for Healthcare Providers:  IncredibleEmployment.be  This test is no t yet approved or cleared by the [place] FDA and  has been authorized for detection and/or diagnosis of SARS-CoV-2 by FDA under an Emergency Use Authorization (EUA). This EUA will remain  in effect (meaning this test can be used) for the duration of the COVID-19 declaration under Section 564(b)(1) of the Act, 21 U.S.C.section 360bbb-3(b)(1), unless the authorization is terminated  or revoked sooner.       Influenza A by PCR NEGATIVE NEGATIVE Final   Influenza B by PCR NEGATIVE NEGATIVE Final    Comment: (NOTE) The Xpert Xpress SARS-CoV-2/FLU/RSV plus assay is intended as an aid in the diagnosis of influenza from Nasopharyngeal swab specimens and should not be used as a sole basis for treatment. Nasal washings and aspirates are unacceptable for Xpert Xpress SARS-CoV-2/FLU/RSV testing.  Fact Sheet for Patients: EntrepreneurPulse.com.au  Fact Sheet for Healthcare Providers: IncredibleEmployment.be  This test is not yet approved or cleared by the [place] FDA and has been authorized for detection and/or diagnosis of SARS-CoV-2 by FDA under an Emergency Use Authorization (EUA). This EUA will remain in effect (meaning this test can be used) for the duration of the COVID-19 declaration under Section 564(b)(1) of the Act, 21 U.S.C. section 360bbb-3(b)(1), unless the authorization is terminated or revoked.  Performed at [facility], [address] [place], [place] [address]   Urine Culture     Status: None   Collection Time: 07/22/21  1:21 PM   Specimen: In/Out Cath Urine  Result Value Ref Range Status   Specimen Description   Final    IN/OUT CATH URINE Performed at [facility], [address] [place], [place] [address]    Special Requests   Final    NONE Performed at [facility], [address] [place], [place] [address]    Culture   Final    NO GROWTH Performed at [facility], [place] [address], [place],  [address]    Report Status 07/23/2021 FINAL  Final  Culture, blood (Routine X 2) w Reflex to ID Panel     Status: None   Collection Time: 07/24/21 12:32 PM   Specimen: BLOOD LEFT HAND  Result Value Ref Range Status   Specimen Description BLOOD LEFT HAND  Final   Special Requests   Final    BOTTLES DRAWN AEROBIC AND ANAEROBIC Blood Culture results may not be optimal due to an excessive volume of blood received in culture bottles   Culture   Final    NO GROWTH 5 DAYS Performed at [facility], [address] [place],  [address]    Report Status 07/29/2021 FINAL  Final  Culture, blood (Routine X 2) w Reflex to ID Panel     Status: None   Collection Time: 07/24/21 12:32 PM   Specimen: Right Antecubital; Blood  Result Value Ref Range Status   Specimen Description RIGHT ANTECUBITAL  Final   Special Requests   Final    BOTTLES DRAWN AEROBIC AND ANAEROBIC Blood Culture results may not be optimal due to an excessive volume of blood received in culture bottles   Culture   Final    NO GROWTH 5  DAYS Performed at [facility], [address] [place], [place] [address]    Report Status 07/29/2021 FINAL  Final  Urine Culture     Status: None   Collection Time: 07/24/21 12:51 PM   Specimen: Urine, Clean Catch  Result Value Ref Range Status   Specimen Description   Final    URINE, CLEAN CATCH Performed at [facility], [address], [place], [place] [address]    Special Requests   Final    NONE Performed at [facility], [address], [place], [place] [address]    Culture   Final    NO GROWTH Performed at [facility], [place] [address] [place], [place] [address]    Report Status 07/25/2021 FINAL  Final     Signed: Marlowe Aschoff Inell Mimbs  [facility] 07/30/2021, 10:03 AM

## 2021-07-31 DIAGNOSIS — E1169 Type 2 diabetes mellitus with other specified complication: Secondary | ICD-10-CM | POA: Diagnosis not present

## 2021-07-31 DIAGNOSIS — Z91199 Patient's noncompliance with other medical treatment and regimen due to unspecified reason: Secondary | ICD-10-CM | POA: Diagnosis not present

## 2021-07-31 DIAGNOSIS — Z794 Long term (current) use of insulin: Secondary | ICD-10-CM | POA: Diagnosis not present

## 2021-07-31 DIAGNOSIS — E1165 Type 2 diabetes mellitus with hyperglycemia: Secondary | ICD-10-CM | POA: Diagnosis not present

## 2021-07-31 DIAGNOSIS — E1159 Type 2 diabetes mellitus with other circulatory complications: Secondary | ICD-10-CM | POA: Diagnosis not present

## 2021-07-31 LAB — CRYOGLOBULIN

## 2021-08-01 LAB — GLUCOSE, CAPILLARY: Glucose-Capillary: 118 mg/dL — ABNORMAL HIGH (ref 70–99)

## 2021-08-03 ENCOUNTER — Other Ambulatory Visit: Payer: Self-pay | Admitting: Family Medicine

## 2021-08-03 DIAGNOSIS — F339 Major depressive disorder, recurrent, unspecified: Secondary | ICD-10-CM

## 2021-08-05 ENCOUNTER — Ambulatory Visit (INDEPENDENT_AMBULATORY_CARE_PROVIDER_SITE_OTHER): Payer: BC Managed Care – PPO | Admitting: Family Medicine

## 2021-08-05 VITALS — BP 114/78 | HR 98 | Temp 98.6°F | Wt 220.6 lb

## 2021-08-05 DIAGNOSIS — Z9049 Acquired absence of other specified parts of digestive tract: Secondary | ICD-10-CM | POA: Diagnosis not present

## 2021-08-05 DIAGNOSIS — R5383 Other fatigue: Secondary | ICD-10-CM

## 2021-08-05 DIAGNOSIS — D649 Anemia, unspecified: Secondary | ICD-10-CM | POA: Diagnosis not present

## 2021-08-05 DIAGNOSIS — Z9689 Presence of other specified functional implants: Secondary | ICD-10-CM

## 2021-08-05 DIAGNOSIS — Z794 Long term (current) use of insulin: Secondary | ICD-10-CM

## 2021-08-05 DIAGNOSIS — Z09 Encounter for follow-up examination after completed treatment for conditions other than malignant neoplasm: Secondary | ICD-10-CM

## 2021-08-05 DIAGNOSIS — E1169 Type 2 diabetes mellitus with other specified complication: Secondary | ICD-10-CM

## 2021-08-05 DIAGNOSIS — R509 Fever, unspecified: Secondary | ICD-10-CM | POA: Diagnosis not present

## 2021-08-05 DIAGNOSIS — R0683 Snoring: Secondary | ICD-10-CM

## 2021-08-05 LAB — CBC WITH DIFFERENTIAL/PLATELET
Basophils Absolute: 0 10*3/uL (ref 0.0–0.1)
Basophils Relative: 0.7 % (ref 0.0–3.0)
Eosinophils Absolute: 0.1 10*3/uL (ref 0.0–0.7)
Eosinophils Relative: 1.4 % (ref 0.0–5.0)
HCT: 35.6 % — ABNORMAL LOW (ref 39.0–52.0)
Hemoglobin: 11.4 g/dL — ABNORMAL LOW (ref 13.0–17.0)
Lymphocytes Relative: 25.5 % (ref 12.0–46.0)
Lymphs Abs: 1.3 10*3/uL (ref 0.7–4.0)
MCHC: 32.1 g/dL (ref 30.0–36.0)
MCV: 82.8 fl (ref 78.0–100.0)
Monocytes Absolute: 0.4 10*3/uL (ref 0.1–1.0)
Monocytes Relative: 8 % (ref 3.0–12.0)
Neutro Abs: 3.3 10*3/uL (ref 1.4–7.7)
Neutrophils Relative %: 64.4 % (ref 43.0–77.0)
Platelets: 338 10*3/uL (ref 150.0–400.0)
RBC: 4.3 Mil/uL (ref 4.22–5.81)
RDW: 14.8 % (ref 11.5–15.5)
WBC: 5.1 10*3/uL (ref 4.0–10.5)

## 2021-08-05 LAB — COMPREHENSIVE METABOLIC PANEL
ALT: 23 U/L (ref 0–53)
AST: 24 U/L (ref 0–37)
Albumin: 3.5 g/dL (ref 3.5–5.2)
Alkaline Phosphatase: 196 U/L — ABNORMAL HIGH (ref 39–117)
BUN: 14 mg/dL (ref 6–23)
CO2: 27 mEq/L (ref 19–32)
Calcium: 9.5 mg/dL (ref 8.4–10.5)
Chloride: 100 mEq/L (ref 96–112)
Creatinine, Ser: 0.65 mg/dL (ref 0.40–1.50)
GFR: 107.98 mL/min (ref >60.00)
Glucose, Bld: 90 mg/dL (ref 70–99)
Potassium: 4.4 mEq/L (ref 3.5–5.1)
Sodium: 137 mEq/L (ref 135–145)
Total Bilirubin: 0.6 mg/dL (ref 0.2–1.2)
Total Protein: 7.9 g/dL (ref 6.0–8.3)

## 2021-08-05 NOTE — Progress Notes (Signed)
Subjective:    Patient ID: Omar Travis, male    DOB: [birth year], 55 y.o.   MRN: [id number]  Chief Complaint  Patient presents with   [facility] admission and surgeries.  Patient accompanied by his wife.  HPI Patient is a 55 yo male with pmh sig for HTN, DM2, HLD, GERD, h/o depression who was seen today for HFU.  Pt admitted on 10/23-10/26/22 for  RUQ pain, fever 101.51F, and abnormal LFTs.  CT abd/pelvis with acute and chronic cholecystitis.  MRCP concerning for choledocholithiasis.  Pt underwent ERCP requiring sphincterotomy, balloon extraction and pancreatic stent placement with subsequent and lap cholecystectomy 07/17/21.  Upon tolerating po pt d/c that evening.  Patient returned to [facility] ED on 10/31-11/8/22 with anorexia, chills, generalized weakness.  Pt met SIRS criteria with fever and tachycardia.   CT chest negative for PE or pna.  CT abd and pelvis with changes s/p lap chole, but not acute process.  No source of infection identified.  Pt started on empiric IV zosyn. Transferred to [facility] for ID  eval.  Repeat MRCP 11/3 without postsurgical complication.  Blood Cx negative.  Pt d/c'd 11/8 without abx as afebrile x 48 hrs and WBC count normal.  Of note, pt O2 at night in hospital 2/2 nocturnal hypoxia.  Since d/c pt endorses bloating after eating and drinking.  + flatus and BMs.  Denies n/v, SOB, CP.  Some abd soreness.  Appetite increasing, eating some heavier foods without issue.  Endorses blurred vision.  Pt interested in sleep study as used O2 at night in hospital.  Fevers at night have not occurred in the last 48 hrs.  Has f/u with surgery and ID.  Past Medical History:  Diagnosis Date   Diabetes ([place])    GERD (gastroesophageal reflux disease)    HTN (hypertension)    Hyperlipidemia     Allergies  Allergen Reactions   Benadryl Allergy [Diphenhydramine Hcl] Rash   Flexeril [Cyclobenzaprine]     Causes "breakout"    ROS General: Denies chills, night sweats  +fever at night, weight loss HEENT: Denies headaches, ear pain, rhinorrhea, sore throat +blurred vision CV: Denies CP, palpitations, SOB, orthopnea Pulm: Denies SOB, cough, wheezing GI: Denies nausea, vomiting, diarrhea, constipation +bloating, abd soreness GU: Denies dysuria, hematuria, frequency Msk: Denies muscle cramps, joint pains Neuro: Denies weakness, numbness, tingling Skin: Denies rashes, bruising Psych: Denies depression, anxiety, hallucinations   Objective:    Blood pressure 114/78, pulse 98, temperature 98.6 F (37 C), temperature source Oral, weight 220 lb 9.6 oz (100.1 kg), SpO2 96 %.  Gen. Pleasant, well-nourished, in no distress, normal affect   HEENT: [place]/AT, face symmetric, conjunctiva clear, no scleral icterus, PERRLA, EOMI, nares patent without drainage Lungs: no accessory muscle use, CTAB, no wheezes or rales Cardiovascular: RRR, no m/r/g, no peripheral edema Abdomen: BS present, soft, soreness, ND, no hepatosplenomegaly. Musculoskeletal: No deformities, no cyanosis or clubbing, normal tone Neuro:  A&Ox3, CN II-XII intact, normal gait Skin:  Warm, dry, intact.  Abd with healing laparoscopic surgical incisions.   Wt Readings from Last 3 Encounters:  07/22/21 235 lb (106.6 kg)  07/16/21 235 lb (106.6 kg)  07/08/21 237 lb 3.2 oz (107.6 kg)    Lab Results  Component Value Date   WBC 7.1 07/28/2021   HGB 9.7 (L) 07/28/2021   HCT 30.5 (L) 07/28/2021   PLT 181 07/28/2021   GLUCOSE 208 (H) 07/28/2021   CHOL 181 07/08/2021   TRIG 268.0 (H)  07/08/2021   HDL 47.60 07/08/2021   LDLDIRECT 108.0 07/08/2021   ALT 38 07/26/2021   AST 43 (H) 07/26/2021   NA 132 (L) 07/28/2021   K 3.6 07/28/2021   CL 102 07/28/2021   CREATININE 0.72 07/28/2021   BUN 8 07/28/2021   CO2 26 07/28/2021   INR 1.0 07/27/2021   HGBA1C 9.2 (H) 07/08/2021    Assessment/Plan:  Hospital f/u  -TCM telephone call made and reviewed  FUO (fever of unknown origin)  -afebrile x 48  hours -encouraged to continue fever log.   -Tylenol prn -keep appt with ID -given precautions for worsened symptoms. - Plan: CBC with Differential/Platelet  Status post laparoscopic cholecystectomy -Encouraged to keep f/u appts with Gen surg, GI  - Plan: CMP  Anemia, unspecified type -baseline hgb ~15 -9.7 on 07/28/21 -recheck CBC with diff  Presence of pancreatic duct stent -placed 07/17/21 s/p MRCP -repeat CMP -encouraged to keep f/u with GI - Plan: CMP  Snoring -nocturnal hypoxia during hospitalization requiring supplemental O2 -discussed concern for OSA  - Plan: Ambulatory referral to Sleep Studies  Other fatigue -likely 2/2 deconditioning and wt loss from recent illness and surgery. -will obtain cbc to r/o worsening anemia -Plan : CBC with diff  Type 2 diabetes mellitus with other specified complication, with long-term use of insulin (HCC) -uncontrolled -hgb A1C 9.2% on 07/08/21 -discussed the importance of lifestyle modifications.  Reviewed carb intake.  Encouraged to read labels on products -discussed revisiting nutritionist.  Pt declines at this time.  May look into nutrition education with insurance company. -discussed adjusting insulin as appetite increases -continue f/u with Endo  F/u prn in the next few wks  Grier Mitts, MD

## 2021-08-07 ENCOUNTER — Other Ambulatory Visit: Payer: Self-pay

## 2021-08-07 ENCOUNTER — Encounter: Payer: Self-pay | Admitting: Internal Medicine

## 2021-08-07 ENCOUNTER — Ambulatory Visit (INDEPENDENT_AMBULATORY_CARE_PROVIDER_SITE_OTHER): Payer: BC Managed Care – PPO | Admitting: Internal Medicine

## 2021-08-07 ENCOUNTER — Inpatient Hospital Stay: Payer: BC Managed Care – PPO | Admitting: Family Medicine

## 2021-08-07 VITALS — BP 114/76 | HR 99 | Temp 97.1°F | Wt 221.0 lb

## 2021-08-07 DIAGNOSIS — R509 Fever, unspecified: Secondary | ICD-10-CM

## 2021-08-07 NOTE — Patient Instructions (Signed)
Labs today  Follow up with surgery tomorrow  Follow up with Dr Tommy Medal on 12/5  Continue keeping a fever log and update [place] if any recurrent fevers

## 2021-08-07 NOTE — Progress Notes (Signed)
[facility] for Infectious Disease  Reason for Consult:Fevers  Referring Provider: Dr Volanda Napoleon   HPI:    Omar Travis is a 55 y.o. male with PMHx as below who presents to the clinic for continued fevers.   Patient was recently admitted at [facility] from 10/31-11/8 for high fevers of unknown source.  He presented within about 1 week of recent lap chole and pancreatic stenting.  He had extensive work up that was unrevealing and fevers eventually abated resulting in discharge home.  Work up included CT scan abd/pelvis, CT chest, and MRCP.  All were unremarkable for explanation of fevers due to surgery.  Blood cultures were negative and he received 5 days of Zosyn for empiric coverage.  Other labs included non-specific elevated inflamatory markers and EBV/CMV serology c/w prior infection.  He was scheduled for f/u with Dr Tommy Medal but sent a Mychart message noting ongoing fevers since discharge.  The highest temp recorded was 100.9 that resolved with Tylenol. His only other symptom with fevers really was weakness.  He saw his PCP 2 days ago and labs showed normal CMP with exception of mildly elevated Alk phos 196.  CBC showed normal WBC, improved platelets, and improving hemoglobin.   Today, reports Tmax Thursday of 101 that resolved with Tylenol.  Has now been afebrile since then for about 5-6 days.  Otherwise, feeling well.  Some abdominal bloating and constipation but no pain. He is up and moving around. He sees his surgeon tomorrow for follow up.   Patient's Medications  New Prescriptions   No medications on file  Previous Medications   CANAGLIFLOZIN (INVOKANA) 300 MG TABS TABLET    Take 300 mg by mouth daily before breakfast.   DICYCLOMINE (BENTYL) 10 MG CAPSULE    Take 10 mg by mouth 3 (three) times daily as needed for spasms.   ESCITALOPRAM (LEXAPRO) 10 MG TABLET    Take 1 tablet (10 mg total) by mouth daily.   FENOFIBRATE (TRICOR) 145 MG TABLET    Take 145 mg by mouth daily.    INSULIN GLARGINE, 1 UNIT DIAL, (TOUJEO SOLOSTAR) 300 UNIT/ML SOLOSTAR PEN    Inject 20 Units into the skin daily.   LISINOPRIL (ZESTRIL) 5 MG TABLET    TAKE ONE (1) TABLET EACH DAY   OMEPRAZOLE (PRILOSEC) 20 MG CAPSULE    Take 1 capsule (20 mg total) by mouth daily.   ONDANSETRON (ZOFRAN-ODT) 8 MG DISINTEGRATING TABLET    Take 8 mg by mouth every 8 (eight) hours as needed for nausea.   OXYCODONE (OXY IR/ROXICODONE) 5 MG IMMEDIATE RELEASE TABLET    Take 1 tablet (5 mg total) by mouth every 6 (six) hours as needed for moderate pain or severe pain (not relieved by tylenol or ibuprofen.).   ROSUVASTATIN (CRESTOR) 5 MG TABLET    Take 5 mg by mouth daily.   SEMAGLUTIDE (OZEMPIC, 1 MG/DOSE, [place])    Inject 1 mg into the skin once a week.  Modified Medications   No medications on file  Discontinued Medications   No medications on file      Past Medical History:  Diagnosis Date   Diabetes ([place])    GERD (gastroesophageal reflux disease)    HTN (hypertension)    Hyperlipidemia     Social History   Tobacco Use   Smoking status: Never   Smokeless tobacco: Never  Substance Use Topics   Alcohol use: Yes   Drug use: Never    Family  History  Problem Relation Age of Onset   Diabetes Mother    Liver cancer Father    Diabetes Father     Allergies  Allergen Reactions   Benadryl Allergy [Diphenhydramine Hcl] Rash   Flexeril [Cyclobenzaprine]     Causes "breakout"    Review of Systems  Constitutional:  Positive for fever and malaise/fatigue.  Respiratory: Negative.    Cardiovascular: Negative.   Gastrointestinal:  Positive for constipation. Negative for abdominal pain, nausea and vomiting.  Genitourinary: Negative.   Musculoskeletal: Negative.   All other systems reviewed and are negative.    OBJECTIVE:    Vitals:   08/07/21 0908  BP: 114/76  Pulse: 99  Temp: (!) 97.1 F (36.2 C)  TempSrc: Temporal  Weight: 221 lb (100.2 kg)     Body mass index is 29.97 kg/m.  Physical  Exam Constitutional:      General: He is not in acute distress.    Appearance: Normal appearance.  HENT:     Head: Normocephalic and atraumatic.  Eyes:     Extraocular Movements: Extraocular movements intact.     Conjunctiva/sclera: Conjunctivae normal.  Cardiovascular:     Rate and Rhythm: Normal rate and regular rhythm.     Heart sounds: No murmur heard. Pulmonary:     Effort: Pulmonary effort is normal. No respiratory distress.     Breath sounds: Normal breath sounds.  Abdominal:     General: There is no distension.     Palpations: Abdomen is soft.     Tenderness: There is no abdominal tenderness. There is no guarding or rebound.     Comments: Laparoscopic incisions are healing.  Musculoskeletal:        General: Normal range of motion.     Cervical back: Normal range of motion and neck supple.  Skin:    General: Skin is warm and dry.     Coloration: Skin is not jaundiced.  Neurological:     General: No focal deficit present.     Mental Status: He is alert and oriented to person, place, and time.  Psychiatric:        Mood and Affect: Mood normal.        Behavior: Behavior normal.     Labs and Microbiology:  CBC Latest Ref Rng & Units 08/05/2021 07/28/2021 07/27/2021  WBC 4.0 - 10.5 K/uL 5.1 7.1 6.5  Hemoglobin 13.0 - 17.0 g/dL 11.4(L) 9.7(L) 10.1(L)  Hematocrit 39.0 - 52.0 % 35.6(L) 30.5(L) 31.4(L)  Platelets 150.0 - 400.0 K/uL 338.0 181 145(L)   CMP Latest Ref Rng & Units 08/05/2021 07/28/2021 07/27/2021  Glucose 70 - 99 mg/dL 90 208(H) 149(H)  BUN 6 - 23 mg/dL '14 8 9  ' Creatinine 0.40 - 1.50 mg/dL 0.65 0.72 0.81  Sodium 135 - 145 mEq/L 137 132(L) 135  Potassium 3.5 - 5.1 mEq/L 4.4 3.6 3.6  Chloride 96 - 112 mEq/L 100 102 102  CO2 19 - 32 mEq/L '27 26 25  ' Calcium 8.4 - 10.5 mg/dL 9.5 8.2(L) 8.5(L)  Total Protein 6.0 - 8.3 g/dL 7.9 - -  Total Bilirubin 0.2 - 1.2 mg/dL 0.6 - -  Alkaline Phos 39 - 117 U/L 196(H) - -  AST 0 - 37 U/L 24 - -  ALT 0 - 53 U/L 23 - -        ASSESSMENT & PLAN:    1. FUO (fever of unknown origin)  Fortunately, has now been afebrile for about 5 days and is afebrile here.  He  is generally feeling well and labs 2 days ago at PCP office were overall reassuring.  Will repeat non-specific inflammatory markers today to assess trend.  As noted above, his work up for FUO has been unrevealing.  Discussed with patient that sometimes fevers can take a while to fully abate but in the absence of new or worsening symptoms will not recommend further work up at this time.  He expressed agreement and certainly if he develops any new symptoms than this would alter management.  He sees surgery tomorrow and advised him to follow up with Dr Tommy Medal as well on 12/5.     Raynelle Highland for Infectious Disease [facility] 08/07/2021, 9:33 AM   I spent 30 minutes dedicated to the care of this patient on the date of this encounter to include pre-visit review of records, face-to-face time with the patient discussing FUO, and post-visit ordering of testing.

## 2021-08-08 ENCOUNTER — Telehealth: Payer: Self-pay

## 2021-08-08 ENCOUNTER — Encounter: Payer: Self-pay | Admitting: Family Medicine

## 2021-08-08 LAB — SEDIMENTATION RATE: Sed Rate: 113 mm/h — ABNORMAL HIGH (ref 0–20)

## 2021-08-08 LAB — C-REACTIVE PROTEIN: CRP: 46.1 mg/L — ABNORMAL HIGH (ref <8.0)

## 2021-08-08 NOTE — Telephone Encounter (Signed)
Mychart message sent to patient reviewing recent lab work. Requested call if any questions/concerns.  Kaydyn Chism Lorita Officer, RN

## 2021-08-08 NOTE — Telephone Encounter (Signed)
-----  Message from Mignon Pine, DO sent at 08/08/2021  9:40 AM EST ----- Please let pt know that his inflammatory markers are still elevated. They are somewhat difficult to interpret given his overall feeling better and may just be slow to resolve from recent illness. ESR was only up slightly from last measurement and CRP was run on a different platform so also difficult to interpret given that (i.e. upper limit normal was < 1 on hospital run sample and yesterday the upper limit of normal was < 8). I would have him continue to monitor temps and how he is feeling. Follow up with Dr Tommy Medal as was scheduled previously and let us know if any changes.   Thanks.

## 2021-08-19 ENCOUNTER — Other Ambulatory Visit: Payer: Self-pay

## 2021-08-19 ENCOUNTER — Encounter (HOSPITAL_COMMUNITY): Payer: Self-pay | Admitting: Emergency Medicine

## 2021-08-19 ENCOUNTER — Emergency Department (HOSPITAL_COMMUNITY)
Admission: EM | Admit: 2021-08-19 | Discharge: 2021-08-20 | Disposition: A | Discharge status: Home / Self Care | Payer: BC Managed Care – PPO | Attending: Emergency Medicine | Admitting: Emergency Medicine

## 2021-08-19 ENCOUNTER — Encounter: Payer: Self-pay | Admitting: Family Medicine

## 2021-08-19 DIAGNOSIS — E119 Type 2 diabetes mellitus without complications: Secondary | ICD-10-CM | POA: Diagnosis not present

## 2021-08-19 DIAGNOSIS — K7460 Unspecified cirrhosis of liver: Secondary | ICD-10-CM | POA: Diagnosis not present

## 2021-08-19 DIAGNOSIS — I10 Essential (primary) hypertension: Secondary | ICD-10-CM | POA: Diagnosis not present

## 2021-08-19 DIAGNOSIS — R000 Tachycardia, unspecified: Secondary | ICD-10-CM | POA: Diagnosis not present

## 2021-08-19 DIAGNOSIS — Z794 Long term (current) use of insulin: Secondary | ICD-10-CM | POA: Insufficient documentation

## 2021-08-19 DIAGNOSIS — R6510 Systemic inflammatory response syndrome (SIRS) of non-infectious origin without acute organ dysfunction: Secondary | ICD-10-CM | POA: Insufficient documentation

## 2021-08-19 DIAGNOSIS — R109 Unspecified abdominal pain: Secondary | ICD-10-CM | POA: Insufficient documentation

## 2021-08-19 DIAGNOSIS — Z9889 Other specified postprocedural states: Secondary | ICD-10-CM | POA: Diagnosis not present

## 2021-08-19 DIAGNOSIS — Z79899 Other long term (current) drug therapy: Secondary | ICD-10-CM | POA: Diagnosis not present

## 2021-08-19 DIAGNOSIS — R112 Nausea with vomiting, unspecified: Secondary | ICD-10-CM | POA: Diagnosis not present

## 2021-08-19 DIAGNOSIS — N281 Cyst of kidney, acquired: Secondary | ICD-10-CM | POA: Diagnosis not present

## 2021-08-19 DIAGNOSIS — R161 Splenomegaly, not elsewhere classified: Secondary | ICD-10-CM | POA: Diagnosis not present

## 2021-08-19 LAB — CBC WITH DIFFERENTIAL/PLATELET
Abs Immature Granulocytes: 0.12 10*3/uL — ABNORMAL HIGH (ref 0.00–0.07)
Basophils Absolute: 0 10*3/uL (ref 0.0–0.1)
Basophils Relative: 0 %
Eosinophils Absolute: 0.1 10*3/uL (ref 0.0–0.5)
Eosinophils Relative: 0 %
HCT: 44.7 % (ref 39.0–52.0)
Hemoglobin: 14.2 g/dL (ref 13.0–17.0)
Immature Granulocytes: 1 %
Lymphocytes Relative: 11 %
Lymphs Abs: 1.9 10*3/uL (ref 0.7–4.0)
MCH: 26.5 pg (ref 26.0–34.0)
MCHC: 31.8 g/dL (ref 30.0–36.0)
MCV: 83.4 fL (ref 80.0–100.0)
Monocytes Absolute: 1.2 10*3/uL — ABNORMAL HIGH (ref 0.1–1.0)
Monocytes Relative: 7 %
Neutro Abs: 13.1 10*3/uL — ABNORMAL HIGH (ref 1.7–7.7)
Neutrophils Relative %: 81 %
Platelets: 231 10*3/uL (ref 150–400)
RBC: 5.36 MIL/uL (ref 4.22–5.81)
RDW: 14.4 % (ref 11.5–15.5)
WBC: 16.3 10*3/uL — ABNORMAL HIGH (ref 4.0–10.5)
nRBC: 0 % (ref 0.0–0.2)

## 2021-08-19 LAB — COMPREHENSIVE METABOLIC PANEL
ALT: 77 U/L — ABNORMAL HIGH (ref 0–44)
AST: 108 U/L — ABNORMAL HIGH (ref 15–41)
Albumin: 3.7 g/dL (ref 3.5–5.0)
Alkaline Phosphatase: 124 U/L (ref 38–126)
Anion gap: 11 (ref 5–15)
BUN: 11 mg/dL (ref 6–20)
CO2: 23 mmol/L (ref 22–32)
Calcium: 9.6 mg/dL (ref 8.9–10.3)
Chloride: 97 mmol/L — ABNORMAL LOW (ref 98–111)
Creatinine, Ser: 0.98 mg/dL (ref 0.61–1.24)
GFR, Estimated: >60 mL/min (ref >60)
Glucose, Bld: 151 mg/dL — ABNORMAL HIGH (ref 70–99)
Potassium: 4.2 mmol/L (ref 3.5–5.1)
Sodium: 131 mmol/L — ABNORMAL LOW (ref 135–145)
Total Bilirubin: 2 mg/dL — ABNORMAL HIGH (ref 0.3–1.2)
Total Protein: 7.8 g/dL (ref 6.5–8.1)

## 2021-08-19 LAB — URINALYSIS, ROUTINE W REFLEX MICROSCOPIC
Bacteria, UA: NONE SEEN
Bilirubin Urine: NEGATIVE
Glucose, UA: >=500 mg/dL — AB
Hgb urine dipstick: NEGATIVE
Ketones, ur: 20 mg/dL — AB
Leukocytes,Ua: NEGATIVE
Nitrite: NEGATIVE
Protein, ur: NEGATIVE mg/dL
Specific Gravity, Urine: 1.031 — ABNORMAL HIGH (ref 1.005–1.030)
pH: 5 (ref 5.0–8.0)

## 2021-08-19 LAB — LIPASE, BLOOD: Lipase: 31 U/L (ref 11–51)

## 2021-08-19 NOTE — ED Notes (Signed)
CT states they will come in 10 to get patient for scan

## 2021-08-19 NOTE — ED Triage Notes (Signed)
Pt c/o abdominal pain x 2 weeks, one episode of n/v today. Reports he recently had surgery to remove gall stones and gallbladder.

## 2021-08-19 NOTE — ED Provider Notes (Signed)
Emergency Medicine Provider Triage Evaluation Note  Omar Travis , a 55 y.o. male  was evaluated in triage.  Pt complains of abdominal pain.  Is been going on for a few weeks, worsened today.  Had gallbladder removed 1 month ago, no complications since then but he has had pain in this surrounding surgical sites.  His current pain is epigastric and right lower quadrant.  No other abdominal procedures other than the cholecystectomy.  Denies any fevers at home, and 1 episode of nausea and vomiting today..  Review of Systems  Positive: Epigastric pain, right lower quadrant pain, nausea, vomiting Negative: Fever  Physical Exam  There were no vitals taken for this visit. Gen:   Awake, no distress   Resp:  Normal effort  MSK:   Moves extremities without difficulty  Other:  RLQ and epigastric tenderness.  Voluntary guarding.  Medical Decision Making  Medically screening exam initiated at 6:55 PM.  Appropriate orders placed.  [name] was informed that the remainder of the evaluation will be completed by another provider, this initial triage assessment does not replace that evaluation, and the importance of remaining in the ED until their evaluation is complete.  Abdominal labs, patient is 1 month postop with worsening pain nausea and vomiting so we will get CT abdomen to assess for possible infectious process.  Also having right lower quadrant pain so appendicitis is a possibility.   Sherrill Raring, PA-C 08/19/21 1856    Lacretia Leigh, MD 08/20/21 [phone]

## 2021-08-20 ENCOUNTER — Emergency Department (HOSPITAL_COMMUNITY): Payer: BC Managed Care – PPO

## 2021-08-20 DIAGNOSIS — K7460 Unspecified cirrhosis of liver: Secondary | ICD-10-CM | POA: Diagnosis not present

## 2021-08-20 DIAGNOSIS — N281 Cyst of kidney, acquired: Secondary | ICD-10-CM | POA: Diagnosis not present

## 2021-08-20 DIAGNOSIS — Z9889 Other specified postprocedural states: Secondary | ICD-10-CM | POA: Diagnosis not present

## 2021-08-20 DIAGNOSIS — R161 Splenomegaly, not elsewhere classified: Secondary | ICD-10-CM | POA: Diagnosis not present

## 2021-08-20 LAB — LACTIC ACID, PLASMA: Lactic Acid, Venous: 1.2 mmol/L (ref 0.5–1.9)

## 2021-08-20 MED ORDER — IOHEXOL 350 MG/ML SOLN
80.0000 mL | Freq: Once | INTRAVENOUS | Status: AC | PRN
  Administered 2021-08-20: 80 mL via INTRAVENOUS

## 2021-08-20 MED ORDER — LACTATED RINGERS IV BOLUS
2000.0000 mL | Freq: Once | INTRAVENOUS | Status: AC
  Administered 2021-08-20: 2000 mL via INTRAVENOUS

## 2021-08-20 MED ORDER — ACETAMINOPHEN 500 MG PO TABS: 1000.0000 mg | ORAL_TABLET | Freq: Once | ORAL | Status: DC

## 2021-08-20 MED ORDER — IBUPROFEN 200 MG PO TABS
ORAL_TABLET | ORAL | Status: AC
  Filled 2021-08-20: qty 1

## 2021-08-20 MED ORDER — IBUPROFEN 400 MG PO TABS
600.0000 mg | ORAL_TABLET | Freq: Once | ORAL | Status: AC
  Administered 2021-08-20: 600 mg via ORAL

## 2021-08-20 MED ORDER — IBUPROFEN 400 MG PO TABS
ORAL_TABLET | ORAL | Status: AC
  Filled 2021-08-20: qty 1

## 2021-08-20 NOTE — ED Provider Notes (Signed)
[facility] EMERGENCY DEPARTMENT Provider Note   CSN: [id number] Arrival date & time: 08/19/21  1739     History Chief Complaint  Patient presents with   Abdominal Pain    Omar Travis is a 55 y.o. male.  55 year old male with a history of diabetes, hypertension, hyperlipidemia, esophageal reflux presents to the emergency department for evaluation of ongoing abdominal pain.  He was advised to come to the ED by his primary care doctor given the chronicity of his symptoms.  Patient reports having upper abdominal pain since his diagnosis of choledocholithiasis and subsequent ERCP and cholecystectomy.  He describes his pain as an aching sensation which is worse in the morning and will continue to wax and wane throughout the day.  He has tried Imodium, Gas-X, sporadic Tylenol without relief.  Symptoms associated with intermittent nausea as well as some vomiting.  Last episode of vomiting was earlier today.  He has been followed by multiple specialists as an outpatient due to fever of unknown origin postoperatively.  Was noted to be febrile in triage, but was unaware of fever prior to arrival.  Denies any sick contacts.  Had a normal bowel movement earlier today.  Is scheduled to see gastroenterology on Wednesday for further evaluation of his ongoing abdominal pain.  The history is provided by the patient. No language interpreter was used.  Abdominal Pain     Past Medical History:  Diagnosis Date   Diabetes ([place])    GERD (gastroesophageal reflux disease)    HTN (hypertension)    Hyperlipidemia     Patient Active Problem List   Diagnosis Date Noted   Generalized weakness    FUO (fever of unknown origin) 07/24/2021   Pancytopenia ([place]) 07/24/2021   SIRS (systemic inflammatory response syndrome) ([place])    Febrile illness, acute 07/22/2021   Cholangitis 07/14/2021   Choledocholithiasis with acute cholecystitis    Depression, recurrent ([place]) 11/24/2019    Gastroesophageal reflux disease 06/22/2019   Hyperlipidemia 06/22/2019   Diabetes mellitus ([place])    Essential hypertension     Past Surgical History:  Procedure Laterality Date   CHOLECYSTECTOMY N/A 07/17/2021   Procedure: LAPAROSCOPIC CHOLECYSTECTOMY;  Surgeon: Clovis Riley, MD;  Location: [place];  Service: General;  Laterality: N/A;   ENDOSCOPIC RETROGRADE CHOLANGIOPANCREATOGRAPHY (ERCP) WITH PROPOFOL N/A 07/16/2021   Procedure: ENDOSCOPIC RETROGRADE CHOLANGIOPANCREATOGRAPHY (ERCP) WITH PROPOFOL;  Surgeon: Clarene Essex, MD;  Location: [place];  Service: Endoscopy;  Laterality: N/A;   PANCREATIC STENT PLACEMENT  07/16/2021   Procedure: PANCREATIC STENT PLACEMENT;  Surgeon: Clarene Essex, MD;  Location: [place];  Service: Endoscopy;;   REMOVAL OF STONES  07/16/2021   Procedure: REMOVAL OF STONES;  Surgeon: Clarene Essex, MD;  Location: [place];  Service: Endoscopy;;   SPHINCTEROTOMY  07/16/2021   Procedure: Joan Mayans;  Surgeon: Clarene Essex, MD;  Location[facility] ENDOSCOPY;  Service: Endoscopy;;       Family History  Problem Relation Age of Onset   Diabetes Mother    Liver cancer Father    Diabetes Father     Social History   Tobacco Use   Smoking status: Never   Smokeless tobacco: Never  Substance Use Topics   Alcohol use: Yes   Drug use: Never    Home Medications Prior to Admission medications   Medication Sig Start Date End Date Taking? Authorizing Provider  canagliflozin (INVOKANA) 300 MG TABS tablet Take 300 mg by mouth daily before breakfast.    [provider]  dicyclomine (  BENTYL) 10 MG capsule Take 10 mg by mouth 3 (three) times daily as needed for spasms. 05/08/21   [provider]  escitalopram (LEXAPRO) 10 MG tablet Take 1 tablet (10 mg total) by mouth daily. 08/05/21   Billie Ruddy, MD  fenofibrate (TRICOR) 145 MG tablet Take 145 mg by mouth daily.    [provider]  insulin glargine, 1 Unit Dial, (TOUJEO  SOLOSTAR) 300 UNIT/ML Solostar Pen Inject 20 Units into the skin daily. Patient taking differently: Inject 30 Units into the skin daily. 07/30/21   Dahal, Marlowe Aschoff, MD  lisinopril (ZESTRIL) 5 MG tablet TAKE ONE (1) TABLET EACH DAY Patient taking differently: Take 5 mg by mouth daily. 03/26/21   Billie Ruddy, MD  omeprazole (PRILOSEC) 20 MG capsule Take 1 capsule (20 mg total) by mouth daily. 06/25/20   Billie Ruddy, MD  ondansetron (ZOFRAN-ODT) 8 MG disintegrating tablet Take 8 mg by mouth every 8 (eight) hours as needed for nausea. 05/08/21   [provider]  oxyCODONE (OXY IR/ROXICODONE) 5 MG immediate release tablet Take 1 tablet (5 mg total) by mouth every 6 (six) hours as needed for moderate pain or severe pain (not relieved by tylenol or ibuprofen.). Patient not taking: Reported on 08/07/2021 07/17/21   Jill Alexanders, PA-C  rosuvastatin (CRESTOR) 5 MG tablet Take 5 mg by mouth daily.    [provider]  Semaglutide (OZEMPIC, 1 MG/DOSE, [place]) Inject 1 mg into the skin once a week.    [provider]    Allergies    Benadryl allergy [diphenhydramine hcl] and Flexeril [cyclobenzaprine]  Review of Systems   Review of Systems  Gastrointestinal:  Positive for abdominal pain.  Ten systems reviewed and are negative for acute change, except as noted in the HPI.    Physical Exam Updated Vital Signs BP 103/74   Pulse (!) 101   Temp 98.7 F (37.1 C) (Oral)   Resp 15   SpO2 96%   Physical Exam Vitals and nursing note reviewed.  Constitutional:      General: He is not in acute distress.    Appearance: He is well-developed. He is not diaphoretic.     Comments: Nontoxic-appearing and in no distress  HENT:     Head: Normocephalic and atraumatic.  Eyes:     General: No scleral icterus.    Conjunctiva/sclera: Conjunctivae normal.  Cardiovascular:     Rate and Rhythm: Regular rhythm. Tachycardia present.     Pulses: Normal pulses.  Pulmonary:     Effort:  Pulmonary effort is normal. No respiratory distress.     Comments: Respirations even and unlabored Abdominal:     Comments: Abdomen soft, obese.  There is epigastric tenderness with voluntary guarding.  No peritoneal signs.  Surgical sites well-healed without dehiscence, erythema, induration, drainage.  Musculoskeletal:        General: Normal range of motion.     Cervical back: Normal range of motion.  Skin:    General: Skin is warm and dry.     Coloration: Skin is not pale.     Findings: No erythema or rash.  Neurological:     Mental Status: He is alert and oriented to person, place, and time.     Coordination: Coordination normal.  Psychiatric:        Behavior: Behavior normal.    ED Results / Procedures / Treatments   Labs (all labs ordered are listed, but only abnormal results are displayed) Labs Reviewed  CBC  WITH DIFFERENTIAL/PLATELET - Abnormal; Notable for the following components:      Result Value   WBC 16.3 (*)    Neutro Abs 13.1 (*)    Monocytes Absolute 1.2 (*)    Abs Immature Granulocytes 0.12 (*)    All other components within normal limits  COMPREHENSIVE METABOLIC PANEL - Abnormal; Notable for the following components:   Sodium 131 (*)    Chloride 97 (*)    Glucose, Bld 151 (*)    AST 108 (*)    ALT 77 (*)    Total Bilirubin 2.0 (*)    All other components within normal limits  URINALYSIS, ROUTINE W REFLEX MICROSCOPIC - Abnormal; Notable for the following components:   Specific Gravity, Urine 1.031 (*)    Glucose, UA >=500 (*)    Ketones, ur 20 (*)    All other components within normal limits  CULTURE, BLOOD (ROUTINE X 2)  CULTURE, BLOOD (ROUTINE X 2)  RESP PANEL BY RT-PCR (FLU A&B, COVID) ARPGX2  LIPASE, BLOOD  LACTIC ACID, PLASMA    EKG None  Radiology CT Abdomen Pelvis W Contrast  Result Date: 08/20/2021 CLINICAL DATA:  Initial evaluation for acute right lower quadrant abdominal pain, appendicitis suspected. EXAM: CT ABDOMEN AND PELVIS WITH  CONTRAST TECHNIQUE: Multidetector CT imaging of the abdomen and pelvis was performed using the standard protocol following bolus administration of intravenous contrast. CONTRAST:  46mL OMNIPAQUE IOHEXOL 350 MG/ML SOLN COMPARISON:  Prior CT from 07/22/2021. FINDINGS: Lower chest: 3 mm left lower lobe pulmonary nodule, similar to prior (series 5, image 7). Visualized lungs are otherwise clear. Hepatobiliary: Morphologic changes suggestive of cirrhosis again noted. Marked biliary dilatation involving hepatic segment 6 in the caudate lobe again noted, mildly worsened in appearance as compared to previous. Progressive intrahepatic biliary dilatation noted within the left hepatic lobe as well (series 4, image 18). Common bile duct remains normal in caliber. Small focus of pneumobilia noted within the common duct itself, which could be iatrogenic a related to prior sphincterotomy (series 4, image 31). No visible choledocholithiasis. Prior cholecystectomy. Residual postoperative stranding about the surgical site without collection or biloma. Pancreas: Pancreas within normal limits. No peripancreatic inflammation or abnormal ductal dilatation. Spleen: Mild splenomegaly, similar to previous. No other focal or acute abnormality. Adrenals/Urinary Tract: Adrenal glands within normal limits. The kidneys equal size with symmetric enhancement. Few scattered parapelvic cyst noted about the left kidney, stable. No nephrolithiasis or hydronephrosis. No focal enhancing renal mass. No hydroureter or ureterolithiasis. Bladder largely decompressed without acute finding. Stomach/Bowel: Stomach largely decompressed without acute abnormality. No evidence for bowel obstruction. Appendix within normal limits without evidence for acute appendicitis. No acute inflammatory changes seen about the bowels. Vascular/Lymphatic: Normal intravascular enhancement seen throughout the intra-abdominal aorta. No aneurysm. Mesenteric vessels patent  proximally. Portal and splenic veins are patent. Probable collateral vessels noted near the splenic hilum, stable. Prominent 1 cm porta hepatis node, which could be reactive (series 4, image 30). Few additional scattered subcentimeter nodes within the mesentery with hazy mesenteric stranding, stable from prior. No other new adenopathy. Reproductive: Prostate and seminal vesicles within normal limits. Other: No free fluid or ascites.  No free air. Musculoskeletal: No acute osseous finding. No discrete or worrisome osseous lesions. IMPRESSION: 1. Postoperative changes from recent cholecystectomy. Normal expected postoperative stranding within the operative bed without collection. 2. Chronic intrahepatic biliary dilatation involving the right and caudate lobes, with more mild involvement of the left hepatic lobe. Appearance is mildly worsened as compared to 07/22/2021.  Common bile duct of normal caliber. No visible choledocholithiasis. Small focus of gas within the CBD could be iatrogenic. 3. Cirrhosis with splenomegaly and probable portal hypertension, stable. 4. Hazy mesenteric stranding with shotty subcentimeter lymph nodes, of uncertain significance, but can be seen with adenitis/panniculitis, although can be seen in asymptomatic patients as well. Finding is similar to previous. 5. Normal appendix. 6. 3 mm left lower lobe nodule, similar to previous. No follow-up needed if patient is low-risk. Non-contrast chest CT can be considered in 12 months if patient is high-risk. This recommendation follows the consensus statement: Guidelines for Management of Incidental Pulmonary Nodules Detected on CT Images: From the Fleischner Society 2017; Radiology 2017; 284:228-243. Electronically Signed   By: Jeannine Boga M.D.   On: 08/20/2021 01:37    Procedures Procedures   Medications Ordered in ED Medications  iohexol (OMNIPAQUE) 350 MG/ML injection 80 mL (80 mLs Intravenous Contrast Given 08/20/21 0042)  lactated  ringers bolus 2,000 mL (2,000 mLs Intravenous New Bag/Given 08/20/21 0359)  ibuprofen (ADVIL) tablet 600 mg (600 mg Oral Given 08/20/21 0355)    ED Course  I have reviewed the triage vital signs and the nursing notes.  Pertinent labs & imaging results that were available during my care of the patient were reviewed by me and considered in my medical decision making (see chart for details).  Clinical Course as of 08/20/21 0160  Tue Aug 20, 9054  [id number] 55 year old male presenting for ongoing abdominal pain after diagnosis of choledocholithiasis in October.  He underwent ERCP followed by cholecystectomy and has had ongoing pain since these procedures.  Was readmitted within a week of discharge for fever.  Has continued to follow with infectious disease who have been trending his inflammatory markers.  Has had negative testing for Lyme, RMSF, ehrlichiosis, hepatitis. CMV and EBV with mild IgG elevation; negative IgM.   Today he was advised to come to the ED by his primary care doctor, though he denies any specific change to his abdominal discomfort.  He has some epigastric tenderness with voluntary guarding, but relatively stable CT scan compared to 1 month prior.  Patient does meet SIRS criteria today with temperature of 100.6 F on arrival, leukocytosis, pulse of 133 bpm.  No clear source of infection at the present time.  Will hydrate with IV fluids, give ibuprofen for fever given minimal LFT elevation.  Lactate and blood cultures added.  Will continue to monitor and reassess. [KH]  [id number] ID paged to discuss patient case [KH]  [id number] Spoke with Dr. Linus Salmons regarding patient's history, new SIRS criteria with fever, leukocytosis, tachycardia today. If vital signs improve, Dr. Linus Salmons feels outpatient office f/u is reasonable and they can follow with the patient this week. [KH]    Clinical Course User Index [KH] Beverely Pace   MDM Rules/Calculators/A&P                           55 year old male  presents to the emergency department for evaluation of abdominal pain.  His abdominal pain is chronic and has been persistent since his surgery at the end of October.  He denies any change to his abdominal pain prompting his visit to the emergency department.  While he states that it has been ongoing and unrelieved with the use of over-the-counter remedies, he was largely encouraged to seek evaluation in the emergency department by his primary care doctor given symptom persistence without clear cause.    Has been  followed as an outpatient by infectious disease for fever of unknown origin which developed postoperatively.  Was noted to be febrile upon arrival to the ED today with temperature of 100.6 F.  This improved spontaneously prior to antipyretics.  Tachycardia has improved with IV fluids.  Patient does meet criteria for SIRS today, but no source of infection to suspect sepsis.  Patient has a normal lactic acid level.  His blood cultures are pending.  Patient underwent repeat CT abdominal imaging which is largely stable compared to October.    I have discussed the patient's case with infectious disease who advised close follow-up in the office this week.  Dr. Linus Salmons does not see indication for empiric antibiotics as utilized during his prior admission.  The patient has been instructed to contact the office this morning to schedule ID follow-up.  He also has follow-up scheduled with gastroenterology tomorrow.  Return precautions discussed and provided. Patient discharged in stable condition with no unaddressed concerns.   Final Clinical Impression(s) / ED Diagnoses Final diagnoses:  SIRS (systemic inflammatory response syndrome) [facility])    Rx / DC Orders ED Discharge Orders     None        Antonietta Breach, PA-C 08/20/21 1282    Fatima Blank, MD 08/20/21 [phone]

## 2021-08-20 NOTE — Discharge Instructions (Signed)
Your abdominal CT did not show any notable change since your last hospitalization 1 month ago.  You have blood cultures pending and you will be notified if these return positive.  In the interim, we recommend follow-up with infectious disease in clinic.  Call this morning to schedule a follow-up appointment.  Continue follow-up as scheduled with gastroenterology.  Return to the ED for new or concerning symptoms.

## 2021-08-21 ENCOUNTER — Ambulatory Visit (INDEPENDENT_AMBULATORY_CARE_PROVIDER_SITE_OTHER): Payer: BC Managed Care – PPO | Admitting: Internal Medicine

## 2021-08-21 ENCOUNTER — Other Ambulatory Visit (HOSPITAL_COMMUNITY): Payer: Self-pay

## 2021-08-21 ENCOUNTER — Other Ambulatory Visit: Payer: Self-pay | Admitting: Gastroenterology

## 2021-08-21 ENCOUNTER — Encounter: Payer: Self-pay | Admitting: Internal Medicine

## 2021-08-21 ENCOUNTER — Other Ambulatory Visit: Payer: Self-pay

## 2021-08-21 VITALS — BP 112/76 | HR 114 | Temp 98.2°F | Wt 221.0 lb

## 2021-08-21 DIAGNOSIS — R509 Fever, unspecified: Secondary | ICD-10-CM

## 2021-08-21 DIAGNOSIS — K7460 Unspecified cirrhosis of liver: Secondary | ICD-10-CM | POA: Diagnosis not present

## 2021-08-21 DIAGNOSIS — K8042 Calculus of bile duct with acute cholecystitis without obstruction: Secondary | ICD-10-CM

## 2021-08-21 DIAGNOSIS — K838 Other specified diseases of biliary tract: Secondary | ICD-10-CM | POA: Diagnosis not present

## 2021-08-21 DIAGNOSIS — K8309 Other cholangitis: Secondary | ICD-10-CM | POA: Diagnosis not present

## 2021-08-21 LAB — CBC
HCT: 37.4 % — ABNORMAL LOW (ref 39.0–52.0)
Hemoglobin: 12.1 g/dL — ABNORMAL LOW (ref 13.0–17.0)
MCH: 27.1 pg (ref 26.0–34.0)
MCHC: 32.4 g/dL (ref 30.0–36.0)
MCV: 83.7 fL (ref 80.0–100.0)
Platelets: 146 10*3/uL — ABNORMAL LOW (ref 150–400)
RBC: 4.47 MIL/uL (ref 4.22–5.81)
RDW: 14.4 % (ref 11.5–15.5)
WBC: 8.7 10*3/uL (ref 4.0–10.5)
nRBC: 0 % (ref 0.0–0.2)

## 2021-08-21 LAB — COMPREHENSIVE METABOLIC PANEL
ALT: 109 U/L — ABNORMAL HIGH (ref 0–44)
AST: 75 U/L — ABNORMAL HIGH (ref 15–41)
Albumin: 3 g/dL — ABNORMAL LOW (ref 3.5–5.0)
Alkaline Phosphatase: 273 U/L — ABNORMAL HIGH (ref 38–126)
Anion gap: 12 (ref 5–15)
BUN: 15 mg/dL (ref 6–20)
CO2: 22 mmol/L (ref 22–32)
Calcium: 9.1 mg/dL (ref 8.9–10.3)
Chloride: 97 mmol/L — ABNORMAL LOW (ref 98–111)
Creatinine, Ser: 0.93 mg/dL (ref 0.61–1.24)
GFR, Estimated: >60 mL/min (ref >60)
Glucose, Bld: 75 mg/dL (ref 70–99)
Potassium: 3.8 mmol/L (ref 3.5–5.1)
Sodium: 131 mmol/L — ABNORMAL LOW (ref 135–145)
Total Bilirubin: 4.3 mg/dL — ABNORMAL HIGH (ref 0.3–1.2)
Total Protein: 6.2 g/dL — ABNORMAL LOW (ref 6.5–8.1)

## 2021-08-21 MED ORDER — AMOXICILLIN-POT CLAVULANATE 875-125 MG PO TABS
1.0000 | ORAL_TABLET | Freq: Two times a day (BID) | ORAL | 0 refills | Status: DC
  Filled 2021-08-21: qty 20, 10d supply, fill #0

## 2021-08-21 NOTE — Patient Instructions (Addendum)
Thank you for coming to see me today. It was a pleasure seeing you.  To Do: Go to GI appointment today and discuss next steps for ERCP Lab work today Start antibiotics.  I have sent Augmentin to your pharmacy  If you have any questions or concerns, please do not hesitate to call the office at [phone].  Take Care,   Jule Ser

## 2021-08-21 NOTE — Progress Notes (Signed)
[facility] for Infectious Disease  CHIEF COMPLAINT:    Follow up for FUO, abdominal pain  SUBJECTIVE:    Omar Travis is a 55 y.o. male with PMHx as below who presents to the clinic for FUO, abdominal pain.   Please see note from 08/07/21 for more details.  Unfortunately, after our visit he was seen in the ED on 11/28 due to ongoing abdominal pain.  He also had some intermittent nausea and vomiting.  Denied known fevers, however, was febrile in the ED triage area.  He is going to see GI this afternoon.  He underwent repeat CT imaging of the abd/pelvis that showed known intrahepatic biliary dilatation involving the right and caudate lobes with more involvement  of the left hepatic lobe.  This was mildly worse than on 07/22/21.  Blood cultures were drawn and labs were notable for newly elevated AST 108, ALT 77, T bili 2.0, alk phos 124.  WBC was 16.3 with left shift.  The ED discussed with my partner, Dr Linus Salmons, who recommended outpatient follow up this week and patient presents today with his wife.  He reports today that his abdominal pain continues be present with a dull ache as opposed to sharp when he was at the hospital.  Omar Travis, he had a temperature of 101.3 at 2pm and he also had chills.  Has tried imodium and other OTC options but nothing seems to make it better.   Please see A&P for the details of today's visit and status of the patient's medical problems.   Patient's Medications  New Prescriptions   AMOXICILLIN-CLAVULANATE (AUGMENTIN) 875-125 MG TABLET    Take 1 tablet by mouth 2 (two) times daily for 10 days.  Previous Medications   CANAGLIFLOZIN (INVOKANA) 300 MG TABS TABLET    Take 300 mg by mouth daily before breakfast.   DICYCLOMINE (BENTYL) 10 MG CAPSULE    Take 10 mg by mouth 3 (three) times daily as needed for spasms.   ESCITALOPRAM (LEXAPRO) 10 MG TABLET    Take 1 tablet (10 mg total) by mouth daily.   FENOFIBRATE (TRICOR) 145 MG TABLET    Take 145 mg  by mouth daily.   INSULIN GLARGINE, 1 UNIT DIAL, (TOUJEO SOLOSTAR) 300 UNIT/ML SOLOSTAR PEN    Inject 20 Units into the skin daily.   LISINOPRIL (ZESTRIL) 5 MG TABLET    TAKE ONE (1) TABLET EACH DAY   OMEPRAZOLE (PRILOSEC) 20 MG CAPSULE    Take 1 capsule (20 mg total) by mouth daily.   ONDANSETRON (ZOFRAN-ODT) 8 MG DISINTEGRATING TABLET    Take 8 mg by mouth every 8 (eight) hours as needed for nausea.   OXYCODONE (OXY IR/ROXICODONE) 5 MG IMMEDIATE RELEASE TABLET    Take 1 tablet (5 mg total) by mouth every 6 (six) hours as needed for moderate pain or severe pain (not relieved by tylenol or ibuprofen.).   ROSUVASTATIN (CRESTOR) 5 MG TABLET    Take 5 mg by mouth daily.   SEMAGLUTIDE (OZEMPIC, 1 MG/DOSE, [place])    Inject 1 mg into the skin once a week.  Modified Medications   No medications on file  Discontinued Medications   No medications on file      Past Medical History:  Diagnosis Date   Diabetes ([place])    GERD (gastroesophageal reflux disease)    HTN (hypertension)    Hyperlipidemia     Social History   Tobacco Use   Smoking status: Never  Smokeless tobacco: Never  Substance Use Topics   Alcohol use: Yes   Drug use: Never    Family History  Problem Relation Age of Onset   Diabetes Mother    Liver cancer Father    Diabetes Father     Allergies  Allergen Reactions   Benadryl Allergy [Diphenhydramine Hcl] Rash   Flexeril [Cyclobenzaprine]     Causes "breakout"    Review of Systems  Constitutional:  Positive for chills and fever.  Cardiovascular: Negative.   Gastrointestinal:  Positive for abdominal pain, nausea and vomiting.  Skin:        + Jaundice  All other systems reviewed and are negative.   OBJECTIVE:    Vitals:   08/21/21 1032  BP: 112/76  Pulse: (!) 114  Temp: 98.2 F (36.8 C)  TempSrc: Oral  Weight: 221 lb (100.2 kg)   Body mass index is 29.97 kg/m.  Physical Exam Constitutional:      General: He is not in acute distress.    Appearance:  Normal appearance.  Cardiovascular:     Rate and Rhythm: Tachycardia present.  Pulmonary:     Effort: Pulmonary effort is normal. No respiratory distress.  Abdominal:     General: There is no distension.     Palpations: Abdomen is soft.     Tenderness: There is abdominal tenderness.  Skin:    General: Skin is warm and dry.     Coloration: Skin is jaundiced.  Neurological:     General: No focal deficit present.     Mental Status: He is alert and oriented to person, place, and time.  Psychiatric:        Mood and Affect: Mood normal.        Behavior: Behavior normal.     Labs and Microbiology: CBC Latest Ref Rng & Units 08/19/2021 08/05/2021 07/28/2021  WBC 4.0 - 10.5 K/uL 16.3(H) 5.1 7.1  Hemoglobin 13.0 - 17.0 g/dL 14.2 11.4(L) 9.7(L)  Hematocrit 39.0 - 52.0 % 44.7 35.6(L) 30.5(L)  Platelets 150 - 400 K/uL 231 338.0 181   CMP Latest Ref Rng & Units 08/19/2021 08/05/2021 07/28/2021  Glucose 70 - 99 mg/dL 151(H) 90 208(H)  BUN 6 - 20 mg/dL _0 Creatinine 0.61 - 1.24 mg/dL 0.98 0.65 0.72  Sodium 135 - 145 mmol/L 131(L) 137 132(L)  Potassium 3.5 - 5.1 mmol/L 4.2 4.4 3.6  Chloride 98 - 111 mmol/L 97(L) 100 102  CO2 22 - 32 mmol/L _1 Calcium 8.9 - 10.3 mg/dL 9.6 9.5 8.2(L)  Total Protein 6.5 - 8.1 g/dL 7.8 7.9 -  Total Bilirubin 0.3 - 1.2 mg/dL 2.0(H) 0.6 -  Alkaline Phos 38 - 126 U/L 124 196(H) -  AST 15 - 41 U/L 108(H) 24 -  ALT 0 - 44 U/L 77(H) 23 -       ASSESSMENT & PLAN:    1. FUO (fever of unknown origin)  2. Choledocholithiasis with acute cholecystitis  3. Cholangitis  When I saw him last week his ESR, CRP were still elevated, however, LFTs and WBC were normalized and he had been afebrile and improved overall for several days.  He has now been seen in the ED where he was febrile in the triage area with newly elevated WBC and LFTs.  Blood cultures are pending.  CT showed some mild worsening when compared to 07/22/21.  He has continued to have pain and  fevers at home.  His presentation is overall concerning for recurrent  episode of cholangitis.  He is scheduled to see GI 130 this afternoon.  I had concern that he may need inpatient admission.  However, discussed over phone with Dr Watt Climes who recommended patient will likely need repeat ERCP which can be arranged during his clinic visit with them today.  Will get patient started on antibiotics with Augmentin today and repeat labs.  Follow up in place for 12/5 with Dr Tommy Medal.     Jenna Luo [facility] for Infectious Disease [facility] 08/21/2021, 11:03 AM  I spent 40 minutes dedicated to the care of this patient on the date of this encounter to include pre-visit review of records, face-to-face time with the patient discussing fever, cholangitis, and post-visit ordering of testing.

## 2021-08-22 ENCOUNTER — Encounter (HOSPITAL_COMMUNITY): Payer: Self-pay | Admitting: Gastroenterology

## 2021-08-22 ENCOUNTER — Ambulatory Visit (HOSPITAL_COMMUNITY)
Admission: RE | Admit: 2021-08-22 | Discharge: 2021-08-22 | Disposition: A | Discharge status: Home / Self Care | Payer: BC Managed Care – PPO | Attending: Gastroenterology | Admitting: Gastroenterology

## 2021-08-22 ENCOUNTER — Ambulatory Visit (HOSPITAL_COMMUNITY): Payer: BC Managed Care – PPO | Admitting: Certified Registered Nurse Anesthetist

## 2021-08-22 ENCOUNTER — Encounter (HOSPITAL_COMMUNITY)
Admission: RE | Disposition: A | Discharge status: Home / Self Care | Payer: Self-pay | Source: Home / Self Care | Attending: Gastroenterology

## 2021-08-22 ENCOUNTER — Other Ambulatory Visit: Payer: Self-pay

## 2021-08-22 ENCOUNTER — Ambulatory Visit (HOSPITAL_COMMUNITY): Payer: BC Managed Care – PPO

## 2021-08-22 DIAGNOSIS — E119 Type 2 diabetes mellitus without complications: Secondary | ICD-10-CM | POA: Insufficient documentation

## 2021-08-22 DIAGNOSIS — F32A Depression, unspecified: Secondary | ICD-10-CM | POA: Diagnosis not present

## 2021-08-22 DIAGNOSIS — K838 Other specified diseases of biliary tract: Secondary | ICD-10-CM | POA: Diagnosis not present

## 2021-08-22 DIAGNOSIS — K8050 Calculus of bile duct without cholangitis or cholecystitis without obstruction: Secondary | ICD-10-CM | POA: Diagnosis not present

## 2021-08-22 DIAGNOSIS — D649 Anemia, unspecified: Secondary | ICD-10-CM | POA: Diagnosis not present

## 2021-08-22 DIAGNOSIS — I10 Essential (primary) hypertension: Secondary | ICD-10-CM | POA: Diagnosis not present

## 2021-08-22 DIAGNOSIS — K219 Gastro-esophageal reflux disease without esophagitis: Secondary | ICD-10-CM | POA: Insufficient documentation

## 2021-08-22 DIAGNOSIS — K8042 Calculus of bile duct with acute cholecystitis without obstruction: Secondary | ICD-10-CM | POA: Diagnosis not present

## 2021-08-22 DIAGNOSIS — Z794 Long term (current) use of insulin: Secondary | ICD-10-CM | POA: Insufficient documentation

## 2021-08-22 DIAGNOSIS — Z419 Encounter for procedure for purposes other than remedying health state, unspecified: Secondary | ICD-10-CM

## 2021-08-22 HISTORY — PX: REMOVAL OF STONES: SHX5545

## 2021-08-22 HISTORY — PX: ERCP: SHX5425

## 2021-08-22 HISTORY — PX: SPHINCTEROTOMY: SHX5279

## 2021-08-22 LAB — GLUCOSE, CAPILLARY: Glucose-Capillary: 84 mg/dL (ref 70–99)

## 2021-08-22 SURGERY — ERCP, WITH INTERVENTION IF INDICATED
Anesthesia: General

## 2021-08-22 MED ORDER — INDOMETHACIN 50 MG RE SUPP
RECTAL | Status: AC
  Filled 2021-08-22: qty 2

## 2021-08-22 MED ORDER — PROPOFOL 500 MG/50ML IV EMUL
INTRAVENOUS | Status: AC
  Filled 2021-08-22: qty 50

## 2021-08-22 MED ORDER — SODIUM CHLORIDE 0.9 % IV SOLN
INTRAVENOUS | Status: DC | PRN
  Administered 2021-08-22: 40 mL

## 2021-08-22 MED ORDER — MIDAZOLAM HCL 2 MG/2ML IJ SOLN
INTRAMUSCULAR | Status: AC
  Filled 2021-08-22: qty 2

## 2021-08-22 MED ORDER — CIPROFLOXACIN IN D5W 400 MG/200ML IV SOLN
INTRAVENOUS | Status: DC | PRN
  Administered 2021-08-22: 400 mg via INTRAVENOUS

## 2021-08-22 MED ORDER — FENTANYL CITRATE (PF) 100 MCG/2ML IJ SOLN
INTRAMUSCULAR | Status: DC | PRN
  Administered 2021-08-22: 100 ug via INTRAVENOUS
  Administered 2021-08-22 (×2): 50 ug via INTRAVENOUS

## 2021-08-22 MED ORDER — LACTATED RINGERS IV SOLN: INTRAVENOUS | Status: DC

## 2021-08-22 MED ORDER — SODIUM CHLORIDE 0.9 % IV SOLN: INTRAVENOUS | Status: DC

## 2021-08-22 MED ORDER — DEXAMETHASONE SODIUM PHOSPHATE 10 MG/ML IJ SOLN
INTRAMUSCULAR | Status: DC | PRN
  Administered 2021-08-22: 10 mg via INTRAVENOUS

## 2021-08-22 MED ORDER — MIDAZOLAM HCL 2 MG/2ML IJ SOLN
INTRAMUSCULAR | Status: DC | PRN
Start: 2021-08-22 — End: 2021-08-22
  Administered 2021-08-22: 2 mg via INTRAVENOUS

## 2021-08-22 MED ORDER — ONDANSETRON HCL 4 MG/2ML IJ SOLN
INTRAMUSCULAR | Status: DC | PRN
  Administered 2021-08-22: 4 mg via INTRAVENOUS

## 2021-08-22 MED ORDER — FENTANYL CITRATE (PF) 100 MCG/2ML IJ SOLN
INTRAMUSCULAR | Status: AC
  Filled 2021-08-22: qty 2

## 2021-08-22 MED ORDER — SUGAMMADEX SODIUM 200 MG/2ML IV SOLN
INTRAVENOUS | Status: DC | PRN
  Administered 2021-08-22: 200 mg via INTRAVENOUS

## 2021-08-22 MED ORDER — LIDOCAINE 2% (20 MG/ML) 5 ML SYRINGE
INTRAMUSCULAR | Status: DC | PRN
  Administered 2021-08-22: 100 mg via INTRAVENOUS

## 2021-08-22 MED ORDER — GLUCAGON HCL RDNA (DIAGNOSTIC) 1 MG IJ SOLR
INTRAMUSCULAR | Status: AC
  Filled 2021-08-22: qty 1

## 2021-08-22 MED ORDER — INDOMETHACIN 50 MG RE SUPP
RECTAL | Status: DC | PRN
  Administered 2021-08-22: 100 mg via RECTAL

## 2021-08-22 MED ORDER — CIPROFLOXACIN IN D5W 400 MG/200ML IV SOLN
INTRAVENOUS | Status: AC
  Filled 2021-08-22: qty 200

## 2021-08-22 MED ORDER — PROPOFOL 10 MG/ML IV BOLUS
INTRAVENOUS | Status: DC | PRN
  Administered 2021-08-22: 200 mg via INTRAVENOUS

## 2021-08-22 MED ORDER — ROCURONIUM BROMIDE 10 MG/ML (PF) SYRINGE
PREFILLED_SYRINGE | INTRAVENOUS | Status: DC | PRN
Start: 2021-08-22 — End: 2021-08-22
  Administered 2021-08-22: 60 mg via INTRAVENOUS

## 2021-08-22 NOTE — Transfer of Care (Signed)
Immediate Anesthesia Transfer of Care Note  Patient: Omar Travis  Procedure(s) Performed: ENDOSCOPIC RETROGRADE CHOLANGIOPANCREATOGRAPHY (ERCP) REMOVAL OF STONES SPHINCTEROTOMY  Patient Location: Endoscopy Unit  Anesthesia Type:General  Level of Consciousness: awake, alert  and oriented  Airway & Oxygen Therapy: Patient Spontanous Breathing and Patient connected to face mask oxygen  Post-op Assessment: Report given to RN and Post -op Vital signs reviewed and stable  Post vital signs: Reviewed and stable  Last Vitals:  Vitals Value Taken Time  BP 129/82 08/22/21 1441  Temp    Pulse 103 08/22/21 1444  Resp 17 08/22/21 1444  SpO2 100 % 08/22/21 1444  Vitals shown include unvalidated device data.  Last Pain:  Vitals:   08/22/21 1440  TempSrc:   PainSc: Asleep         Complications: No notable events documented.

## 2021-08-22 NOTE — Discharge Instructions (Addendum)
Call if question or problem otherwise take antibiotics for 1 week and come by office next week for labs when convenient and follow-up in 2 weeks and specifically call if recurrent pain fever chills black stools or signs of GI blood loss or nausea or vomiting and stay on clear liquid diet until 8 PM and if doing well may have soft solids otherwise advance diet tomorrow and do not drive today but may advance activities tomorrow   YOU HAD AN ENDOSCOPIC PROCEDURE TODAY: Refer to the procedure report and other information in the discharge instructions given to you for any specific questions about what was found during the examination. If this information does not answer your questions, please call [facility] office at [phone] to clarify.   YOU SHOULD EXPECT: Some feelings of bloating in the abdomen. Passage of more gas than usual. Walking can help get rid of the air that was put into your GI tract during the procedure and reduce the bloating. If you had a lower endoscopy (such as a colonoscopy or flexible sigmoidoscopy) you may notice spotting of blood in your stool or on the toilet paper. Some abdominal soreness may be present for a day or two, also.  DIET: Your first meal following the procedure should be a light meal and then it is ok to progress to your normal diet. A half-sandwich or bowl of soup is an example of a good first meal. Heavy or fried foods are harder to digest and may make you feel nauseous or bloated. Drink plenty of fluids but you should avoid alcoholic beverages for 24 hours. If you had a esophageal dilation, please see attached instructions for diet.    ACTIVITY: Your care partner should take you home directly after the procedure. You should plan to take it easy, moving slowly for the rest of the day. You can resume normal activity the day after the procedure however YOU SHOULD NOT DRIVE, use power tools, machinery or perform tasks that involve climbing or major physical exertion for 24  hours (because of the sedation medicines used during the test).   SYMPTOMS TO REPORT IMMEDIATELY: A gastroenterologist can be reached at any hour. Please call [phone]  for any of the following symptoms:  Following upper endoscopy (EGD, EUS, ERCP, esophageal dilation) Vomiting of blood or coffee ground material  New, significant abdominal pain  New, significant chest pain or pain under the shoulder blades  Painful or persistently difficult swallowing  New shortness of breath  Black, tarry-looking or red, bloody stools  FOLLOW UP:  If any biopsies were taken you will be contacted by phone or by letter within the next 1-3 weeks. Call [phone]  if you have not heard about the biopsies in 3 weeks.  Please also call with any specific questions about appointments or follow up tests.

## 2021-08-22 NOTE — Anesthesia Postprocedure Evaluation (Signed)
Anesthesia Post Note  Patient: Omar Travis  Procedure(s) Performed: ENDOSCOPIC RETROGRADE CHOLANGIOPANCREATOGRAPHY (ERCP) REMOVAL OF STONES SPHINCTEROTOMY     Patient location during evaluation: PACU Anesthesia Type: General Level of consciousness: sedated Pain management: pain level controlled Vital Signs Assessment: post-procedure vital signs reviewed and stable Respiratory status: spontaneous breathing and respiratory function stable Cardiovascular status: stable Postop Assessment: no apparent nausea or vomiting Anesthetic complications: no   No notable events documented.  Last Vitals:  Vitals:   08/22/21 1500 08/22/21 1510  BP: 135/86 (!) 132/93  Pulse: (!) 107 (!) 104  Resp: 13 16  Temp:    SpO2: 96% 96%    Last Pain:  Vitals:   08/22/21 1510  TempSrc:   PainSc: 0-No pain                 Mikelle Myrick DANIEL

## 2021-08-22 NOTE — Op Note (Signed)
[facility] Patient Name: Omar Travis Procedure Date: 08/22/2021 MRN: [id number] Attending MD: Clarene Essex , MD Date of Birth: November 12, 1965 CSN: [id number] Age: 55 Admit Type: Outpatient Procedure:                ERCP Indications:              Suspected ascending cholangitis concerns over                            residual CBD stones Providers:                Clarene Essex, MD, Kary Kos RN, RN, Hinton Dyer                            Technician, Technician Referring MD:              Medicines:                General Anesthesia Complications:            No immediate complications. Estimated blood loss:                            Minimal. Estimated Blood Loss:     Estimated blood loss was minimal. Procedure:                Pre-Anesthesia Assessment:                           - Prior to the procedure, a History and Physical                            was performed, and patient medications and                            allergies were reviewed. The patient's tolerance of                            previous anesthesia was also reviewed. The risks                            and benefits of the procedure and the sedation                            options and risks were discussed with the patient.                            All questions were answered, and informed consent                            was obtained. Prior Anticoagulants: The patient has                            taken no previous anticoagulant or antiplatelet                            agents. ASA Grade Assessment: II - A  patient with                            mild systemic disease. After reviewing the risks                            and benefits, the patient was deemed in                            satisfactory condition to undergo the procedure.                           After obtaining informed consent, the scope was                            passed under direct vision. Throughout the                             procedure, the patient's blood pressure, pulse, and                            oxygen saturations were monitored continuously. The                            [employer] single use                            duodenoscope was introduced through the mouth, and                            used to inject contrast into and used to cannulate                            the bile duct. The ERCP was accomplished without                            difficulty. The patient tolerated the procedure                            well. Scope In: Scope Out: Findings:      A biliary sphincterotomy had been performed. The sphincterotomy appeared       open. The major papilla was normal. Deep selective cannulation was       readily obtained and obvious stones were seen on initial cholangiogram       and the CBD was not dilated and the biliary sphincterotomy was extended       with a Hydratome sphincterotome using ERBE electrocautery. There was       self limited oozing from the sphincterotomy which did not require       treatment. We extended the sphincterotomy site until we had adequate       biliary drainage and could get the fully bowed sphincterotome easily in       and out of the duct and the common bile duct contained Few stones, the       largest of which was  small in diameter. Choledocholithiasis was found in       a nondilated duct. To discover objects, the biliary tree was swept with       an adjustable 9- 12 mm balloon starting at the bifurcation. We only use       the 12 mm balloon multiple times and multiple pull-through's were done       and sludge was swept from the duct. All stones were removed. Multiple       negative balloon pull-through's were done and nothing was found. There       was some residual pus seen draining from the papilla and despite our       attempts we could not fill the cystic duct to see if there was any       obvious cystic duct remnant residual  stones . A few occlusion       cholangiograms were done without residual filling defects and there was       adequate biliary drainage and there was no pancreatic duct injection or       wire advancement throughout the procedure and there was no signs of       bleeding at the end of the procedure Impression:               - Prior biliary sphincterotomy appeared open.                           - The major papilla appeared normal.                           - Choledocholithiasis was found. Complete removal                            was accomplished by biliary sphincterotomy and                            balloon extraction.                           - A biliary sphincterotomy was performed.                           - The biliary tree was swept at the end of the                            procedure and nothing was found. Moderate Sedation:      Not Applicable - Patient had care per Anesthesia. Recommendation:           - Clear liquid diet for 6 hours.                           - Continue present medications. Continue                            antibiotics for 1 week                           - Return to GI clinic in 2 weeks.                           -  Telephone GI clinic if symptomatic PRN.                           - Check liver enzymes (AST, ALT, alkaline                            phosphatase, bilirubin) in 1 week in our office                            with nurse visit. Procedure Code(s):        --- Professional ---                           [phone], Endoscopic retrograde                            cholangiopancreatography (ERCP); with removal of                            calculi/debris from biliary/pancreatic duct(s)                           43262, Endoscopic retrograde                            cholangiopancreatography (ERCP); with                            sphincterotomy/papillotomy Diagnosis Code(s):        --- Professional ---                           K80.50, Calculus of  bile duct without cholangitis                            or cholecystitis without obstruction CPT copyright 2019 American Medical Association. All rights reserved. The codes documented in this report are preliminary and upon coder review may  be revised to meet current compliance requirements. Clarene Essex, MD 08/22/2021 2:59:48 PM This report has been signed electronically. Number of Addenda: 0

## 2021-08-22 NOTE — Anesthesia Preprocedure Evaluation (Signed)
Anesthesia Evaluation  Patient identified by MRN, date of birth, ID band Patient awake    Reviewed: Allergy & Precautions, NPO status , Patient's Chart, lab work & pertinent test results  History of Anesthesia Complications Negative for: history of anesthetic complications  Airway Mallampati: II  TM Distance: >3 FB Neck ROM: Full    Dental  (+) Dental Advisory Given, Teeth Intact   Pulmonary neg pulmonary ROS,    breath sounds clear to auscultation       Cardiovascular hypertension, Pt. on medications  Rhythm:Regular     Neuro/Psych PSYCHIATRIC DISORDERS Depression negative neurological ROS     GI/Hepatic Neg liver ROS, GERD  ,Dilation of biliary tract   Endo/Other  diabetes, Insulin Dependent  Renal/GU negative Renal ROS     Musculoskeletal negative musculoskeletal ROS (+)   Abdominal   Peds  Hematology  (+) Blood dyscrasia, anemia , Lab Results      Component                Value               Date                      WBC                      8.7                 08/21/2021                HGB                      12.1 (L)            08/21/2021                HCT                      37.4 (L)            08/21/2021                MCV                      83.7                08/21/2021                PLT                      146 (L)             08/21/2021              Anesthesia Other Findings   Reproductive/Obstetrics                             Anesthesia Physical Anesthesia Plan  ASA: 2  Anesthesia Plan: General   Post-op Pain Management:    Induction: Intravenous  PONV Risk Score and Plan: 2 and Dexamethasone and Ondansetron  Airway Management Planned: Oral ETT  Additional Equipment: None  Intra-op Plan:   Post-operative Plan: Extubation in OR  Informed Consent: I have reviewed the patients History and Physical, chart, labs and discussed the procedure including the  risks, benefits and alternatives for the proposed anesthesia with the patient or authorized representative who has indicated his/her understanding and  acceptance.     Dental advisory given  Plan Discussed with: CRNA and Anesthesiologist  Anesthesia Plan Comments:         Anesthesia Quick Evaluation

## 2021-08-22 NOTE — Progress Notes (Signed)
Omar Travis 1:48 PM  Subjective: Patient seen and examined in hospital computer chart reviewed and case discussed yesterday with his infectious disease doctor as well as our PA who saw him in the office and recently with the surgery team and his history was confirmed and reviewed and other than recurrent midepigastric pain and fever periodically he has no other complaints  Objective: Vital signs stable afebrile no acute distress exam please see preassessment evaluation labs reviewed increased liver test CT reviewed okay  Assessment: Concerns regarding residual CBD stone and cholangitis  Plan: Okay to proceed with ERCP with anesthesia assistance  [facility]  office [phone] After 5PM or if no answer call [phone]

## 2021-08-22 NOTE — Anesthesia Procedure Notes (Signed)
Procedure Name: Intubation Date/Time: 08/22/2021 1:42 PM Performed by: Genelle Bal, CRNA Pre-anesthesia Checklist: Patient identified, Emergency Drugs available, Suction available and Patient being monitored Patient Re-evaluated:Patient Re-evaluated prior to induction Oxygen Delivery Method: Circle system utilized Preoxygenation: Pre-oxygenation with 100% oxygen Induction Type: IV induction Ventilation: Mask ventilation without difficulty Laryngoscope Size: Miller and 2 Grade View: Grade I Tube type: Oral Tube size: 7.5 mm Number of attempts: 1 Airway Equipment and Method: Stylet and Oral airway Placement Confirmation: ETT inserted through vocal cords under direct vision, positive ETCO2 and breath sounds checked- equal and bilateral Secured at: 22 cm Tube secured with: Tape Dental Injury: Teeth and Oropharynx as per pre-operative assessment

## 2021-08-25 ENCOUNTER — Encounter (HOSPITAL_COMMUNITY): Payer: Self-pay | Admitting: Gastroenterology

## 2021-08-25 LAB — CULTURE, BLOOD (ROUTINE X 2)
Culture: NO GROWTH
Culture: NO GROWTH
Special Requests: ADEQUATE
Special Requests: ADEQUATE

## 2021-08-26 ENCOUNTER — Encounter: Payer: Self-pay | Admitting: Infectious Disease

## 2021-08-26 ENCOUNTER — Other Ambulatory Visit: Payer: Self-pay

## 2021-08-26 ENCOUNTER — Ambulatory Visit (INDEPENDENT_AMBULATORY_CARE_PROVIDER_SITE_OTHER): Payer: BC Managed Care – PPO | Admitting: Infectious Disease

## 2021-08-26 VITALS — BP 117/82 | HR 104 | Temp 98.7°F | Wt 222.0 lb

## 2021-08-26 DIAGNOSIS — K8309 Other cholangitis: Secondary | ICD-10-CM

## 2021-08-26 DIAGNOSIS — R531 Weakness: Secondary | ICD-10-CM

## 2021-08-26 DIAGNOSIS — I10 Essential (primary) hypertension: Secondary | ICD-10-CM

## 2021-08-26 DIAGNOSIS — R509 Fever, unspecified: Secondary | ICD-10-CM

## 2021-08-26 DIAGNOSIS — K8042 Calculus of bile duct with acute cholecystitis without obstruction: Secondary | ICD-10-CM

## 2021-08-26 MED ORDER — METRONIDAZOLE 500 MG PO TABS: 500.0000 mg | ORAL_TABLET | Freq: Two times a day (BID) | ORAL | 2 refills | Status: DC

## 2021-08-26 MED ORDER — CEFDINIR 300 MG PO CAPS: 300.0000 mg | ORAL_CAPSULE | Freq: Two times a day (BID) | ORAL | 1 refills | Status: DC

## 2021-08-26 NOTE — Progress Notes (Signed)
Subjective:  Chief complaint continuing problems with fevers chills and pain especially in his epigastric region    Patient ID: Omar Travis, male    DOB: [birth year], 55 y.o.   MRN: [id number]  HPI  Omar Travis is a 55 year old Caucasian man who underwent laparoscopic cholecystectomy and pancreatic stenting who then developed postoperative fevers.  He had blood cultures that were negative CT and MRCP that were unremarkable at the time.    It seemed reasonable to assume that the source of his fevers was the biliary tree given his recent surgery with cholecystectomy and stenting of the pancreas.  However as mentioned imaging at the time was unremarkable.  We did also however performed extensive laboratory evaluation of the patient in the hospital for both infectious and noninfectious causes of fevers and these were all unremarkable.  He did however have markedly elevated inflammatory markers suggested that he had a significant element Tory process in place.  He has been seen by my partner Dr. Juleen China twice and was seen in the ER where he had labs concerning for choledocholithiasis with cholecystitis.   Patient was seen by Dr. Watt Climes and had repeat ERCP on December 1.  In the interim he was placed on Augmentin which he continued to present date.  On Friday the day after his ERCP during which multiple stones were removed he was feeling much more like himself again but then in the evening on Friday he began having chills epigastric pain and a temperature using a temporal thermometer which was 101.5.   He continues to suffer from subjective and objective fevers on a nearly daily basis despite Augmentin and despite the recent ERCP.  His only other focal signs are his abdominal pain in the epigastric region as well as in the right upper quadrant right lower quadrant.    Past Medical History:  Diagnosis Date   Diabetes ([place])    GERD (gastroesophageal reflux disease)    HTN (hypertension)     Hyperlipidemia     Past Surgical History:  Procedure Laterality Date   CHOLECYSTECTOMY N/A 07/17/2021   Procedure: LAPAROSCOPIC CHOLECYSTECTOMY;  Surgeon: Clovis Riley, MD;  Location: [place] [facility];  Service: General;  Laterality: N/A;   ENDOSCOPIC RETROGRADE CHOLANGIOPANCREATOGRAPHY (ERCP) WITH PROPOFOL N/A 07/16/2021   Procedure: ENDOSCOPIC RETROGRADE CHOLANGIOPANCREATOGRAPHY (ERCP) WITH PROPOFOL;  Surgeon: Clarene Essex, MD;  Location: [place];  Service: Endoscopy;  Laterality: N/A;   ERCP N/A 08/22/2021   Procedure: ENDOSCOPIC RETROGRADE CHOLANGIOPANCREATOGRAPHY (ERCP);  Surgeon: Clarene Essex, MD;  Location: [facility];  Service: Endoscopy;  Laterality: N/A;   PANCREATIC STENT PLACEMENT  07/16/2021   Procedure: PANCREATIC STENT PLACEMENT;  Surgeon: Clarene Essex, MD;  Location: [place];  Service: Endoscopy;;   REMOVAL OF STONES  07/16/2021   Procedure: REMOVAL OF STONES;  Surgeon: Clarene Essex, MD;  Location: [place];  Service: Endoscopy;;   REMOVAL OF STONES  08/22/2021   Procedure: REMOVAL OF STONES;  Surgeon: Clarene Essex, MD;  Location: [facility];  Service: Endoscopy;;   SPHINCTEROTOMY  07/16/2021   Procedure: Joan Mayans;  Surgeon: Clarene Essex, MD;  Location: [facility];  Service: Endoscopy;;   SPHINCTEROTOMY  08/22/2021   Procedure: Joan Mayans;  Surgeon: Clarene Essex, MD;  Location: [facility];  Service: Endoscopy;;    Family History  Problem Relation Age of Onset   Diabetes Mother    Liver cancer Father    Diabetes Father       Social History   Socioeconomic History   Marital status: Married  Spouse name: Not on file   Number of children: Not on file   Years of education: Not on file   Highest education level: Not on file  Occupational History   Not on file  Tobacco Use   Smoking status: Never   Smokeless tobacco: Never  Substance and Sexual Activity   Alcohol use: Yes   Drug use: Never   Sexual activity: Yes  Other Topics Concern   Not  on file  Social History Narrative   Not on file   Social Determinants of Health   Financial Resource Strain: Not on file  Food Insecurity: Not on file  Transportation Needs: Not on file  Physical Activity: Not on file  Stress: Not on file  Social Connections: Not on file    Allergies  Allergen Reactions   Benadryl Allergy [Diphenhydramine Hcl] Rash   Flexeril [Cyclobenzaprine] Rash     Current Outpatient Medications:    amoxicillin-clavulanate (AUGMENTIN) 875-125 MG tablet, Take 1 tablet by mouth 2 (two) times daily for 10 days., Disp: 20 tablet, Rfl: 0   canagliflozin (INVOKANA) 300 MG TABS tablet, Take 300 mg by mouth daily before breakfast., Disp: , Rfl:    escitalopram (LEXAPRO) 10 MG tablet, Take 1 tablet (10 mg total) by mouth daily., Disp: 30 tablet, Rfl: 4   fenofibrate (TRICOR) 145 MG tablet, Take 145 mg by mouth daily., Disp: , Rfl:    ibuprofen (ADVIL) 200 MG tablet, Take 400-600 mg by mouth every 6 (six) hours as needed (pain/fever)., Disp: , Rfl:    insulin glargine, 1 Unit Dial, (TOUJEO SOLOSTAR) 300 UNIT/ML Solostar Pen, Inject 20 Units into the skin daily. (Patient taking differently: Inject 30 Units into the skin at bedtime.), Disp: , Rfl:    lisinopril (ZESTRIL) 5 MG tablet, TAKE ONE (1) TABLET EACH DAY (Patient taking differently: Take 5 mg by mouth daily.), Disp: 90 tablet, Rfl: 1   omeprazole (PRILOSEC) 20 MG capsule, Take 1 capsule (20 mg total) by mouth daily., Disp: 90 capsule, Rfl: 3   ondansetron (ZOFRAN-ODT) 8 MG disintegrating tablet, Take 8 mg by mouth every 8 (eight) hours as needed for nausea., Disp: , Rfl:    OZEMPIC, 1 MG/DOSE, 4 MG/3ML SOPN, Inject 1 mg into the skin every Monday., Disp: , Rfl:    rosuvastatin (CRESTOR) 10 MG tablet, Take 10 mg by mouth at bedtime., Disp: , Rfl:    oxyCODONE (OXY IR/ROXICODONE) 5 MG immediate release tablet, Take 1 tablet (5 mg total) by mouth every 6 (six) hours as needed for moderate pain or severe pain (not  relieved by tylenol or ibuprofen.). (Patient not taking: Reported on 08/21/2021), Disp: 15 tablet, Rfl: 0   Review of Systems  Constitutional:  Positive for chills, fatigue and fever. Negative for activity change, appetite change, diaphoresis and unexpected weight change.  HENT:  Negative for congestion, rhinorrhea, sinus pressure, sneezing, sore throat and trouble swallowing.   Eyes:  Negative for photophobia and visual disturbance.  Respiratory:  Negative for cough, chest tightness, shortness of breath, wheezing and stridor.   Cardiovascular:  Negative for chest pain, palpitations and leg swelling.  Gastrointestinal:  Positive for abdominal pain. Negative for abdominal distention, anal bleeding, blood in stool, constipation, diarrhea, nausea and vomiting.  Genitourinary:  Negative for difficulty urinating, dysuria, flank pain and hematuria.  Musculoskeletal:  Negative for arthralgias, back pain, gait problem, joint swelling and myalgias.  Skin:  Negative for color change, pallor, rash and wound.  Neurological:  Negative for dizziness, tremors, weakness  and light-headedness.  Hematological:  Negative for adenopathy. Does not bruise/bleed easily.  Psychiatric/Behavioral:  Negative for agitation, behavioral problems, confusion, decreased concentration, dysphoric mood and sleep disturbance.       Objective:   Physical Exam Constitutional:      Appearance: He is well-developed.  HENT:     Head: Normocephalic and atraumatic.  Eyes:     General:        Right eye: No discharge.        Left eye: No discharge.     Conjunctiva/sclera: Conjunctivae normal.  Cardiovascular:     Rate and Rhythm: Normal rate and regular rhythm.  Pulmonary:     Effort: Pulmonary effort is normal. No respiratory distress.     Breath sounds: No stridor. No wheezing or rhonchi.  Abdominal:     General: There is no distension.     Palpations: Abdomen is soft.     Tenderness: There is abdominal tenderness in the  right upper quadrant and epigastric area.  Musculoskeletal:        General: No tenderness. Normal range of motion.     Cervical back: Normal range of motion and neck supple.  Skin:    General: Skin is warm and dry.     Coloration: Skin is not pale.     Findings: No erythema or rash.  Neurological:     General: No focal deficit present.     Mental Status: He is alert and oriented to person, place, and time.  Psychiatric:        Mood and Affect: Mood normal.        Behavior: Behavior normal.        Thought Content: Thought content normal.        Judgment: Judgment normal.          Assessment & Plan:   Ongoing fevers: I continue to suspect the biliary tree and his abdomen is being the source.  Peers he has had 4 CTs since the end of October of the abdomen and pelvis.  I will check CBC with differential sed rate CRP CMP as well as lipase and amylase.  I am also reaching out to Dr. Watt Climes to discuss this case.  Perhaps he needs to undergo yet another ERCP?  Otherwise I am afraid he will need yet another CT of abdomen and pelvis  I cannot think of another way of assessing this area unless he were to have exploratory laparotomy.  I will change his antibiotics to cefdinir and metronidazole in case there is a pathogen in the biliary tree that is causing fevers that is resistant to ampicillin clavulanic acid  Choledocholithiasis: As mentioned had recent ERCP has had stent in of the pancreas as well.  I have written him a note to allow him to not have to return to we are work yet since he was scheduled to go to work tomorrow and is still having ongoing fevers   I spent 38 with the patient including face to face counseling of the patient personally reviewing CT abdomen pelvis performed on August 19, 2021, along with CBC CMP and with   review of medical records before and during the visit and in coordination of his care with Dr. Watt Climes.

## 2021-08-27 ENCOUNTER — Telehealth: Payer: Self-pay | Admitting: Infectious Disease

## 2021-08-27 ENCOUNTER — Telehealth: Payer: Self-pay

## 2021-08-27 ENCOUNTER — Encounter: Payer: Self-pay | Admitting: Family Medicine

## 2021-08-27 ENCOUNTER — Encounter: Payer: Self-pay | Admitting: Infectious Disease

## 2021-08-27 DIAGNOSIS — K8042 Calculus of bile duct with acute cholecystitis without obstruction: Secondary | ICD-10-CM

## 2021-08-27 LAB — LIPASE: Lipase: 21 U/L (ref 7–60)

## 2021-08-27 LAB — COMPLETE METABOLIC PANEL WITH GFR
AG Ratio: 1.1 (calc) (ref 1.0–2.5)
ALT: 117 U/L — ABNORMAL HIGH (ref 9–46)
AST: 94 U/L — ABNORMAL HIGH (ref 10–35)
Albumin: 3.3 g/dL — ABNORMAL LOW (ref 3.6–5.1)
Alkaline phosphatase (APISO): 507 U/L — ABNORMAL HIGH (ref 35–144)
BUN/Creatinine Ratio: 18 (calc) (ref 6–22)
BUN: 10 mg/dL (ref 7–25)
CO2: 25 mmol/L (ref 20–32)
Calcium: 9 mg/dL (ref 8.6–10.3)
Chloride: 98 mmol/L (ref 98–110)
Creat: 0.57 mg/dL — ABNORMAL LOW (ref 0.70–1.30)
Globulin: 3 g/dL (calc) (ref 1.9–3.7)
Glucose, Bld: 88 mg/dL (ref 65–99)
Potassium: 4 mmol/L (ref 3.5–5.3)
Sodium: 137 mmol/L (ref 135–146)
Total Bilirubin: 2.6 mg/dL — ABNORMAL HIGH (ref 0.2–1.2)
Total Protein: 6.3 g/dL (ref 6.1–8.1)
eGFR: 117 mL/min/{1.73_m2} (ref >=60)

## 2021-08-27 LAB — CBC WITH DIFFERENTIAL/PLATELET
Absolute Monocytes: 538 cells/uL (ref 200–950)
Basophils Absolute: 17 cells/uL (ref 0–200)
Basophils Relative: 0.2 %
Eosinophils Absolute: 67 cells/uL (ref 15–500)
Eosinophils Relative: 0.8 %
HCT: 35.6 % — ABNORMAL LOW (ref 38.5–50.0)
Hemoglobin: 11.6 g/dL — ABNORMAL LOW (ref 13.2–17.1)
Lymphs Abs: 1100 cells/uL (ref 850–3900)
MCH: 26.8 pg — ABNORMAL LOW (ref 27.0–33.0)
MCHC: 32.6 g/dL (ref 32.0–36.0)
MCV: 82.2 fL (ref 80.0–100.0)
MPV: 11.5 fL (ref 7.5–12.5)
Monocytes Relative: 6.4 %
Neutro Abs: 6678 cells/uL (ref 1500–7800)
Neutrophils Relative %: 79.5 %
Platelets: 202 10*3/uL (ref 140–400)
RBC: 4.33 10*6/uL (ref 4.20–5.80)
RDW: 14 % (ref 11.0–15.0)
Total Lymphocyte: 13.1 %
WBC: 8.4 10*3/uL (ref 3.8–10.8)

## 2021-08-27 LAB — C-REACTIVE PROTEIN: CRP: 159.2 mg/L — ABNORMAL HIGH (ref <8.0)

## 2021-08-27 LAB — AMYLASE: Amylase: 34 U/L (ref 21–101)

## 2021-08-27 LAB — SEDIMENTATION RATE: Sed Rate: 94 mm/h — ABNORMAL HIGH (ref 0–20)

## 2021-08-27 NOTE — Telephone Encounter (Signed)
Patient made aware via MyChart message.   Beryle Flock, RN

## 2021-08-27 NOTE — Telephone Encounter (Signed)
Spoke with patient, relayed that his liver function tests are elevated and that Dr. Tommy Medal would like for him to follow up with Dr. Watt Climes for another ERCP. Patient verbalized understanding and has no further questions.   Beryle Flock, RN

## 2021-08-27 NOTE — Telephone Encounter (Signed)
Dr Watt Climes recommends MRCP which I am ordering

## 2021-08-27 NOTE — Telephone Encounter (Signed)
-----   Message from Truman Hayward, MD sent at 08/27/2021  8:19 AM EST ----- Regarding: pts lfts are up again he needs to see [facility] for another ERCP  ----- Message ----- From: Interface, [facility] Lab Results In Sent: 08/26/2021   3:49 PM EST To: Truman Hayward, MD

## 2021-08-29 ENCOUNTER — Ambulatory Visit (HOSPITAL_COMMUNITY)
Admission: RE | Admit: 2021-08-29 | Discharge: 2021-08-29 | Disposition: A | Discharge status: Home / Self Care | Payer: BC Managed Care – PPO | Source: Ambulatory Visit | Attending: Infectious Disease | Admitting: Infectious Disease

## 2021-08-29 ENCOUNTER — Other Ambulatory Visit: Payer: Self-pay | Admitting: Infectious Disease

## 2021-08-29 ENCOUNTER — Telehealth: Payer: Self-pay

## 2021-08-29 DIAGNOSIS — Z794 Long term (current) use of insulin: Secondary | ICD-10-CM | POA: Diagnosis not present

## 2021-08-29 DIAGNOSIS — Z833 Family history of diabetes mellitus: Secondary | ICD-10-CM | POA: Diagnosis not present

## 2021-08-29 DIAGNOSIS — K7200 Acute and subacute hepatic failure without coma: Secondary | ICD-10-CM | POA: Diagnosis not present

## 2021-08-29 DIAGNOSIS — R6520 Severe sepsis without septic shock: Secondary | ICD-10-CM | POA: Diagnosis not present

## 2021-08-29 DIAGNOSIS — K8309 Other cholangitis: Secondary | ICD-10-CM | POA: Diagnosis not present

## 2021-08-29 DIAGNOSIS — I10 Essential (primary) hypertension: Secondary | ICD-10-CM | POA: Diagnosis not present

## 2021-08-29 DIAGNOSIS — Z800 Family history of malignant neoplasm of digestive organs: Secondary | ICD-10-CM | POA: Diagnosis not present

## 2021-08-29 DIAGNOSIS — K8080 Other cholelithiasis without obstruction: Secondary | ICD-10-CM | POA: Diagnosis not present

## 2021-08-29 DIAGNOSIS — K8050 Calculus of bile duct without cholangitis or cholecystitis without obstruction: Secondary | ICD-10-CM | POA: Diagnosis not present

## 2021-08-29 DIAGNOSIS — E785 Hyperlipidemia, unspecified: Secondary | ICD-10-CM | POA: Diagnosis not present

## 2021-08-29 DIAGNOSIS — K8031 Calculus of bile duct with cholangitis, unspecified, with obstruction: Secondary | ICD-10-CM | POA: Diagnosis not present

## 2021-08-29 DIAGNOSIS — Z9049 Acquired absence of other specified parts of digestive tract: Secondary | ICD-10-CM | POA: Diagnosis not present

## 2021-08-29 DIAGNOSIS — A419 Sepsis, unspecified organism: Secondary | ICD-10-CM | POA: Diagnosis not present

## 2021-08-29 DIAGNOSIS — R932 Abnormal findings on diagnostic imaging of liver and biliary tract: Secondary | ICD-10-CM | POA: Diagnosis not present

## 2021-08-29 DIAGNOSIS — K7460 Unspecified cirrhosis of liver: Secondary | ICD-10-CM | POA: Diagnosis not present

## 2021-08-29 DIAGNOSIS — Z79899 Other long term (current) drug therapy: Secondary | ICD-10-CM | POA: Diagnosis not present

## 2021-08-29 DIAGNOSIS — Z20822 Contact with and (suspected) exposure to covid-19: Secondary | ICD-10-CM | POA: Diagnosis not present

## 2021-08-29 DIAGNOSIS — R509 Fever, unspecified: Secondary | ICD-10-CM | POA: Diagnosis not present

## 2021-08-29 DIAGNOSIS — R17 Unspecified jaundice: Secondary | ICD-10-CM | POA: Diagnosis not present

## 2021-08-29 DIAGNOSIS — K838 Other specified diseases of biliary tract: Secondary | ICD-10-CM | POA: Diagnosis not present

## 2021-08-29 DIAGNOSIS — E1169 Type 2 diabetes mellitus with other specified complication: Secondary | ICD-10-CM | POA: Diagnosis not present

## 2021-08-29 DIAGNOSIS — K8030 Calculus of bile duct with cholangitis, unspecified, without obstruction: Secondary | ICD-10-CM | POA: Diagnosis not present

## 2021-08-29 DIAGNOSIS — K219 Gastro-esophageal reflux disease without esophagitis: Secondary | ICD-10-CM | POA: Diagnosis not present

## 2021-08-29 DIAGNOSIS — K766 Portal hypertension: Secondary | ICD-10-CM | POA: Diagnosis not present

## 2021-08-29 DIAGNOSIS — K8042 Calculus of bile duct with acute cholecystitis without obstruction: Secondary | ICD-10-CM

## 2021-08-29 DIAGNOSIS — R748 Abnormal levels of other serum enzymes: Secondary | ICD-10-CM | POA: Diagnosis not present

## 2021-08-29 DIAGNOSIS — E11649 Type 2 diabetes mellitus with hypoglycemia without coma: Secondary | ICD-10-CM | POA: Diagnosis not present

## 2021-08-29 DIAGNOSIS — R7989 Other specified abnormal findings of blood chemistry: Secondary | ICD-10-CM | POA: Diagnosis not present

## 2021-08-29 DIAGNOSIS — N281 Cyst of kidney, acquired: Secondary | ICD-10-CM | POA: Diagnosis not present

## 2021-08-29 DIAGNOSIS — Z888 Allergy status to other drugs, medicaments and biological substances status: Secondary | ICD-10-CM | POA: Diagnosis not present

## 2021-08-29 DIAGNOSIS — K750 Abscess of liver: Secondary | ICD-10-CM | POA: Diagnosis not present

## 2021-08-29 DIAGNOSIS — K8051 Calculus of bile duct without cholangitis or cholecystitis with obstruction: Secondary | ICD-10-CM | POA: Diagnosis not present

## 2021-08-29 DIAGNOSIS — K7469 Other cirrhosis of liver: Secondary | ICD-10-CM | POA: Diagnosis not present

## 2021-08-29 DIAGNOSIS — R1011 Right upper quadrant pain: Secondary | ICD-10-CM | POA: Diagnosis not present

## 2021-08-29 DIAGNOSIS — R161 Splenomegaly, not elsewhere classified: Secondary | ICD-10-CM | POA: Diagnosis not present

## 2021-08-29 MED ORDER — GADOBUTROL 1 MMOL/ML IV SOLN
100.0000 mL | Freq: Once | INTRAVENOUS | Status: AC | PRN
  Administered 2021-08-29: 10 mL via INTRAVENOUS

## 2021-08-29 NOTE — Telephone Encounter (Signed)
Per MD faxed office note from 12/5  and STD forms to [employer]. Form has been completed by MD. Will update patient. Fax confirmation received. Will leave copy of completed form with Front Desk for patient if needed. Leatrice Jewels, RMA

## 2021-08-30 ENCOUNTER — Inpatient Hospital Stay (HOSPITAL_COMMUNITY)
Admission: AD | Admit: 2021-08-30 | Discharge: 2021-09-04 | DRG: 871 | Disposition: A | Discharge status: Home / Self Care | Payer: BC Managed Care – PPO | Source: Ambulatory Visit | Attending: Internal Medicine | Admitting: Internal Medicine

## 2021-08-30 ENCOUNTER — Other Ambulatory Visit: Payer: Self-pay

## 2021-08-30 ENCOUNTER — Encounter: Payer: Self-pay | Admitting: Infectious Disease

## 2021-08-30 ENCOUNTER — Ambulatory Visit (INDEPENDENT_AMBULATORY_CARE_PROVIDER_SITE_OTHER): Payer: BC Managed Care – PPO | Admitting: Infectious Disease

## 2021-08-30 ENCOUNTER — Encounter (HOSPITAL_COMMUNITY): Payer: Self-pay | Admitting: Internal Medicine

## 2021-08-30 ENCOUNTER — Telehealth: Payer: Self-pay

## 2021-08-30 VITALS — BP 117/77 | HR 117 | Temp 101.3°F | Wt 221.0 lb

## 2021-08-30 DIAGNOSIS — A419 Sepsis, unspecified organism: Secondary | ICD-10-CM | POA: Diagnosis present

## 2021-08-30 DIAGNOSIS — Z833 Family history of diabetes mellitus: Secondary | ICD-10-CM

## 2021-08-30 DIAGNOSIS — Z9049 Acquired absence of other specified parts of digestive tract: Secondary | ICD-10-CM | POA: Diagnosis not present

## 2021-08-30 DIAGNOSIS — Z800 Family history of malignant neoplasm of digestive organs: Secondary | ICD-10-CM

## 2021-08-30 DIAGNOSIS — K8030 Calculus of bile duct with cholangitis, unspecified, without obstruction: Secondary | ICD-10-CM | POA: Diagnosis present

## 2021-08-30 DIAGNOSIS — K7460 Unspecified cirrhosis of liver: Secondary | ICD-10-CM

## 2021-08-30 DIAGNOSIS — E11649 Type 2 diabetes mellitus with hypoglycemia without coma: Secondary | ICD-10-CM | POA: Diagnosis present

## 2021-08-30 DIAGNOSIS — E871 Hypo-osmolality and hyponatremia: Secondary | ICD-10-CM | POA: Diagnosis present

## 2021-08-30 DIAGNOSIS — K7469 Other cirrhosis of liver: Secondary | ICD-10-CM | POA: Diagnosis present

## 2021-08-30 DIAGNOSIS — Z79899 Other long term (current) drug therapy: Secondary | ICD-10-CM | POA: Diagnosis not present

## 2021-08-30 DIAGNOSIS — K750 Abscess of liver: Secondary | ICD-10-CM | POA: Diagnosis present

## 2021-08-30 DIAGNOSIS — R161 Splenomegaly, not elsewhere classified: Secondary | ICD-10-CM

## 2021-08-30 DIAGNOSIS — E785 Hyperlipidemia, unspecified: Secondary | ICD-10-CM | POA: Diagnosis present

## 2021-08-30 DIAGNOSIS — K8050 Calculus of bile duct without cholangitis or cholecystitis without obstruction: Secondary | ICD-10-CM

## 2021-08-30 DIAGNOSIS — I10 Essential (primary) hypertension: Secondary | ICD-10-CM | POA: Diagnosis present

## 2021-08-30 DIAGNOSIS — Z20822 Contact with and (suspected) exposure to covid-19: Secondary | ICD-10-CM | POA: Diagnosis present

## 2021-08-30 DIAGNOSIS — E119 Type 2 diabetes mellitus without complications: Secondary | ICD-10-CM

## 2021-08-30 DIAGNOSIS — R6520 Severe sepsis without septic shock: Secondary | ICD-10-CM | POA: Diagnosis not present

## 2021-08-30 DIAGNOSIS — Z794 Long term (current) use of insulin: Secondary | ICD-10-CM | POA: Diagnosis not present

## 2021-08-30 DIAGNOSIS — K219 Gastro-esophageal reflux disease without esophagitis: Secondary | ICD-10-CM | POA: Diagnosis present

## 2021-08-30 DIAGNOSIS — K8309 Other cholangitis: Secondary | ICD-10-CM

## 2021-08-30 DIAGNOSIS — Z888 Allergy status to other drugs, medicaments and biological substances status: Secondary | ICD-10-CM

## 2021-08-30 DIAGNOSIS — K766 Portal hypertension: Secondary | ICD-10-CM | POA: Diagnosis present

## 2021-08-30 DIAGNOSIS — R509 Fever, unspecified: Secondary | ICD-10-CM | POA: Diagnosis not present

## 2021-08-30 DIAGNOSIS — R7989 Other specified abnormal findings of blood chemistry: Secondary | ICD-10-CM | POA: Diagnosis not present

## 2021-08-30 DIAGNOSIS — E1169 Type 2 diabetes mellitus with other specified complication: Secondary | ICD-10-CM | POA: Diagnosis not present

## 2021-08-30 DIAGNOSIS — K7200 Acute and subacute hepatic failure without coma: Secondary | ICD-10-CM | POA: Diagnosis not present

## 2021-08-30 DIAGNOSIS — K8042 Calculus of bile duct with acute cholecystitis without obstruction: Secondary | ICD-10-CM

## 2021-08-30 HISTORY — DX: Abscess of liver: K75.0

## 2021-08-30 LAB — RESP PANEL BY RT-PCR (FLU A&B, COVID) ARPGX2
Influenza A by PCR: NEGATIVE
Influenza B by PCR: NEGATIVE
SARS Coronavirus 2 by RT PCR: NEGATIVE

## 2021-08-30 LAB — CBC
HCT: 31.5 % — ABNORMAL LOW (ref 39.0–52.0)
Hemoglobin: 10.3 g/dL — ABNORMAL LOW (ref 13.0–17.0)
MCH: 26.4 pg (ref 26.0–34.0)
MCHC: 32.7 g/dL (ref 30.0–36.0)
MCV: 80.8 fL (ref 80.0–100.0)
Platelets: 318 K/uL (ref 150–400)
RBC: 3.9 MIL/uL — ABNORMAL LOW (ref 4.22–5.81)
RDW: 15.9 % — ABNORMAL HIGH (ref 11.5–15.5)
WBC: 10.6 K/uL — ABNORMAL HIGH (ref 4.0–10.5)
nRBC: 0 % (ref 0.0–0.2)

## 2021-08-30 LAB — GLUCOSE, CAPILLARY: Glucose-Capillary: 94 mg/dL (ref 70–99)

## 2021-08-30 MED ORDER — ACETAMINOPHEN 325 MG PO TABS
650.0000 mg | ORAL_TABLET | Freq: Four times a day (QID) | ORAL | Status: DC | PRN
  Administered 2021-08-30 – 2021-08-31 (×2): 650 mg via ORAL
  Filled 2021-08-30 (×2): qty 2

## 2021-08-30 MED ORDER — ESCITALOPRAM OXALATE 10 MG PO TABS
10.0000 mg | ORAL_TABLET | Freq: Every day | ORAL | Status: DC
  Administered 2021-08-31 – 2021-09-04 (×5): 10 mg via ORAL
  Filled 2021-08-30 (×5): qty 1

## 2021-08-30 MED ORDER — ONDANSETRON HCL 4 MG/2ML IJ SOLN
4.0000 mg | Freq: Four times a day (QID) | INTRAMUSCULAR | Status: DC | PRN
  Administered 2021-09-01: 4 mg via INTRAVENOUS
  Filled 2021-08-30: qty 2

## 2021-08-30 MED ORDER — PIPERACILLIN-TAZOBACTAM 3.375 G IVPB
3.3750 g | Freq: Three times a day (TID) | INTRAVENOUS | Status: DC
Start: 2021-08-30 — End: 2021-08-31
  Administered 2021-08-30 – 2021-08-31 (×2): 3.375 g via INTRAVENOUS
  Filled 2021-08-30 (×2): qty 50

## 2021-08-30 MED ORDER — INSULIN ASPART 100 UNIT/ML IJ SOLN
0.0000 [IU] | Freq: Three times a day (TID) | INTRAMUSCULAR | Status: DC
Start: 2021-08-31 — End: 2021-09-04
  Administered 2021-08-31: 2 [IU] via SUBCUTANEOUS
  Administered 2021-09-01: 14:00:00 3 [IU] via SUBCUTANEOUS
  Administered 2021-09-02: 1 [IU] via SUBCUTANEOUS
  Administered 2021-09-02 – 2021-09-03 (×2): 2 [IU] via SUBCUTANEOUS
  Administered 2021-09-03 – 2021-09-04 (×3): 3 [IU] via SUBCUTANEOUS

## 2021-08-30 MED ORDER — LISINOPRIL 5 MG PO TABS
5.0000 mg | ORAL_TABLET | Freq: Every day | ORAL | Status: DC
  Administered 2021-08-31: 5 mg via ORAL
  Filled 2021-08-30: qty 1

## 2021-08-30 MED ORDER — INSULIN ASPART 100 UNIT/ML IJ SOLN: 0.0000 [IU] | Freq: Every day | INTRAMUSCULAR | Status: DC

## 2021-08-30 MED ORDER — ONDANSETRON HCL 4 MG PO TABS: 4.0000 mg | ORAL_TABLET | Freq: Four times a day (QID) | ORAL | Status: DC | PRN

## 2021-08-30 MED ORDER — ROSUVASTATIN CALCIUM 5 MG PO TABS
10.0000 mg | ORAL_TABLET | Freq: Every day | ORAL | Status: DC
  Administered 2021-08-30 – 2021-09-03 (×5): 10 mg via ORAL
  Filled 2021-08-30 (×5): qty 2

## 2021-08-30 MED ORDER — ACETAMINOPHEN 650 MG RE SUPP: 650.0000 mg | Freq: Four times a day (QID) | RECTAL | Status: DC | PRN

## 2021-08-30 MED ORDER — OXYCODONE HCL 5 MG PO TABS
5.0000 mg | ORAL_TABLET | ORAL | Status: DC | PRN
  Administered 2021-09-01 – 2021-09-04 (×3): 5 mg via ORAL
  Filled 2021-08-30 (×3): qty 1

## 2021-08-30 MED ORDER — PANTOPRAZOLE SODIUM 40 MG PO TBEC
40.0000 mg | DELAYED_RELEASE_TABLET | Freq: Every day | ORAL | Status: DC
  Administered 2021-08-31 – 2021-09-04 (×5): 40 mg via ORAL
  Filled 2021-08-30 (×5): qty 1

## 2021-08-30 NOTE — H&P (Signed)
History and Physical    Omar Travis ELF:[id number] DOB: 1965-10-04 DOA: 08/30/2021  PCP: Billie Ruddy, MD   Patient coming from: Home  I have personally briefly reviewed patient's old medical records in [place]  CC: liver abscess HPI: 55 year old male with a history of type 2 diabetes, hypertension, status post laparoscopic cholecystectomy with recurrent choledocholithiasis presents as a direct admission from the ID office.  Patient had a MRCP yesterday which demonstrated liver abscesses.  Also demonstrated a 1.2 cm intraductal stone.  Patient had an ERCP done last week due to thoughts of retained common bile duct stone.  Patient developed another stone.  Patient been having fever chills for the last several weeks.  Is been on antibiotics for several weeks now.  Currently not having any right upper quadrant pain but does have some fever and chills.  Denies any nausea vomiting or diarrhea.  Patient's wife at the bedside.   ED Course: admitted directly  Review of Systems:  Review of Systems  Constitutional:  Positive for chills and fever.  HENT: Negative.    Eyes: Negative.   Respiratory: Negative.    Cardiovascular: Negative.   Genitourinary: Negative.   Musculoskeletal: Negative.   Skin: Negative.   Neurological: Negative.   Endo/Heme/Allergies: Negative.   Psychiatric/Behavioral: Negative.    All other systems reviewed and are negative.  Past Medical History:  Diagnosis Date   Diabetes ([place])    GERD (gastroesophageal reflux disease)    HTN (hypertension)    Hyperlipidemia    Liver abscess 08/30/2021    Past Surgical History:  Procedure Laterality Date   CHOLECYSTECTOMY N/A 07/17/2021   Procedure: LAPAROSCOPIC CHOLECYSTECTOMY;  Surgeon: Clovis Riley, MD;  Location: [place];  Service: General;  Laterality: N/A;   ENDOSCOPIC RETROGRADE CHOLANGIOPANCREATOGRAPHY (ERCP) WITH PROPOFOL N/A 07/16/2021   Procedure: ENDOSCOPIC RETROGRADE  CHOLANGIOPANCREATOGRAPHY (ERCP) WITH PROPOFOL;  Surgeon: Clarene Essex, MD;  Location: [place];  Service: Endoscopy;  Laterality: N/A;   ERCP N/A 08/22/2021   Procedure: ENDOSCOPIC RETROGRADE CHOLANGIOPANCREATOGRAPHY (ERCP);  Surgeon: Clarene Essex, MD;  Location: [facility];  Service: Endoscopy;  Laterality: N/A;   PANCREATIC STENT PLACEMENT  07/16/2021   Procedure: PANCREATIC STENT PLACEMENT;  Surgeon: Clarene Essex, MD;  Location: [place];  Service: Endoscopy;;   REMOVAL OF STONES  07/16/2021   Procedure: REMOVAL OF STONES;  Surgeon: Clarene Essex, MD;  Location: [facility] ENDOSCOPY;  Service: Endoscopy;;   REMOVAL OF STONES  08/22/2021   Procedure: REMOVAL OF STONES;  Surgeon: Clarene Essex, MD;  Location: [facility]COPY;  Service: Endoscopy;;   SPHINCTEROTOMY  07/16/2021   Procedure: Joan Mayans;  Surgeon: Clarene Essex, MD;  Location: [facility] ENDOSCOPY;  Service: Endoscopy;;   SPHINCTEROTOMY  08/22/2021   Procedure: Joan Mayans;  Surgeon: Clarene Essex, MD;  Location: [facility] ENDOSCOPY;  Service: Endoscopy;;     reports that he has never smoked. He has never used smokeless tobacco. He reports current alcohol use. He reports that he does not use drugs.  Allergies  Allergen Reactions   Benadryl Allergy [Diphenhydramine Hcl] Rash   Flexeril [Cyclobenzaprine] Rash    Family History  Problem Relation Age of Onset   Diabetes Mother    Liver cancer Father    Diabetes Father     Prior to Admission medications   Medication Sig Start Date End Date Taking? Authorizing Provider  canagliflozin (INVOKANA) 300 MG TABS tablet Take 300 mg by mouth daily before breakfast.    [provider]  cefdinir (OMNICEF) 300 MG capsule Take  1 capsule (300 mg total) by mouth 2 (two) times daily for 14 days. 08/26/21 09/09/21  Truman Hayward, MD  escitalopram (LEXAPRO) 10 MG tablet Take 1 tablet (10 mg total) by mouth daily. 08/05/21   Billie Ruddy, MD  fenofibrate (TRICOR) 145 MG tablet Take 145 mg by  mouth daily.    [provider]  ibuprofen (ADVIL) 200 MG tablet Take 400-600 mg by mouth every 6 (six) hours as needed (pain/fever).    [provider]  insulin glargine, 1 Unit Dial, (TOUJEO SOLOSTAR) 300 UNIT/ML Solostar Pen Inject 20 Units into the skin daily. Patient taking differently: Inject 30 Units into the skin at bedtime. 07/30/21   Dahal, Marlowe Aschoff, MD  lisinopril (ZESTRIL) 5 MG tablet TAKE ONE (1) TABLET EACH DAY Patient taking differently: Take 5 mg by mouth daily. 03/26/21   Billie Ruddy, MD  metroNIDAZOLE (FLAGYL) 500 MG tablet Take 1 tablet (500 mg total) by mouth 2 (two) times daily. 08/26/21   Truman Hayward, MD  omeprazole (PRILOSEC) 20 MG capsule Take 1 capsule (20 mg total) by mouth daily. 06/25/20   Billie Ruddy, MD  ondansetron (ZOFRAN-ODT) 8 MG disintegrating tablet Take 8 mg by mouth every 8 (eight) hours as needed for nausea. 05/08/21   [provider]  oxyCODONE (OXY IR/ROXICODONE) 5 MG immediate release tablet Take 1 tablet (5 mg total) by mouth every 6 (six) hours as needed for moderate pain or severe pain (not relieved by tylenol or ibuprofen.). Patient not taking: Reported on 08/21/2021 07/17/21   Jill Alexanders, PA-C  OZEMPIC, 1 MG/DOSE, 4 MG/3ML SOPN Inject 1 mg into the skin every Monday. 08/03/21   [provider]  rosuvastatin (CRESTOR) 10 MG tablet Take 10 mg by mouth at bedtime. 08/03/21   [provider]    Physical Exam: Vitals:   08/30/21 1942 08/30/21 1947 08/30/21 2141  BP:  110/67 118/78  Pulse:  (!) 117 (!) 110  Resp:  20 18  Temp:  (!) 100.9 F (38.3 C) (!) 101.6 F (38.7 C)  TempSrc:  Oral Oral  SpO2:  96% 99%  Weight: 97.9 kg    Height: 6' (1.829 m)      Physical Exam Vitals and nursing note reviewed.  Constitutional:      General: He is not in acute distress.    Appearance: Normal appearance. He is normal weight. He is not ill-appearing, toxic-appearing or diaphoretic.  HENT:      Head: Normocephalic and atraumatic.     Nose: Nose normal. No rhinorrhea.  Eyes:     General: Scleral icterus present.        Right eye: No discharge.        Left eye: No discharge.  Cardiovascular:     Rate and Rhythm: Normal rate and regular rhythm.     Pulses: Normal pulses.  Pulmonary:     Effort: Pulmonary effort is normal. No respiratory distress.     Breath sounds: Normal breath sounds. No wheezing or rales.  Abdominal:     General: Abdomen is flat. Bowel sounds are normal. There is no distension.     Palpations: Abdomen is soft.     Tenderness: There is no abdominal tenderness. There is no guarding or rebound.  Musculoskeletal:     Right lower leg: No edema.     Left lower leg: No edema.  Skin:    General: Skin is warm and dry.     Capillary Refill:  Capillary refill takes less than 2 seconds.  Neurological:     General: No focal deficit present.     Mental Status: He is alert and oriented to person, place, and time.     Labs on Admission: I have personally reviewed following labs and imaging studies  CBC: Recent Labs  Lab 08/26/21 1155  WBC 8.4  NEUTROABS 6,678  HGB 11.6*  HCT 35.6*  MCV 82.2  PLT 527   Basic Metabolic Panel: Recent Labs  Lab 08/26/21 1155  NA 137  K 4.0  CL 98  CO2 25  GLUCOSE 88  BUN 10  CREATININE 0.57*  CALCIUM 9.0   GFR: Estimated Creatinine Clearance: 129.4 mL/min (A) (by C-G formula based on SCr of 0.57 mg/dL (L)). Liver Function Tests: Recent Labs  Lab 08/26/21 1155  AST 94*  ALT 117*  BILITOT 2.6*  PROT 6.3   Recent Labs  Lab 08/26/21 1155  LIPASE 21  AMYLASE 34   No results for input(s): AMMONIA in the last 168 hours. Coagulation Profile: No results for input(s): INR, PROTIME in the last 168 hours. Cardiac Enzymes: No results for input(s): CKTOTAL, CKMB, CKMBINDEX, TROPONINI in the last 168 hours. BNP (last 3 results) No results for input(s): PROBNP in the last 8760 hours. HbA1C: No results for  input(s): HGBA1C in the last 72 hours. CBG: Recent Labs  Lab 08/30/21 2132  GLUCAP 94   Lipid Profile: No results for input(s): CHOL, HDL, LDLCALC, TRIG, CHOLHDL, LDLDIRECT in the last 72 hours. Thyroid Function Tests: No results for input(s): TSH, T4TOTAL, FREET4, T3FREE, THYROIDAB in the last 72 hours. Anemia Panel: No results for input(s): VITAMINB12, FOLATE, FERRITIN, TIBC, IRON, RETICCTPCT in the last 72 hours. Urine analysis:    Component Value Date/Time   COLORURINE YELLOW 08/19/2021 1855   APPEARANCEUR CLEAR 08/19/2021 1855   LABSPEC 1.031 (H) 08/19/2021 1855   PHURINE 5.0 08/19/2021 1855   GLUCOSEU >=500 (A) 08/19/2021 1855   HGBUR NEGATIVE 08/19/2021 1855   BILIRUBINUR NEGATIVE 08/19/2021 1855   KETONESUR 20 (A) 08/19/2021 1855   PROTEINUR NEGATIVE 08/19/2021 1855   NITRITE NEGATIVE 08/19/2021 1855   LEUKOCYTESUR NEGATIVE 08/19/2021 1855    Radiological Exams on Admission: I have personally reviewed images MR 3D Recon At Scanner  Result Date: 08/30/2021 CLINICAL DATA:  Biliary ductal dilatation. Recent cholecystectomy, and ERCP with sphincterotomy for choledocholithiasis. EXAM: MRI ABDOMEN WITHOUT AND WITH CONTRAST (INCLUDING MRCP) TECHNIQUE: Multiplanar multisequence MR imaging of the abdomen was performed both before and after the administration of intravenous contrast. Heavily T2-weighted images of the biliary and pancreatic ducts were obtained, and three-dimensional MRCP images were rendered by post processing. CONTRAST:  70mL GADAVIST GADOBUTROL 1 MMOL/ML IV SOLN COMPARISON:  07/25/2021 FINDINGS: Lower chest: No acute findings. Hepatobiliary: Hepatic cirrhosis again demonstrated. Two new cystic lesions with peripheral rim enhancement are seen in segment 2 of the left lobe measuring 1.4 cm on image 32/29, and in the caudate lobe measuring 1.4 cm on image 29/29, both suspicious for small hepatic abscesses. Prior cholecystectomy again noted. Chronic atrophy and  intrahepatic biliary ductal dilatation is again seen throughout the medial right hepatic lobe and caudate lobe, however the degree of intrahepatic ductal dilatation has increased. New intrahepatic biliary ductal dilatation is seen throughout the left hepatic lobe. The common bile duct is nondilated, and there is a filling defect at the transition point in the common hepatic duct which measures approximately 12 mm (images 21/11 and image 18/15). This shows no definite contrast enhancement, favoring  an intraductal calculus over an intraductal mass. In addition, other filling defects are seen within central right and left intrahepatic bile ducts, consistent with intraductal calculi. Pancreas: No mass or inflammatory changes. No evidence of pancreatic ductal dilatation. Spleen: Stable mild splenomegaly. No evidence of splenic mass or abscess. Adrenals/Urinary Tract: No masses identified. Bilateral renal parapelvic cysts incidentally noted. No evidence of hydronephrosis. Stomach/Bowel: Visualized portion unremarkable. Vascular/Lymphatic: No pathologically enlarged lymph nodes identified. No acute vascular findings. Other:  None. Musculoskeletal:  No suspicious bone lesions identified. : Increased intrahepatic biliary ductal dilatation throughout the medial right hepatic lobe and caudate lobe, and new intrahepatic ductal dilatation within the left lobe. Common bile duct remains nondilated. 12 mm filling defect at transition point in the common hepatic duct, favoring an intraductal calculus over an intraductal mass. Other intraductal calculi are seen within the central right and left intrahepatic bile ducts. New small cystic lesions with peripheral rim enhancement in the left lobe and caudate lobe, suspicious for hepatic abscesses. Hepatic cirrhosis. Stable mild splenomegaly, consistent with portal venous hypertension. Electronically Signed   By: Marlaine Hind M.D.   On: 08/30/2021 11:25   MR ABDOMEN MRCP W WO  CONTAST  Result Date: 08/30/2021 CLINICAL DATA:  Biliary ductal dilatation. Recent cholecystectomy, and ERCP with sphincterotomy for choledocholithiasis. EXAM: MRI ABDOMEN WITHOUT AND WITH CONTRAST (INCLUDING MRCP) TECHNIQUE: Multiplanar multisequence MR imaging of the abdomen was performed both before and after the administration of intravenous contrast. Heavily T2-weighted images of the biliary and pancreatic ducts were obtained, and three-dimensional MRCP images were rendered by post processing. CONTRAST:  67mL GADAVIST GADOBUTROL 1 MMOL/ML IV SOLN COMPARISON:  07/25/2021 FINDINGS: Lower chest: No acute findings. Hepatobiliary: Hepatic cirrhosis again demonstrated. Two new cystic lesions with peripheral rim enhancement are seen in segment 2 of the left lobe measuring 1.4 cm on image 32/29, and in the caudate lobe measuring 1.4 cm on image 29/29, both suspicious for small hepatic abscesses. Prior cholecystectomy again noted. Chronic atrophy and intrahepatic biliary ductal dilatation is again seen throughout the medial right hepatic lobe and caudate lobe, however the degree of intrahepatic ductal dilatation has increased. New intrahepatic biliary ductal dilatation is seen throughout the left hepatic lobe. The common bile duct is nondilated, and there is a filling defect at the transition point in the common hepatic duct which measures approximately 12 mm (images 21/11 and image 18/15). This shows no definite contrast enhancement, favoring an intraductal calculus over an intraductal mass. In addition, other filling defects are seen within central right and left intrahepatic bile ducts, consistent with intraductal calculi. Pancreas: No mass or inflammatory changes. No evidence of pancreatic ductal dilatation. Spleen: Stable mild splenomegaly. No evidence of splenic mass or abscess. Adrenals/Urinary Tract: No masses identified. Bilateral renal parapelvic cysts incidentally noted. No evidence of hydronephrosis.  Stomach/Bowel: Visualized portion unremarkable. Vascular/Lymphatic: No pathologically enlarged lymph nodes identified. No acute vascular findings. Other:  None. Musculoskeletal:  No suspicious bone lesions identified. : Increased intrahepatic biliary ductal dilatation throughout the medial right hepatic lobe and caudate lobe, and new intrahepatic ductal dilatation within the left lobe. Common bile duct remains nondilated. 12 mm filling defect at transition point in the common hepatic duct, favoring an intraductal calculus over an intraductal mass. Other intraductal calculi are seen within the central right and left intrahepatic bile ducts. New small cystic lesions with peripheral rim enhancement in the left lobe and caudate lobe, suspicious for hepatic abscesses. Hepatic cirrhosis. Stable mild splenomegaly, consistent with portal venous hypertension. Electronically Signed  By: Marlaine Hind M.D.   On: 08/30/2021 11:25    EKG: I have personally reviewed EKG: no EKG  Assessment/Plan Principal Problem:   Liver abscess Active Problems:   Choledocholithiasis   Diabetes mellitus ([place])   Essential hypertension   Gastroesophageal reflux disease   Hyperlipidemia    Liver abscess Admit medical bed. Blood cultures. IV zosyn. May benefit from ID consult.  Choledocholithiasis Appears to be having de novo bile stone formation. MRPC with new intraductal stone despite having ERCP last week. Will need another ERCP. NPO. Hold chemical DVT prophylaxis. IVF. Prn oxycodone for pain.  Diabetes mellitus ([place]) SSI. Hold home DM meds while NPO.  Essential hypertension Stable.  Gastroesophageal reflux disease Stable.  Hyperlipidemia Stable.  DVT prophylaxis: SCDs Code Status: Full Code Family Communication: discussed with pt and wife at bedside  Disposition Plan: return home  Consults called: none  Admission status: Inpatient, Med-Surg   Kristopher Oppenheim, DO [facility] 08/30/2021, 10:08 PM

## 2021-08-30 NOTE — Assessment & Plan Note (Signed)
Stable

## 2021-08-30 NOTE — Telephone Encounter (Addendum)
I spoke with [facility] with bed control for [facility]. Per Hilliard Clark they currently do not have a bed for the patient. I advised him we will be sending the patient home from our office. They will contact the patient when a bed is available. I have provided bed control with a phone number to reach the patient as well.  Patient advised someone will call him form [facility] when a bed is ready. Patient verbalized understanding.  Amylee Lodato T Brooks Sailors

## 2021-08-30 NOTE — Assessment & Plan Note (Signed)
Appears to be having de novo bile stone formation. MRPC with new intraductal stone despite having ERCP last week. Will need another ERCP. NPO. Hold chemical DVT prophylaxis. IVF. Prn oxycodone for pain.

## 2021-08-30 NOTE — Progress Notes (Signed)
Pharmacy Antibiotic Note  Omar Travis is a 55 y.o. male admitted on 08/30/2021 with  intra-abdominal infection .  Pharmacy has been consulted for zosyn dosing.  Of note, patient was on cefdinir and metronidazole outpatient   Plan: Zosyn 3.375g IV q8h (4 hour infusion). Will monitor for acute changes in renal function and adjust as needed F/u cultures results and de-escalate as appropriate   Height: 6' (182.9 cm) Weight: 97.9 kg (215 lb 12.8 oz) IBW/kg (Calculated) : 77.6  Temp (24hrs), Avg:100.1 F (37.8 C), Min:98 F (36.7 C), Max:101.3 F (38.5 C)  Recent Labs  Lab 08/26/21 1155  WBC 8.4  CREATININE 0.57*    Estimated Creatinine Clearance: 129.4 mL/min (A) (by C-G formula based on SCr of 0.57 mg/dL (L)).    Allergies  Allergen Reactions   Benadryl Allergy [Diphenhydramine Hcl] Rash   Flexeril [Cyclobenzaprine] Rash    Antimicrobials this admission: Zosyn 12/9 >>    Dose adjustments this admission:   Microbiology results: 11/29 BCx: IP  Thank you for allowing pharmacy to be a part of this patient's care.  Donnald Garre, PharmD Clinical Pharmacist  Please check AMION for all [place] numbers After 10:00 PM, call [place] [phone]

## 2021-08-30 NOTE — Assessment & Plan Note (Signed)
Admit medical bed. Blood cultures. IV zosyn. May benefit from ID consult.

## 2021-08-30 NOTE — Progress Notes (Signed)
Subjective:  Chief complaint: Continuing to have fevers despite being on antibiotics   patient ID: Omar Travis, male    DOB: 16-Feb-1966, 55 y.o.   MRN: [id number]  HPI  Omar Travis is a 55 year old Caucasian man who underwent laparoscopic cholecystectomy and pancreatic stenting who then developed postoperative fevers.  He had blood cultures that were negative CT and MRCP that were unremarkable at the time.    It seemed reasonable to assume that the source of his fevers was the biliary tree given his recent surgery with cholecystectomy and stenting of the pancreas.  However as mentioned imaging at the time was unremarkable.  We did also however performed extensive laboratory evaluation of the patient in the hospital for both infectious and noninfectious causes of fevers and these were all unremarkable.  He did however have markedly elevated inflammatory markers suggested that he had a significant element Tory process in place.  He has been seen by my partner Dr. Juleen China twice and was seen in the ER where he had labs concerning for choledocholithiasis with cholecystitis.   Patient was seen by Dr. Watt Climes and had repeat ERCP on December 1.  In the interim he was placed on Augmentin which he continued to present date.  On Friday the day after his ERCP during which multiple stones were removed he was feeling much more like himself again but then in the evening on Friday he began having chills epigastric pain and a temperature using a temporal thermometer which was 101.5.   He continued to suffer from subjective and objective fevers on a nearly daily basis despite Augmentin and despite the recent ERCP.  His only other focal signs were some epigastric pain and sometimes generalized abdominal pain.  He was seen by me earlier this week and I obtained labs that showed worsening of obstructive hepatitis type pattern of liver function tests I discussed with Dr. Watt Climes who had recommended MRCP.  I also  change his antibiotics to cefdinir and metronidazole.  He is continued having fevers despite being on these antibiotics.  MRCP today done shows:  Increased intrahepatic biliary ductal dilatation throughout the medial right hepatic lobe caudate lobe and new intrahepatic ductal dilatation with a left lobe with nondilated common bile bile duct.  There is a 12 mm filling defect at the transition point of the common hepatic duct thought to be intraductal calculus though could be an intraductal mass.  In addition he was found to have new cystic lesions consistent with possible abscesses.  He continues to have subjective fevers and had a fever in clinic at the end of his visit with [place].  He has a normal blood pressure does not look toxic he is a little bit tachycardic.  Other than the continued problems with fevers and occasional epigastric pain he does not have many other complaints.  His wife feels like he has become more jaundiced recently.     Past Medical History:  Diagnosis Date   Diabetes ([place])    GERD (gastroesophageal reflux disease)    HTN (hypertension)    Hyperlipidemia     Past Surgical History:  Procedure Laterality Date   CHOLECYSTECTOMY N/A 07/17/2021   Procedure: LAPAROSCOPIC CHOLECYSTECTOMY;  Surgeon: Clovis Riley, MD;  Location: [facility];  Service: General;  Laterality: N/A;   ENDOSCOPIC RETROGRADE CHOLANGIOPANCREATOGRAPHY (ERCP) WITH PROPOFOL N/A 07/16/2021   Procedure: ENDOSCOPIC RETROGRADE CHOLANGIOPANCREATOGRAPHY (ERCP) WITH PROPOFOL;  Surgeon: Clarene Essex, MD;  Location: [facility];  Service: Endoscopy;  Laterality: N/A;  ERCP N/A 08/22/2021   Procedure: ENDOSCOPIC RETROGRADE CHOLANGIOPANCREATOGRAPHY (ERCP);  Surgeon: Clarene Essex, MD;  Location: [facility];  Service: Endoscopy;  Laterality: N/A;   PANCREATIC STENT PLACEMENT  07/16/2021   Procedure: PANCREATIC STENT PLACEMENT;  Surgeon: Clarene Essex, MD;  Location: [facility] ENDOSCOPY;  Service: Endoscopy;;   REMOVAL OF  STONES  07/16/2021   Procedure: REMOVAL OF STONES;  Surgeon: Clarene Essex, MD;  Location: [facility] ENDOSCOPY;  Service: Endoscopy;;   REMOVAL OF STONES  08/22/2021   Procedure: REMOVAL OF STONES;  Surgeon: Clarene Essex, MD;  Location: [facility] ENDOSCOPY;  Service: Endoscopy;;   SPHINCTEROTOMY  07/16/2021   Procedure: Joan Mayans;  Surgeon: Clarene Essex, MD;  Location: [facility] ENDOSCOPY;  Service: Endoscopy;;   SPHINCTEROTOMY  08/22/2021   Procedure: Joan Mayans;  Surgeon: Clarene Essex, MD;  Location: [facility] ENDOSCOPY;  Service: Endoscopy;;    Family History  Problem Relation Age of Onset   Diabetes Mother    Liver cancer Father    Diabetes Father       Social History   Socioeconomic History   Marital status: Married    Spouse name: Not on file   Number of children: Not on file   Years of education: Not on file   Highest education level: Not on file  Occupational History   Not on file  Tobacco Use   Smoking status: Never   Smokeless tobacco: Never  Substance and Sexual Activity   Alcohol use: Yes   Drug use: Never   Sexual activity: Yes  Other Topics Concern   Not on file  Social History Narrative   Not on file   Social Determinants of Health   Financial Resource Strain: Not on file  Food Insecurity: Not on file  Transportation Needs: Not on file  Physical Activity: Not on file  Stress: Not on file  Social Connections: Not on file    Allergies  Allergen Reactions   Benadryl Allergy [Diphenhydramine Hcl] Rash   Flexeril [Cyclobenzaprine] Rash     Current Outpatient Medications:    canagliflozin (INVOKANA) 300 MG TABS tablet, Take 300 mg by mouth daily before breakfast., Disp: , Rfl:    cefdinir (OMNICEF) 300 MG capsule, Take 1 capsule (300 mg total) by mouth 2 (two) times daily for 14 days., Disp: 28 capsule, Rfl: 1   escitalopram (LEXAPRO) 10 MG tablet, Take 1 tablet (10 mg total) by mouth daily., Disp: 30 tablet, Rfl: 4   fenofibrate (TRICOR) 145 MG tablet, Take 145 mg by  mouth daily., Disp: , Rfl:    ibuprofen (ADVIL) 200 MG tablet, Take 400-600 mg by mouth every 6 (six) hours as needed (pain/fever)., Disp: , Rfl:    insulin glargine, 1 Unit Dial, (TOUJEO SOLOSTAR) 300 UNIT/ML Solostar Pen, Inject 20 Units into the skin daily. (Patient taking differently: Inject 30 Units into the skin at bedtime.), Disp: , Rfl:    lisinopril (ZESTRIL) 5 MG tablet, TAKE ONE (1) TABLET EACH DAY (Patient taking differently: Take 5 mg by mouth daily.), Disp: 90 tablet, Rfl: 1   metroNIDAZOLE (FLAGYL) 500 MG tablet, Take 1 tablet (500 mg total) by mouth 2 (two) times daily., Disp: 28 tablet, Rfl: 2   omeprazole (PRILOSEC) 20 MG capsule, Take 1 capsule (20 mg total) by mouth daily., Disp: 90 capsule, Rfl: 3   ondansetron (ZOFRAN-ODT) 8 MG disintegrating tablet, Take 8 mg by mouth every 8 (eight) hours as needed for nausea., Disp: , Rfl:    OZEMPIC, 1 MG/DOSE, 4 MG/3ML SOPN, Inject 1 mg into  the skin every Monday., Disp: , Rfl:    rosuvastatin (CRESTOR) 10 MG tablet, Take 10 mg by mouth at bedtime., Disp: , Rfl:    oxyCODONE (OXY IR/ROXICODONE) 5 MG immediate release tablet, Take 1 tablet (5 mg total) by mouth every 6 (six) hours as needed for moderate pain or severe pain (not relieved by tylenol or ibuprofen.). (Patient not taking: Reported on 08/21/2021), Disp: 15 tablet, Rfl: 0   Review of Systems  Constitutional:  Positive for fever. Negative for chills.  HENT:  Negative for congestion and sore throat.   Eyes:  Negative for photophobia.  Respiratory:  Negative for cough, shortness of breath and wheezing.   Cardiovascular:  Negative for chest pain, palpitations and leg swelling.  Gastrointestinal:  Positive for abdominal pain. Negative for blood in stool, constipation, diarrhea, nausea and vomiting.  Genitourinary:  Negative for dysuria, flank pain and hematuria.  Musculoskeletal:  Negative for back pain and myalgias.  Skin:  Positive for color change. Negative for rash.   Neurological:  Negative for dizziness, weakness and headaches.  Hematological:  Does not bruise/bleed easily.  Psychiatric/Behavioral:  Negative for agitation, confusion, dysphoric mood, hallucinations and suicidal ideas. The patient is not hyperactive.       Objective:   Physical Exam Constitutional:      Appearance: He is well-developed.  HENT:     Head: Normocephalic and atraumatic.  Eyes:     General: Scleral icterus present.        Right eye: No discharge.        Left eye: No discharge.     Extraocular Movements: Extraocular movements intact.     Conjunctiva/sclera: Conjunctivae normal.  Cardiovascular:     Rate and Rhythm: Regular rhythm. Tachycardia present.     Heart sounds: No murmur heard.   No friction rub. No gallop.  Pulmonary:     Effort: Pulmonary effort is normal. No respiratory distress.     Breath sounds: No stridor. No wheezing or rhonchi.  Abdominal:     General: There is no distension.     Palpations: Abdomen is soft.     Tenderness: There is abdominal tenderness.  Musculoskeletal:        General: No tenderness. Normal range of motion.     Cervical back: Normal range of motion and neck supple.  Skin:    General: Skin is warm and dry.     Coloration: Skin is not pale.     Findings: No erythema or rash.  Neurological:     General: No focal deficit present.     Mental Status: He is alert and oriented to person, place, and time.  Psychiatric:        Mood and Affect: Mood normal.        Behavior: Behavior normal.        Thought Content: Thought content normal.        Judgment: Judgment normal.          Assessment & Plan:   Liver abscesses:  I felt the patient really would be best served by being admitted to the hospital for multiple reasons.  1.  We can place him on IV antibiotics  2.  He can be evaluated by GI in case they do want to do an ERCP  3.  Perhaps radiology might be able to aspirate 1 of these abscesses though they are  characterized as being small   I would favor using ertapenem for him give him a very broad-spectrum antibiotic  and 1 that he could leave the hospital along with a once daily infusion.  Multiple intraductal calculi and 1 in the common hepatic duct that radiology thought could be a stone versus a mass:  Clearly needs repeat CMP and CBC c diff. He looks to have worsening jaundice on exam.  He did have ERCP with Dr. Watt Climes but may need another one? Last time Dr. Watt Climes he felt not. I do not know if the area of the "mass" was visualized   Cirrhosis with splenomegaly:  I have not inquired as to origin, would presume most likely due to alcohol vs NASH  I spent 45 minutes with the patient including than 50% of the time in face to face counseling of the patient his wife regarding liver abscesses choledocholithiasis, options for admission to the hospital which I favored versus trying to manage this as an outpatient, personally reviewing MRCP done today showing 2 new liver abscesses and ductal dilatation as described above and stone versus mass being present along with splenomegaly and cirrhosis along with review of medical records in preparation for the visit and during the visit and in coordination of care with Dr. Tamala Julian from [facility] hospitalist who is agreed to admit the patient to med telemetry bed  I will ask my partner on call to check in on him tomorrow.

## 2021-08-30 NOTE — Telephone Encounter (Signed)
I followed up with Hilliard Clark with [facility] bed control and they are still currently working on getting the patient a bed.

## 2021-08-30 NOTE — Subjective & Objective (Signed)
CC: liver abscess HPI: 55 year old male with a history of type 2 diabetes, hypertension, status post laparoscopic cholecystectomy with recurrent choledocholithiasis presents as a direct admission from the ID office.  Patient had a MRCP yesterday which demonstrated liver abscesses.  Also demonstrated a 1.2 cm intraductal stone.  Patient had an ERCP done last week due to thoughts of retained common bile duct stone.  Patient developed another stone.  Patient been having fever chills for the last several weeks.  Is been on antibiotics for several weeks now.  Currently not having any right upper quadrant pain but does have some fever and chills.  Denies any nausea vomiting or diarrhea.  Patient's wife at the bedside.

## 2021-08-30 NOTE — Assessment & Plan Note (Signed)
SSI. Hold home DM meds while NPO.

## 2021-08-31 ENCOUNTER — Encounter (HOSPITAL_COMMUNITY): Payer: Self-pay | Admitting: Internal Medicine

## 2021-08-31 DIAGNOSIS — K7200 Acute and subacute hepatic failure without coma: Secondary | ICD-10-CM

## 2021-08-31 DIAGNOSIS — K7460 Unspecified cirrhosis of liver: Secondary | ICD-10-CM

## 2021-08-31 DIAGNOSIS — K750 Abscess of liver: Secondary | ICD-10-CM | POA: Diagnosis not present

## 2021-08-31 DIAGNOSIS — K8050 Calculus of bile duct without cholangitis or cholecystitis without obstruction: Secondary | ICD-10-CM

## 2021-08-31 DIAGNOSIS — E1169 Type 2 diabetes mellitus with other specified complication: Secondary | ICD-10-CM | POA: Diagnosis not present

## 2021-08-31 DIAGNOSIS — R6520 Severe sepsis without septic shock: Secondary | ICD-10-CM

## 2021-08-31 DIAGNOSIS — R161 Splenomegaly, not elsewhere classified: Secondary | ICD-10-CM

## 2021-08-31 DIAGNOSIS — A419 Sepsis, unspecified organism: Secondary | ICD-10-CM | POA: Diagnosis not present

## 2021-08-31 DIAGNOSIS — Z794 Long term (current) use of insulin: Secondary | ICD-10-CM

## 2021-08-31 LAB — COMPREHENSIVE METABOLIC PANEL
ALT: 101 U/L — ABNORMAL HIGH (ref 0–44)
ALT: 98 U/L — ABNORMAL HIGH (ref 0–44)
AST: 101 U/L — ABNORMAL HIGH (ref 15–41)
AST: 101 U/L — ABNORMAL HIGH (ref 15–41)
Albumin: 2.3 g/dL — ABNORMAL LOW (ref 3.5–5.0)
Albumin: 2.3 g/dL — ABNORMAL LOW (ref 3.5–5.0)
Alkaline Phosphatase: 623 U/L — ABNORMAL HIGH (ref 38–126)
Alkaline Phosphatase: 662 U/L — ABNORMAL HIGH (ref 38–126)
Anion gap: 9 (ref 5–15)
Anion gap: 13 (ref 5–15)
BUN: 7 mg/dL (ref 6–20)
BUN: 5 mg/dL — ABNORMAL LOW (ref 6–20)
CO2: 24 mmol/L (ref 22–32)
CO2: 23 mmol/L (ref 22–32)
Calcium: 8.5 mg/dL — ABNORMAL LOW (ref 8.9–10.3)
Calcium: 8.4 mg/dL — ABNORMAL LOW (ref 8.9–10.3)
Chloride: 100 mmol/L (ref 98–111)
Chloride: 98 mmol/L (ref 98–111)
Creatinine, Ser: 0.82 mg/dL (ref 0.61–1.24)
Creatinine, Ser: 0.76 mg/dL (ref 0.61–1.24)
GFR, Estimated: >60 mL/min (ref >60)
GFR, Estimated: >60 mL/min (ref >60)
Glucose, Bld: 86 mg/dL (ref 70–99)
Glucose, Bld: 48 mg/dL — ABNORMAL LOW (ref 70–99)
Potassium: 3.5 mmol/L (ref 3.5–5.1)
Potassium: 3.7 mmol/L (ref 3.5–5.1)
Sodium: 133 mmol/L — ABNORMAL LOW (ref 135–145)
Sodium: 134 mmol/L — ABNORMAL LOW (ref 135–145)
Total Bilirubin: 6.4 mg/dL — ABNORMAL HIGH (ref 0.3–1.2)
Total Bilirubin: 6.3 mg/dL — ABNORMAL HIGH (ref 0.3–1.2)
Total Protein: 6.3 g/dL — ABNORMAL LOW (ref 6.5–8.1)
Total Protein: 6.2 g/dL — ABNORMAL LOW (ref 6.5–8.1)

## 2021-08-31 LAB — CBC WITH DIFFERENTIAL/PLATELET
Abs Immature Granulocytes: 0.14 10*3/uL — ABNORMAL HIGH (ref 0.00–0.07)
Basophils Absolute: 0 10*3/uL (ref 0.0–0.1)
Basophils Relative: 0 %
Eosinophils Absolute: 0.1 10*3/uL (ref 0.0–0.5)
Eosinophils Relative: 1 %
HCT: 31.5 % — ABNORMAL LOW (ref 39.0–52.0)
Hemoglobin: 10.3 g/dL — ABNORMAL LOW (ref 13.0–17.0)
Immature Granulocytes: 1 %
Lymphocytes Relative: 10 %
Lymphs Abs: 1.1 10*3/uL (ref 0.7–4.0)
MCH: 26.6 pg (ref 26.0–34.0)
MCHC: 32.7 g/dL (ref 30.0–36.0)
MCV: 81.4 fL (ref 80.0–100.0)
Monocytes Absolute: 0.9 10*3/uL (ref 0.1–1.0)
Monocytes Relative: 8 %
Neutro Abs: 8.8 10*3/uL — ABNORMAL HIGH (ref 1.7–7.7)
Neutrophils Relative %: 80 %
Platelets: 296 10*3/uL (ref 150–400)
RBC: 3.87 MIL/uL — ABNORMAL LOW (ref 4.22–5.81)
RDW: 16 % — ABNORMAL HIGH (ref 11.5–15.5)
WBC: 11 10*3/uL — ABNORMAL HIGH (ref 4.0–10.5)
nRBC: 0 % (ref 0.0–0.2)

## 2021-08-31 LAB — SURGICAL PCR SCREEN
MRSA, PCR: NEGATIVE
Staphylococcus aureus: NEGATIVE

## 2021-08-31 LAB — GLUCOSE, CAPILLARY
Glucose-Capillary: 110 mg/dL — ABNORMAL HIGH (ref 70–99)
Glucose-Capillary: 48 mg/dL — ABNORMAL LOW (ref 70–99)
Glucose-Capillary: 43 mg/dL — CL (ref 70–99)
Glucose-Capillary: 67 mg/dL — ABNORMAL LOW (ref 70–99)
Glucose-Capillary: 87 mg/dL (ref 70–99)
Glucose-Capillary: 123 mg/dL — ABNORMAL HIGH (ref 70–99)
Glucose-Capillary: 157 mg/dL — ABNORMAL HIGH (ref 70–99)
Glucose-Capillary: 172 mg/dL — ABNORMAL HIGH (ref 70–99)

## 2021-08-31 MED ORDER — BOOST / RESOURCE BREEZE PO LIQD CUSTOM
1.0000 | Freq: Three times a day (TID) | ORAL | Status: DC
  Administered 2021-08-31 – 2021-09-04 (×10): 1 via ORAL

## 2021-08-31 MED ORDER — SODIUM CHLORIDE 0.9 % IV SOLN
1.0000 g | INTRAVENOUS | Status: DC
  Administered 2021-08-31 – 2021-09-02 (×3): 1000 mg via INTRAVENOUS
  Filled 2021-08-31 (×6): qty 1

## 2021-08-31 MED ORDER — DEXTROSE 50 % IV SOLN
25.0000 g | INTRAVENOUS | Status: AC
  Administered 2021-08-31: 25 g via INTRAVENOUS

## 2021-08-31 MED ORDER — PROSOURCE PLUS PO LIQD
30.0000 mL | Freq: Two times a day (BID) | ORAL | Status: DC
  Administered 2021-08-31 – 2021-09-04 (×6): 30 mL via ORAL
  Filled 2021-08-31 (×7): qty 30

## 2021-08-31 NOTE — Progress Notes (Signed)
Initial Nutrition Assessment  DOCUMENTATION CODES:   Not applicable, suspect malnutrition.  INTERVENTION:   - Boost Breeze po TID, each supplement provides 250 kcal and 9 grams of protein  - ProSource Plus 30 ml po BID, each supplement provides 100 kcal and 15 grams of protein  - RD will monitor for diet advancement and adjust oral nutrition supplement regimen as appropriate  NUTRITION DIAGNOSIS:   Inadequate oral intake related to altered GI function as evidenced by other (clear liquid diet order).  GOAL:   Patient will meet greater than or equal to 90% of their needs  MONITOR:   PO intake, Supplement acceptance, Diet advancement, Labs, Weight trends  REASON FOR ASSESSMENT:   Malnutrition Screening Tool    ASSESSMENT:   56 year old male who was directed admitted from the ID office for liver abscesses. PMH of T2DM, HTN, GERD, HLD, s/p laparoscopic cholecystectomy with recurrent choledocholithiasis, cirrhosis.  Pt currently on a clear liquid diet. No meal completions charted. Pt with multiple hypoglycemic episodes since admission.  Unable to reach pt via phone call to room. Reviewed weight history in chart. Pt with a steady decline in weight over the last 2 months. Overall, pt with a weight loss of 9.7 kg since 07/08/21. This is a 9% weight loss in less than 2 months which is severe and significant for timeframe. Suspect pt with malnutrition but unable to confirm at this time.  Given pt on clear liquid diet, will order clear liquid oral nutrition supplements to aid pt in meeting kcal and protein needs.  Medications reviewed and include: SSI, protonix, IV abx  Labs reviewed: sodium 134, BUN 5, alkaline phosphatase 662, elevated LFTs, WBC 11.0 CBG's: 43-110  NUTRITION - FOCUSED PHYSICAL EXAM:  Unable to complete at this time. RD working remotely.  Diet Order:   Diet Order             Diet clear liquid Room service appropriate? Yes; Fluid consistency: Thin  Diet  effective now                   EDUCATION NEEDS:   Not appropriate for education at this time  Skin:  Skin Assessment: Reviewed RN Assessment (RN notes jaundice)  Last BM:  08/30/21  Height:   Ht Readings from Last 1 Encounters:  08/30/21 6' (1.829 m)    Weight:   Wt Readings from Last 1 Encounters:  08/30/21 97.9 kg    BMI:  Body mass index is 29.27 kg/m.  Estimated Nutritional Needs:   Kcal:  2200-2400  Protein:  110-125 grams  Fluid:  >/= 2.0 L    Gustavus Bryant, MS, RD, LDN Inpatient Clinical Dietitian Please see [facility] for contact information.

## 2021-08-31 NOTE — Progress Notes (Signed)
Hypoglycemic Event  CBG: 43 at 1200  Treatment: 8 oz juice/soda for both low values  Symptoms: None  Follow-up CBG: Time:  1220 CBG Result:67  Follow-up CBG: Time: 1240 CBG Result: 87  Possible Reasons for Event: Inadequate meal intake, pt NPO till 1100 put on clear liquid diet  Comments/MD notified: Dr. Fanny Bien notified. Asked to notify if happens again.    Bernette Redbird Perrin Gens

## 2021-08-31 NOTE — Consult Note (Signed)
[facility]    Date of Admission:  08/30/2021     Reason for Consult: Liver abscess, cholangitis     Referring Physician: Dr Broadus John  Current antibiotics: Zosyn  Previous antibiotics: Augmentin --> Cefdinir/Flagyl PTA    ASSESSMENT:    55 y.o. male admitted with:  Liver abscess and recurrent cholangitis: In the setting of #2. Choledocholithiasis: Recent ERCP and pancreatic stenting (07/16/21), laparoscopic cholecystectomy (07/17/21; pathology showed chronic cholecystitis), and 2nd ERCP (08/22/21) with removal of further stones.  MRCP 08/29/21 again noting possible intraductal calculus favored over an intraductal mass and other intraductal calculi noted within the central right and left intrahepatic ducts. Biliary ductal disease with intrahepatic ductal dilatation: Has been following with GI several years and seen at [facility] in 2019 (Dr Posey Pronto).  Etiology not entirely clear.  ? Stone disease vs PSC possibly. Cirrhosis, splenomegaly. Type 2 DM: Recent A1c is 9.2 in October 2022. Sepsis.  RECOMMENDATIONS:    Ertapenem 1 gm daily Await GI recommendations Follow up blood cultures May ask IR to evaluate for aspiration of liver abscesses, however, they are quite small.  Certainly not big enough for drain placement but cultures could be beneficial with antibiotic guidance from an aspiration if feasible Lab monitoring Glycemic control Check immunity to hepatitis A and B.  If non-immune, he should be vaccinated Should also get pneumococcal vaccination with chronic liver disease Will follow   Principal Problem:   Liver abscess Active Problems:   Diabetes mellitus ([address])   Essential hypertension   Gastroesophageal reflux disease   Hyperlipidemia   Choledocholithiasis   MEDICATIONS:    Scheduled Meds: . escitalopram  10 mg Oral Daily  . insulin aspart  0-5 Units Subcutaneous QHS  . insulin aspart  0-9 Units Subcutaneous TID WC  . pantoprazole  40 mg  Oral Daily  . rosuvastatin  10 mg Oral QHS   Continuous Infusions: . ertapenem     PRN Meds:.acetaminophen **OR** acetaminophen, ondansetron **OR** ondansetron (ZOFRAN) IV, oxyCODONE  HPI:    Omar Travis is a 55 y.o. male who was admitted last night with cholangitis, ongoing fever despite oral antibiotics, and development of new liver abscess.  Patient was previously admitted at [facility] from 10/31-11/8 for fevers of unknown etiology.  This was in the setting of being 1 week postop from a lap chole and pancreatic stenting.  He had fairly extensive work-up during that admission which was unrevealing.  This included CT scan of the abdomen/pelvis, CT chest, and MRCP.  All were relatively unremarkable for explanation of fevers due to surgery.  His blood cultures were negative.  He defervesced on piperacillin tazobactam for empiric coverage and completed a 5-day course prior to discharge.  He was then seen as an outpatient on 11/16 due to ongoing fevers after his hospitalization.  However, at the time of his 11/16 visit his fevers had abated for approximately 5 days and lab work-up showed normal CMP with exception of mildly elevated alk phos, normal WBC.  His nonspecific inflammatory markers remained elevated but given his clinical improvement it was felt appropriate to watch him with close clinical follow-up.  Unfortunately, he then presented to the emergency department on 11/28 due to continued abdominal pain.  He was also noted to be febrile at that time.  Repeat CT of the abdomen/pelvis showed known intrahepatic biliary Dialar Tatian involving the right and caudate lobes with more involvement of the left hepatic lobe which was mildly worse than his prior  CT scan on 10/31.  He had blood cultures obtained in the ER which were negative.  His LFTs were found to be newly elevated with a T bili of 2.0 and noted leukocytosis of 16.3.  He was discharged home with close outpatient follow-up and presented to  our clinic again on 11/30 at which time his LFTs had continued to increase with a AST 75, ALT 109, alk phos 273, and T bili 4.3.  He he was started on Augmentin and underwent ERCP the following day with gastroenterology.  During this procedure he had multiple stones removed and was feeling more like himself again, but he then developed fevers again the following evening with epigastric discomfort.  The fevers persisted until he saw Dr. Tommy Medal on 12/5.  At that time he was transitioned to cefdinir and metronidazole for broader antimicrobial coverage.  He had repeat labs that showed worsening of his cholestatic liver injury.  MRCP was obtained which showed increased intrahepatic biliary ductal dilatation throughout the medial right hepatic lobe and caudate lobe, and new intrahepatic ductal dilatation within the left lobe.  The common bile duct remained nondilated.  There was also a 12 mm filling defect at the transition point in the common hepatic duct favoring an intraductal calculus over an intraductal mass potentially.  Additionally there were new small cystic lesions with peripheral rim enhancement in the left lobe and caudate lobe suspicious for hepatic abscess.  These were measured at 1.4 cm.  He was subsequently admitted overnight due to these findings.  Blood cultures were obtained which are no growth to date.  His LFTs remain elevated with AST 101, ALT 101, alk phos 623, and T bili 6.4.  He has leukocytosis of 10.6 on admission.  He has been started on piperacillin tazobactam overnight which has been broadened to a carbapenem this morning.  He continues to be febrile since admission and reports abdominal pain and jaundice.  No n/v/d currently.     Past Medical History:  Diagnosis Date  . Diabetes ([place])   . GERD (gastroesophageal reflux disease)   . HTN (hypertension)   . Hyperlipidemia   . Liver abscess 08/30/2021    Social History   Tobacco Use  . Smoking status: Never  . Smokeless tobacco:  Never  Substance Use Topics  . Alcohol use: Never    Comment: Social drinker only on occasion  . Drug use: Never    Family History  Problem Relation Age of Onset  . Diabetes Mother   . Liver cancer Father   . Diabetes Father     Allergies  Allergen Reactions  . Bee Venom Hives and Swelling  . Benadryl Allergy [Diphenhydramine Hcl] Rash  . Flexeril [Cyclobenzaprine] Rash    Review of Systems  All other systems reviewed and are negative. Except as noted in HPI.   OBJECTIVE:   Blood pressure 123/76, pulse (!) 106, temperature 98.1 F (36.7 C), temperature source Oral, resp. rate 17, height 6' (1.829 m), weight 97.9 kg, SpO2 98 %. Body mass index is 29.27 kg/m.  Physical Exam Constitutional:      General: He is not in acute distress.    Appearance: Normal appearance.  HENT:     Head: Normocephalic and atraumatic.  Eyes:     Extraocular Movements: Extraocular movements intact.  Pulmonary:     Effort: Pulmonary effort is normal. No respiratory distress.  Abdominal:     General: There is no distension.     Palpations: Abdomen is soft.  Tenderness: There is no abdominal tenderness.  Musculoskeletal:        General: Normal range of motion.     Cervical back: Normal range of motion and neck supple.  Skin:    General: Skin is warm and dry.     Coloration: Skin is jaundiced.  Neurological:     General: No focal deficit present.     Mental Status: He is alert and oriented to person, place, and time.  Psychiatric:        Mood and Affect: Mood normal.        Behavior: Behavior normal.     Lab Results: Lab Results  Component Value Date   WBC 11.0 (H) 08/31/2021   HGB 10.3 (L) 08/31/2021   HCT 31.5 (L) 08/31/2021   MCV 81.4 08/31/2021   PLT 296 08/31/2021    Lab Results  Component Value Date   NA 134 (L) 08/31/2021   K 3.7 08/31/2021   CO2 23 08/31/2021   GLUCOSE 48 (L) 08/31/2021   BUN 5 (L) 08/31/2021   CREATININE 0.76 08/31/2021   CALCIUM 8.4 (L)  08/31/2021   GFRNONAA >60 08/31/2021    Lab Results  Component Value Date   ALT 98 (H) 08/31/2021   AST 101 (H) 08/31/2021   ALKPHOS 662 (H) 08/31/2021   BILITOT 6.3 (H) 08/31/2021       Component Value Date/Time   CRP 159.2 (H) 08/26/2021 1155       Component Value Date/Time   ESRSEDRATE 94 (H) 08/26/2021 1155    I have reviewed the micro and lab results in Epic.  Imaging: MR 3D Recon At Scanner  Result Date: 08/30/2021 CLINICAL DATA:  Biliary ductal dilatation. Recent cholecystectomy, and ERCP with sphincterotomy for choledocholithiasis. EXAM: MRI ABDOMEN WITHOUT AND WITH CONTRAST (INCLUDING MRCP) TECHNIQUE: Multiplanar multisequence MR imaging of the abdomen was performed both before and after the administration of intravenous contrast. Heavily T2-weighted images of the biliary and pancreatic ducts were obtained, and three-dimensional MRCP images were rendered by post processing. CONTRAST:  36mL GADAVIST GADOBUTROL 1 MMOL/ML IV SOLN COMPARISON:  07/25/2021 FINDINGS: Lower chest: No acute findings. Hepatobiliary: Hepatic cirrhosis again demonstrated. Two new cystic lesions with peripheral rim enhancement are seen in segment 2 of the left lobe measuring 1.4 cm on image 32/29, and in the caudate lobe measuring 1.4 cm on image 29/29, both suspicious for small hepatic abscesses. Prior cholecystectomy again noted. Chronic atrophy and intrahepatic biliary ductal dilatation is again seen throughout the medial right hepatic lobe and caudate lobe, however the degree of intrahepatic ductal dilatation has increased. New intrahepatic biliary ductal dilatation is seen throughout the left hepatic lobe. The common bile duct is nondilated, and there is a filling defect at the transition point in the common hepatic duct which measures approximately 12 mm (images 21/11 and image 18/15). This shows no definite contrast enhancement, favoring an intraductal calculus over an intraductal mass. In addition,  other filling defects are seen within central right and left intrahepatic bile ducts, consistent with intraductal calculi. Pancreas: No mass or inflammatory changes. No evidence of pancreatic ductal dilatation. Spleen: Stable mild splenomegaly. No evidence of splenic mass or abscess. Adrenals/Urinary Tract: No masses identified. Bilateral renal parapelvic cysts incidentally noted. No evidence of hydronephrosis. Stomach/Bowel: Visualized portion unremarkable. Vascular/Lymphatic: No pathologically enlarged lymph nodes identified. No acute vascular findings. Other:  None. Musculoskeletal:  No suspicious bone lesions identified. : Increased intrahepatic biliary ductal dilatation throughout the medial right hepatic lobe and caudate lobe, and new  intrahepatic ductal dilatation within the left lobe. Common bile duct remains nondilated. 12 mm filling defect at transition point in the common hepatic duct, favoring an intraductal calculus over an intraductal mass. Other intraductal calculi are seen within the central right and left intrahepatic bile ducts. New small cystic lesions with peripheral rim enhancement in the left lobe and caudate lobe, suspicious for hepatic abscesses. Hepatic cirrhosis. Stable mild splenomegaly, consistent with portal venous hypertension. Electronically Signed   By: Marlaine Hind M.D.   On: 08/30/2021 11:25   MR ABDOMEN MRCP W WO CONTAST  Result Date: 08/30/2021 CLINICAL DATA:  Biliary ductal dilatation. Recent cholecystectomy, and ERCP with sphincterotomy for choledocholithiasis. EXAM: MRI ABDOMEN WITHOUT AND WITH CONTRAST (INCLUDING MRCP) TECHNIQUE: Multiplanar multisequence MR imaging of the abdomen was performed both before and after the administration of intravenous contrast. Heavily T2-weighted images of the biliary and pancreatic ducts were obtained, and three-dimensional MRCP images were rendered by post processing. CONTRAST:  26m GADAVIST GADOBUTROL 1 MMOL/ML IV SOLN COMPARISON:   07/25/2021 FINDINGS: Lower chest: No acute findings. Hepatobiliary: Hepatic cirrhosis again demonstrated. Two new cystic lesions with peripheral rim enhancement are seen in segment 2 of the left lobe measuring 1.4 cm on image 32/29, and in the caudate lobe measuring 1.4 cm on image 29/29, both suspicious for small hepatic abscesses. Prior cholecystectomy again noted. Chronic atrophy and intrahepatic biliary ductal dilatation is again seen throughout the medial right hepatic lobe and caudate lobe, however the degree of intrahepatic ductal dilatation has increased. New intrahepatic biliary ductal dilatation is seen throughout the left hepatic lobe. The common bile duct is nondilated, and there is a filling defect at the transition point in the common hepatic duct which measures approximately 12 mm (images 21/11 and image 18/15). This shows no definite contrast enhancement, favoring an intraductal calculus over an intraductal mass. In addition, other filling defects are seen within central right and left intrahepatic bile ducts, consistent with intraductal calculi. Pancreas: No mass or inflammatory changes. No evidence of pancreatic ductal dilatation. Spleen: Stable mild splenomegaly. No evidence of splenic mass or abscess. Adrenals/Urinary Tract: No masses identified. Bilateral renal parapelvic cysts incidentally noted. No evidence of hydronephrosis. Stomach/Bowel: Visualized portion unremarkable. Vascular/Lymphatic: No pathologically enlarged lymph nodes identified. No acute vascular findings. Other:  None. Musculoskeletal:  No suspicious bone lesions identified. : Increased intrahepatic biliary ductal dilatation throughout the medial right hepatic lobe and caudate lobe, and new intrahepatic ductal dilatation within the left lobe. Common bile duct remains nondilated. 12 mm filling defect at transition point in the common hepatic duct, favoring an intraductal calculus over an intraductal mass. Other intraductal  calculi are seen within the central right and left intrahepatic bile ducts. New small cystic lesions with peripheral rim enhancement in the left lobe and caudate lobe, suspicious for hepatic abscesses. Hepatic cirrhosis. Stable mild splenomegaly, consistent with portal venous hypertension. Electronically Signed   By: JMarlaine HindM.D.   On: 08/30/2021 11:25     Imaging independently reviewed in Epic.  ARaynelle Highlandfor Infectious Disease CNaper[facility] CentreGroup [phone] 08/31/2021, 11:10 AM

## 2021-08-31 NOTE — Plan of Care (Signed)

## 2021-08-31 NOTE — Progress Notes (Signed)
Patient received as a direct admission from home. Patient and wife oriented to room, unit, bathroom, bed, TV, call bell; educated on plan of care - verbalizes understanding. VS obtained, shift/admission assessments completed - see flowsheets. Denies pain. Elevated temperature and heart rate noted triggering yellow mews - Kristopher Oppenheim, DO notified and aware; PRN tylenol given for elevated temp - see MAR. Maintained NPO with ice chips. Patient currently resting in bed, bed in lowest position. Denies needs. Call bell within reach.

## 2021-08-31 NOTE — Consult Note (Signed)
Referring Provider: Dr. Broadus John Primary Care Physician:  Billie Ruddy, MD Primary Gastroenterologist:  Dr. Watt Climes  Reason for Consultation:  Liver abscess; Fevers; Abnormal biliary tree on imaging  HPI: Omar Travis is a 55 y.o. male with a complicated course following an ERCP with sphincterotomy and pancreatic stent placement in October and lap chole the following day. Due to recurrent fevers of unknown origin and chronic intrahepatic biliary dilation a repeat ERCP was done on 08/22/21 with a biliary sphincterotomy and choledocholithiasis removal. Due to recurrent fevers an MRCP on 08/29/21 showed question of intraductal stone in central hepatic ducts and small cystic lesions in the left lobe and caudate lobe that are new and concerning for liver abscesses. Feels ok. Resting in bed. Wife in room.  Past Medical History:  Diagnosis Date   Diabetes ([place])    GERD (gastroesophageal reflux disease)    HTN (hypertension)    Hyperlipidemia    Liver abscess 08/30/2021    Past Surgical History:  Procedure Laterality Date   CHOLECYSTECTOMY N/A 07/17/2021   Procedure: LAPAROSCOPIC CHOLECYSTECTOMY;  Surgeon: Clovis Riley, MD;  Location: [place];  Service: General;  Laterality: N/A;   ENDOSCOPIC RETROGRADE CHOLANGIOPANCREATOGRAPHY (ERCP) WITH PROPOFOL N/A 07/16/2021   Procedure: ENDOSCOPIC RETROGRADE CHOLANGIOPANCREATOGRAPHY (ERCP) WITH PROPOFOL;  Surgeon: Clarene Essex, MD;  Location: [place];  Service: Endoscopy;  Laterality: N/A;   ERCP N/A 08/22/2021   Procedure: ENDOSCOPIC RETROGRADE CHOLANGIOPANCREATOGRAPHY (ERCP);  Surgeon: Clarene Essex, MD;  Location: [facility];  Service: Endoscopy;  Laterality: N/A;   PANCREATIC STENT PLACEMENT  07/16/2021   Procedure: PANCREATIC STENT PLACEMENT;  Surgeon: Clarene Essex, MD;  Location: [place];  Service: Endoscopy;;   REMOVAL OF STONES  07/16/2021   Procedure: REMOVAL OF STONES;  Surgeon: Clarene Essex, MD;  Location: [facility] ENDOSCOPY;  Service:  Endoscopy;;   REMOVAL OF STONES  08/22/2021   Procedure: REMOVAL OF STONES;  Surgeon: Clarene Essex, MD;  Location: [facility] ENDOSCOPY;  Service: Endoscopy;;   SPHINCTEROTOMY  07/16/2021   Procedure: Joan Mayans;  Surgeon: Clarene Essex, MD;  Location: [facility] ENDOSCOPY;  Service: Endoscopy;;   SPHINCTEROTOMY  08/22/2021   Procedure: Joan Mayans;  Surgeon: Clarene Essex, MD;  Location: [facility] ENDOSCOPY;  Service: Endoscopy;;    Prior to Admission medications   Medication Sig Start Date End Date Taking? Authorizing Provider  canagliflozin (INVOKANA) 300 MG TABS tablet Take 300 mg by mouth daily before breakfast.   Yes [provider]  cefdinir (OMNICEF) 300 MG capsule Take 1 capsule (300 mg total) by mouth 2 (two) times daily for 14 days. 08/26/21 09/09/21 Yes Truman Hayward, MD  escitalopram (LEXAPRO) 10 MG tablet Take 1 tablet (10 mg total) by mouth daily. 08/05/21  Yes Billie Ruddy, MD  fenofibrate (TRICOR) 145 MG tablet Take 145 mg by mouth daily.   Yes [provider]  ibuprofen (ADVIL) 200 MG tablet Take 400-600 mg by mouth every 6 (six) hours as needed (pain/fever).   Yes [provider]  insulin glargine, 1 Unit Dial, (TOUJEO SOLOSTAR) 300 UNIT/ML Solostar Pen Inject 20 Units into the skin daily. Patient taking differently: Inject 30 Units into the skin at bedtime. 07/30/21  Yes Dahal, Marlowe Aschoff, MD  lisinopril (ZESTRIL) 5 MG tablet TAKE ONE (1) TABLET EACH DAY Patient taking differently: Take 5 mg by mouth daily. 03/26/21  Yes Billie Ruddy, MD  metroNIDAZOLE (FLAGYL) 500 MG tablet Take 1 tablet (500 mg total) by mouth 2 (two) times daily. 08/26/21  Yes Yantis, [place],  MD  omeprazole (PRILOSEC) 20 MG capsule Take 1 capsule (20 mg total) by mouth daily. 06/25/20  Yes Billie Ruddy, MD  ondansetron (ZOFRAN-ODT) 8 MG disintegrating tablet Take 8 mg by mouth every 8 (eight) hours as needed for nausea. 05/08/21  Yes [provider]  OZEMPIC, 1 MG/DOSE, 4  MG/3ML SOPN Inject 1 mg into the skin every Monday. 08/03/21  Yes [provider]  rosuvastatin (CRESTOR) 10 MG tablet Take 10 mg by mouth at bedtime. 08/03/21  Yes [provider]  BD PEN NEEDLE NANO U/F 32G X 4 MM MISC 1 each by Other route 2 (two) times daily. 08/16/21   [provider]  Continuous Blood Gluc Sensor (DEXCOM G6 SENSOR) MISC Change sensor every 10 days 08/12/21   [provider]  Continuous Blood Gluc Transmit (DEXCOM G6 TRANSMITTER) MISC Use as directed 08/12/21   [provider]  [facility] VERIO test strip 1 each by Other route 2 (two) times daily. 08/03/21   [provider]  oxyCODONE (OXY IR/ROXICODONE) 5 MG immediate release tablet Take 1 tablet (5 mg total) by mouth every 6 (six) hours as needed for moderate pain or severe pain (not relieved by tylenol or ibuprofen.). Patient not taking: Reported on 08/21/2021 07/17/21   Jill Alexanders, PA-C    Scheduled Meds:  (feeding supplement) PROSource Plus  30 mL Oral BID BM   escitalopram  10 mg Oral Daily   feeding supplement  1 Container Oral TID BM   insulin aspart  0-9 Units Subcutaneous TID WC   pantoprazole  40 mg Oral Daily   rosuvastatin  10 mg Oral QHS   Continuous Infusions:  ertapenem 1,000 mg (08/31/21 1149)   PRN Meds:.acetaminophen **OR** acetaminophen, ondansetron **OR** ondansetron (ZOFRAN) IV, oxyCODONE  Allergies as of 08/30/2021 - Review Complete 08/30/2021  Allergen Reaction Noted   Bee venom Hives and Swelling 08/30/2021   Benadryl allergy [diphenhydramine hcl] Rash 08/03/2017   Flexeril [cyclobenzaprine] Rash 01/16/2020    Family History  Problem Relation Age of Onset   Diabetes Mother    Liver cancer Father    Diabetes Father     Social History   Socioeconomic History   Marital status: Married    Spouse name: Not on file   Number of children: Not on file   Years of education: Not on file   Highest education level: Not on file   Occupational History   Not on file  Tobacco Use   Smoking status: Never   Smokeless tobacco: Never  Substance and Sexual Activity   Alcohol use: Never    Comment: Social drinker only on occasion   Drug use: Never   Sexual activity: Yes  Other Topics Concern   Not on file  Social History Narrative   Not on file   Social Determinants of Health   Financial Resource Strain: Not on file  Food Insecurity: Not on file  Transportation Needs: Not on file  Physical Activity: Not on file  Stress: Not on file  Social Connections: Not on file  Intimate Partner Violence: Not on file    Review of Systems: All negative except as stated above in HPI.  Physical Exam: Vital signs: Vitals:   08/31/21 0720 08/31/21 1157  BP: 123/76 117/73  Pulse: (!) 106 (!) 110  Resp: 17 18  Temp: 98.1 F (36.7 C) 99.3 F (37.4 C)  SpO2: 98% 96%   Last BM Date: 08/28/21 General:   Lethargic, thin, no acute distress  Head: normocephalic, atraumatic Eyes: +icteric sclera ENT: oropharynx clear Neck: supple, nontender Lungs:  Clear throughout to auscultation.   No wheezes, crackles, or rhonchi. No acute distress. Heart:  Regular rate and rhythm; no murmurs, clicks, rubs,  or gallops. Abdomen: epigastric tenderness with guarding, otherwise nontender, soft, nondistended, +BS  Rectal:  Deferred Ext: no edema  GI:  Lab Results: Recent Labs    08/30/21 2306 08/31/21 0225  WBC 10.6* 11.0*  HGB 10.3* 10.3*  HCT 31.5* 31.5*  PLT 318 296   BMET Recent Labs    08/30/21 2306 08/31/21 0225  NA 133* 134*  K 3.5 3.7  CL 100 98  CO2 24 23  GLUCOSE 86 48*  BUN 7 5*  CREATININE 0.82 0.76  CALCIUM 8.5* 8.4*   LFT Recent Labs    08/31/21 0225  PROT 6.2*  ALBUMIN 2.3*  AST 101*  ALT 98*  ALKPHOS 662*  BILITOT 6.3*   PT/INR No results for input(s): LABPROT, INR in the last 72 hours.   Studies/Results: No results found.  Impression/Plan: Recurrent cholangitis and question of small  liver abscesses. TB, ALP, AST rising. ALT elevated. CRP rising. Appreciate ID's evaluation and recs and on broad spectrum antibiotics with Ertapenem. Discussed MRCP with Dr. Burt Ek (on call radiology) and cystic lesions are small and doubt they are amenable to aspiration. May need another ERCP but will defer to Dr. Watt Climes on 09/02/21. Follow LFTs. Supportive care. Clear liquid diet. Will follow.    LOS: 1 day   [name]  08/31/2021, 2:27 PM  Questions please call [phone]

## 2021-08-31 NOTE — Plan of Care (Signed)

## 2021-08-31 NOTE — Progress Notes (Signed)
PROGRESS NOTE    KAMARE CASPERS  ZOX:[id number] DOB: Feb 15, 1966 DOA: 08/30/2021 PCP: Billie Ruddy, MD  Brief Narrative: 55/M with history of type 2 diabetes mellitus, hypertension, NASH/cirrhosis underwent ERCP with sphincterotomy, pancreatic stent placement for cholelithiasis on 10/25, followed by laparoscopic cholecystectomy on 10/26. -Following this was hospitalized with FUO, had  extensive work-up which was unrevealing. -Followed by infectious disease, on 12/1 underwent repeat ERCP, sphincterotomy and stone retrieval, following this has continued to have persistent fevers, mild right-sided abdominal discomfort.  Had an MRCP 12/8-which noted intrahepatic ductal dilation, CBD was nondilated, 12 mm filling defect in the common hepatic duct favoring calculus , in addition new small cystic lesions with peripheral rim enhancement in the left lobe and caudate lobe, suspicious for hepatic abscesses.  -He was seen in infectious disease clinic yesterday evening and directly admitted to [facility], febrile to 101 -Labs noted alk phos of 660, bilirubin of 6.3, WBC of 11.0    Assessment & Plan:   Liver abscesses Intrahepatic ductal dilation-recent ERCP with sphincterotomy, stone retrieval 12/1 Dr. Watt Climes -MRCP notes small liver abscesses and filling defect in the common hepatic duct, intrahepatic ductal dilation -Stop Zosyn, start IV ertapenem, follow-up blood cultures -Gastroenterology consult, discussed with Dr. Michail Sermon, unclear if he needs another ERCP -Infectious disease consult -Start clears -CBC, CMP in a.m.  Liver cirrhosis, portal hypertension -Likely secondary to NASH  Type 2 diabetes mellitus -Oral hypoglycemics on hold, sliding scale insulin  Hypertension -Stable, lisinopril on hold  DVT prophylaxis: SCDs Code Status: Full code Family Communication: Wife at bedside Disposition Plan:  Status is: Inpatient  Remains inpatient appropriate because: Severity of  illness  Consultants:  Gastroenterology, infectious disease  Procedures:   Antimicrobials:    Subjective: -Some weakness, denies any nausea or vomiting, denies any significant pain  Objective: Vitals:   08/30/21 2141 08/30/21 2344 08/31/21 0343 08/31/21 0720  BP: 118/78 117/67 115/77 123/76  Pulse: (!) 110 (!) 110 (!) 109 (!) 106  Resp: '18 18 18 17  ' Temp: (!) 101.6 F (38.7 C) (!) 100.4 F (38 C) (!) 100.4 F (38 C) 98.1 F (36.7 C)  TempSrc: Oral Oral Oral Oral  SpO2: 99% 95% 98% 98%  Weight:      Height:        Intake/Output Summary (Last 24 hours) at 08/31/2021 1005 Last data filed at 08/31/2021 0900 Gross per 24 hour  Intake 50 ml  Output 1 ml  Net 49 ml   Filed Weights   08/30/21 1942  Weight: 97.9 kg    Examination:  General exam: Ill-appearing male appears older than stated age, AAOx3 HEENT: Positive icterus CVS: S1-S2, regular rate rhythm Lungs: Clear bilaterally Abdomen: Soft, nontender, bowel sounds present Extremities: No edema Skin: No rash on exposed skin  Psychiatry: Judgement and insight appear normal. Mood & affect appropriate.     Data Reviewed:   CBC: Recent Labs  Lab 08/26/21 1155 08/30/21 2306 08/31/21 0225  WBC 8.4 10.6* 11.0*  NEUTROABS 6,678  --  8.8*  HGB 11.6* 10.3* 10.3*  HCT 35.6* 31.5* 31.5*  MCV 82.2 80.8 81.4  PLT 202 318 811   Basic Metabolic Panel: Recent Labs  Lab 08/26/21 1155 08/30/21 2306 08/31/21 0225  NA 137 133* 134*  K 4.0 3.5 3.7  CL 98 100 98  CO2 '25 24 23  ' GLUCOSE 88 86 48*  BUN 10 7 5*  CREATININE 0.57* 0.82 0.76  CALCIUM 9.0 8.5* 8.4*   GFR: Estimated Creatinine Clearance: 129.4 mL/min (  by C-G formula based on SCr of 0.76 mg/dL). Liver Function Tests: Recent Labs  Lab 08/26/21 1155 08/30/21 2306 08/31/21 0225  AST 94* 101* 101*  ALT 117* 101* 98*  ALKPHOS  --  623* 662*  BILITOT 2.6* 6.4* 6.3*  PROT 6.3 6.3* 6.2*  ALBUMIN  --  2.3* 2.3*   Recent Labs  Lab 08/26/21 1155   LIPASE 21  AMYLASE 34   No results for input(s): AMMONIA in the last 168 hours. Coagulation Profile: No results for input(s): INR, PROTIME in the last 168 hours. Cardiac Enzymes: No results for input(s): CKTOTAL, CKMB, CKMBINDEX, TROPONINI in the last 168 hours. BNP (last 3 results) No results for input(s): PROBNP in the last 8760 hours. HbA1C: No results for input(s): HGBA1C in the last 72 hours. CBG: Recent Labs  Lab 08/30/21 2132 08/31/21 0651 08/31/21 0722  GLUCAP 94 48* 110*   Lipid Profile: No results for input(s): CHOL, HDL, LDLCALC, TRIG, CHOLHDL, LDLDIRECT in the last 72 hours. Thyroid Function Tests: No results for input(s): TSH, T4TOTAL, FREET4, T3FREE, THYROIDAB in the last 72 hours. Anemia Panel: No results for input(s): VITAMINB12, FOLATE, FERRITIN, TIBC, IRON, RETICCTPCT in the last 72 hours. Urine analysis:    Component Value Date/Time   COLORURINE YELLOW 08/19/2021 1855   APPEARANCEUR CLEAR 08/19/2021 1855   LABSPEC 1.031 (H) 08/19/2021 1855   PHURINE 5.0 08/19/2021 1855   GLUCOSEU >=500 (A) 08/19/2021 1855   HGBUR NEGATIVE 08/19/2021 1855   BILIRUBINUR NEGATIVE 08/19/2021 1855   KETONESUR 20 (A) 08/19/2021 1855   PROTEINUR NEGATIVE 08/19/2021 1855   NITRITE NEGATIVE 08/19/2021 1855   LEUKOCYTESUR NEGATIVE 08/19/2021 1855   Sepsis Labs: '@LABRCNTIP' (procalcitonin:4,lacticidven:4)  ) Recent Results (from the past 240 hour(s))  Resp Panel by RT-PCR (Flu A&B, Covid) Nasopharyngeal Swab     Status: None   Collection Time: 08/30/21  9:05 PM   Specimen: Nasopharyngeal Swab; Nasopharyngeal(NP) swabs in vial transport medium  Result Value Ref Range Status   SARS Coronavirus 2 by RT PCR NEGATIVE NEGATIVE Final    Comment: (NOTE) SARS-CoV-2 target nucleic acids are NOT DETECTED.  The SARS-CoV-2 RNA is generally detectable in upper respiratory specimens during the acute phase of infection. The lowest concentration of SARS-CoV-2 viral copies this assay can  detect is 138 copies/mL. A negative result does not preclude SARS-Cov-2 infection and should not be used as the sole basis for treatment or other patient management decisions. A negative result may occur with  improper specimen collection/handling, submission of specimen other than nasopharyngeal swab, presence of viral mutation(s) within the areas targeted by this assay, and inadequate number of viral copies(<138 copies/mL). A negative result must be combined with clinical observations, patient history, and epidemiological information. The expected result is Negative.  Fact Sheet for Patients:  EntrepreneurPulse.com.au  Fact Sheet for Healthcare Providers:  IncredibleEmployment.be  This test is no t yet approved or cleared by the [place] FDA and  has been authorized for detection and/or diagnosis of SARS-CoV-2 by FDA under an Emergency Use Authorization (EUA). This EUA will remain  in effect (meaning this test can be used) for the duration of the COVID-19 declaration under Section 564(b)(1) of the Act, 21 U.S.C.section 360bbb-3(b)(1), unless the authorization is terminated  or revoked sooner.       Influenza A by PCR NEGATIVE NEGATIVE Final   Influenza B by PCR NEGATIVE NEGATIVE Final    Comment: (NOTE) The Xpert Xpress SARS-CoV-2/FLU/RSV plus assay is intended as an aid in the diagnosis of influenza  from Nasopharyngeal swab specimens and should not be used as a sole basis for treatment. Nasal washings and aspirates are unacceptable for Xpert Xpress SARS-CoV-2/FLU/RSV testing.  Fact Sheet for Patients: EntrepreneurPulse.com.au  Fact Sheet for Healthcare Providers: IncredibleEmployment.be  This test is not yet approved or cleared by the [place] FDA and has been authorized for detection and/or diagnosis of SARS-CoV-2 by FDA under an Emergency Use Authorization (EUA). This EUA will remain in  effect (meaning this test can be used) for the duration of the COVID-19 declaration under Section 564(b)(1) of the Act, 21 U.S.C. section 360bbb-3(b)(1), unless the authorization is terminated or revoked.  Performed at [facility], [place] [address] [place], [place] [address]   Culture, blood (routine x 2)     Status: None (Preliminary result)   Collection Time: 08/30/21 11:06 PM   Specimen: BLOOD  Result Value Ref Range Status   Specimen Description BLOOD LEFT ANTECUBITAL  Final   Special Requests   Final    BOTTLES DRAWN AEROBIC AND ANAEROBIC Blood Culture adequate volume   Culture   Final    NO GROWTH < 12 HOURS Performed at [facility], [place] [address], [place], [place] [address]    Report Status PENDING  Incomplete  Culture, blood (routine x 2)     Status: None (Preliminary result)   Collection Time: 08/30/21 11:12 PM   Specimen: BLOOD LEFT HAND  Result Value Ref Range Status   Specimen Description BLOOD LEFT HAND  Final   Special Requests   Final    BOTTLES DRAWN AEROBIC AND ANAEROBIC Blood Culture adequate volume   Culture   Final    NO GROWTH < 12 HOURS Performed at [facility], [place] [address], [place], [place] [address]    Report Status PENDING  Incomplete  Surgical PCR screen     Status: None   Collection Time: 08/31/21  4:49 AM   Specimen: Nasal Mucosa; Nasal Swab  Result Value Ref Range Status   MRSA, PCR NEGATIVE NEGATIVE Final   Staphylococcus aureus NEGATIVE NEGATIVE Final    Comment: (NOTE) The Xpert SA Assay (FDA approved for NASAL specimens in patients 70 years of age and older), is one component of a comprehensive surveillance program. It is not intended to diagnose infection nor to guide or monitor treatment. Performed at [facility], [place] [address] [place], [place] [address]          Radiology Studies: MR 3D Recon At Scanner  Result Date: 08/30/2021 CLINICAL DATA:  Biliary ductal dilatation. Recent cholecystectomy,  and ERCP with sphincterotomy for choledocholithiasis. EXAM: MRI ABDOMEN WITHOUT AND WITH CONTRAST (INCLUDING MRCP) TECHNIQUE: Multiplanar multisequence MR imaging of the abdomen was performed both before and after the administration of intravenous contrast. Heavily T2-weighted images of the biliary and pancreatic ducts were obtained, and three-dimensional MRCP images were rendered by post processing. CONTRAST:  69m GADAVIST GADOBUTROL 1 MMOL/ML IV SOLN COMPARISON:  07/25/2021 FINDINGS: Lower chest: No acute findings. Hepatobiliary: Hepatic cirrhosis again demonstrated. Two new cystic lesions with peripheral rim enhancement are seen in segment 2 of the left lobe measuring 1.4 cm on image 32/29, and in the caudate lobe measuring 1.4 cm on image 29/29, both suspicious for small hepatic abscesses. Prior cholecystectomy again noted. Chronic atrophy and intrahepatic biliary ductal dilatation is again seen throughout the medial right hepatic lobe and caudate lobe, however the degree of intrahepatic ductal dilatation has increased. New intrahepatic biliary ductal dilatation is seen throughout  the left hepatic lobe. The common bile duct is nondilated, and there is a filling defect at the transition point in the common hepatic duct which measures approximately 12 mm (images 21/11 and image 18/15). This shows no definite contrast enhancement, favoring an intraductal calculus over an intraductal mass. In addition, other filling defects are seen within central right and left intrahepatic bile ducts, consistent with intraductal calculi. Pancreas: No mass or inflammatory changes. No evidence of pancreatic ductal dilatation. Spleen: Stable mild splenomegaly. No evidence of splenic mass or abscess. Adrenals/Urinary Tract: No masses identified. Bilateral renal parapelvic cysts incidentally noted. No evidence of hydronephrosis. Stomach/Bowel: Visualized portion unremarkable. Vascular/Lymphatic: No pathologically enlarged lymph nodes  identified. No acute vascular findings. Other:  None. Musculoskeletal:  No suspicious bone lesions identified. : Increased intrahepatic biliary ductal dilatation throughout the medial right hepatic lobe and caudate lobe, and new intrahepatic ductal dilatation within the left lobe. Common bile duct remains nondilated. 12 mm filling defect at transition point in the common hepatic duct, favoring an intraductal calculus over an intraductal mass. Other intraductal calculi are seen within the central right and left intrahepatic bile ducts. New small cystic lesions with peripheral rim enhancement in the left lobe and caudate lobe, suspicious for hepatic abscesses. Hepatic cirrhosis. Stable mild splenomegaly, consistent with portal venous hypertension. Electronically Signed   By: Marlaine Hind M.D.   On: 08/30/2021 11:25   MR ABDOMEN MRCP W WO CONTAST  Result Date: 08/30/2021 CLINICAL DATA:  Biliary ductal dilatation. Recent cholecystectomy, and ERCP with sphincterotomy for choledocholithiasis. EXAM: MRI ABDOMEN WITHOUT AND WITH CONTRAST (INCLUDING MRCP) TECHNIQUE: Multiplanar multisequence MR imaging of the abdomen was performed both before and after the administration of intravenous contrast. Heavily T2-weighted images of the biliary and pancreatic ducts were obtained, and three-dimensional MRCP images were rendered by post processing. CONTRAST:  83m GADAVIST GADOBUTROL 1 MMOL/ML IV SOLN COMPARISON:  07/25/2021 FINDINGS: Lower chest: No acute findings. Hepatobiliary: Hepatic cirrhosis again demonstrated. Two new cystic lesions with peripheral rim enhancement are seen in segment 2 of the left lobe measuring 1.4 cm on image 32/29, and in the caudate lobe measuring 1.4 cm on image 29/29, both suspicious for small hepatic abscesses. Prior cholecystectomy again noted. Chronic atrophy and intrahepatic biliary ductal dilatation is again seen throughout the medial right hepatic lobe and caudate lobe, however the degree of  intrahepatic ductal dilatation has increased. New intrahepatic biliary ductal dilatation is seen throughout the left hepatic lobe. The common bile duct is nondilated, and there is a filling defect at the transition point in the common hepatic duct which measures approximately 12 mm (images 21/11 and image 18/15). This shows no definite contrast enhancement, favoring an intraductal calculus over an intraductal mass. In addition, other filling defects are seen within central right and left intrahepatic bile ducts, consistent with intraductal calculi. Pancreas: No mass or inflammatory changes. No evidence of pancreatic ductal dilatation. Spleen: Stable mild splenomegaly. No evidence of splenic mass or abscess. Adrenals/Urinary Tract: No masses identified. Bilateral renal parapelvic cysts incidentally noted. No evidence of hydronephrosis. Stomach/Bowel: Visualized portion unremarkable. Vascular/Lymphatic: No pathologically enlarged lymph nodes identified. No acute vascular findings. Other:  None. Musculoskeletal:  No suspicious bone lesions identified. : Increased intrahepatic biliary ductal dilatation throughout the medial right hepatic lobe and caudate lobe, and new intrahepatic ductal dilatation within the left lobe. Common bile duct remains nondilated. 12 mm filling defect at transition point in the common hepatic duct, favoring an intraductal calculus over an intraductal mass. Other intraductal calculi are  seen within the central right and left intrahepatic bile ducts. New small cystic lesions with peripheral rim enhancement in the left lobe and caudate lobe, suspicious for hepatic abscesses. Hepatic cirrhosis. Stable mild splenomegaly, consistent with portal venous hypertension. Electronically Signed   By: Marlaine Hind M.D.   On: 08/30/2021 11:25        Scheduled Meds:  escitalopram  10 mg Oral Daily   insulin aspart  0-5 Units Subcutaneous QHS   insulin aspart  0-9 Units Subcutaneous TID WC   lisinopril   5 mg Oral Daily   pantoprazole  40 mg Oral Daily   rosuvastatin  10 mg Oral QHS   Continuous Infusions:  piperacillin-tazobactam (ZOSYN)  IV 3.375 g (08/31/21 0628)     LOS: 1 day    Time spent: 83mn  PDomenic Polite MD [facility]   08/31/2021, 10:05 AM

## 2021-09-01 LAB — COMPREHENSIVE METABOLIC PANEL
ALT: 83 U/L — ABNORMAL HIGH (ref 0–44)
AST: 82 U/L — ABNORMAL HIGH (ref 15–41)
Albumin: 2.1 g/dL — ABNORMAL LOW (ref 3.5–5.0)
Alkaline Phosphatase: 667 U/L — ABNORMAL HIGH (ref 38–126)
Anion gap: 11 (ref 5–15)
BUN: 6 mg/dL (ref 6–20)
CO2: 24 mmol/L (ref 22–32)
Calcium: 8.9 mg/dL (ref 8.9–10.3)
Chloride: 97 mmol/L — ABNORMAL LOW (ref 98–111)
Creatinine, Ser: 0.84 mg/dL (ref 0.61–1.24)
GFR, Estimated: >60 mL/min (ref >60)
Glucose, Bld: 126 mg/dL — ABNORMAL HIGH (ref 70–99)
Potassium: 3.6 mmol/L (ref 3.5–5.1)
Sodium: 132 mmol/L — ABNORMAL LOW (ref 135–145)
Total Bilirubin: 8.4 mg/dL — ABNORMAL HIGH (ref 0.3–1.2)
Total Protein: 6.5 g/dL (ref 6.5–8.1)

## 2021-09-01 LAB — CBC
HCT: 31.7 % — ABNORMAL LOW (ref 39.0–52.0)
Hemoglobin: 10.6 g/dL — ABNORMAL LOW (ref 13.0–17.0)
MCH: 26.5 pg (ref 26.0–34.0)
MCHC: 33.4 g/dL (ref 30.0–36.0)
MCV: 79.3 fL — ABNORMAL LOW (ref 80.0–100.0)
Platelets: 280 10*3/uL (ref 150–400)
RBC: 4 MIL/uL — ABNORMAL LOW (ref 4.22–5.81)
RDW: 16.1 % — ABNORMAL HIGH (ref 11.5–15.5)
WBC: 11.1 10*3/uL — ABNORMAL HIGH (ref 4.0–10.5)
nRBC: 0 % (ref 0.0–0.2)

## 2021-09-01 LAB — HEPATITIS B SURFACE ANTIGEN: Hepatitis B Surface Ag: NONREACTIVE

## 2021-09-01 LAB — GLUCOSE, CAPILLARY
Glucose-Capillary: 118 mg/dL — ABNORMAL HIGH (ref 70–99)
Glucose-Capillary: 87 mg/dL (ref 70–99)
Glucose-Capillary: 213 mg/dL — ABNORMAL HIGH (ref 70–99)
Glucose-Capillary: 148 mg/dL — ABNORMAL HIGH (ref 70–99)
Glucose-Capillary: 131 mg/dL — ABNORMAL HIGH (ref 70–99)

## 2021-09-01 LAB — HEPATITIS B SURFACE ANTIBODY,QUALITATIVE: Hep B S Ab: NONREACTIVE

## 2021-09-01 LAB — HEPATITIS B CORE ANTIBODY, TOTAL: Hep B Core Total Ab: NONREACTIVE

## 2021-09-01 LAB — HEPATITIS A ANTIBODY, TOTAL: hep A Total Ab: NONREACTIVE

## 2021-09-01 MED ORDER — SODIUM CHLORIDE 0.9 % IV SOLN: INTRAVENOUS | Status: DC

## 2021-09-01 MED ORDER — SODIUM CHLORIDE 0.9 % IV SOLN
12.5000 mg | Freq: Four times a day (QID) | INTRAVENOUS | Status: DC | PRN
  Administered 2021-09-01: 12.5 mg via INTRAVENOUS
  Filled 2021-09-01: qty 0.5
  Filled 2021-09-01: qty 12.5
  Filled 2021-09-01: qty 0.5

## 2021-09-01 NOTE — Progress Notes (Signed)
[facility] Gastroenterology Progress Note  [name] 55 y.o. 04-16-1966   Subjective: Denies abd pain/N/V. No BMs. Wife at bedside.  Objective: Vital signs: Vitals:   09/01/21 0517 09/01/21 0745  BP: 110/71 115/72  Pulse: (!) 101 100  Resp: 18 18  Temp: 98.9 F (37.2 C) 98.8 F (37.1 C)  SpO2: 95% 97%  Tm 101  Physical Exam: Gen: alert, no acute distress, thin, pleasant HEENT: +icteric sclera CV: RRR Chest: CTA B Abd: RUQ tenderness with guarding, otherwise nontender, soft, nondistended, +BS Ext: no edema  Lab Results: Recent Labs    08/31/21 0225 09/01/21 0228  NA 134* 132*  K 3.7 3.6  CL 98 97*  CO2 23 24  GLUCOSE 48* 126*  BUN 5* 6  CREATININE 0.76 0.84  CALCIUM 8.4* 8.9   Recent Labs    08/31/21 0225 09/01/21 0228  AST 101* 82*  ALT 98* 83*  ALKPHOS 662* 667*  BILITOT 6.3* 8.4*  PROT 6.2* 6.5  ALBUMIN 2.3* 2.1*   Recent Labs    08/31/21 0225 09/01/21 0228  WBC 11.0* 11.1*  NEUTROABS 8.8*  --   HGB 10.3* 10.6*  HCT 31.5* 31.7*  MCV 81.4 79.3*  PLT 296 280      Assessment/Plan: Cholangitis and question of small liver abscesses. On Ertapenem. Small cystic lesions not amenable to biopsy per radiology. TB, ALP rising. Slight decrease in AST/ALT. Question repeat ERCP tomorrow and Dr. Watt Climes on service tomorrow. NPO p MN in case ERCP is planned.   Omar Travis 09/01/2021, 9:15 AM  Questions please call [phone] Patient ID: Omar Travis, male   DOB: [birth year], 55 y.o.   MRN: [id number]

## 2021-09-01 NOTE — Progress Notes (Signed)
ID follow-up note:  Patient remains febrile with a fever last night at 8 PM of 101 F.  His leukocytosis is stable at 11.1, LFTs slightly improved, alk phos stable, T bili increased up to 8.4.  He was evaluated by gastroenterology yesterday and may require repeat ERCP which will be determined tomorrow when Dr. Watt Climes is back.  Blood cultures are NGTD.   Problems: 1.  Recurrent cholangitis and cystic liver lesions concerning for abscess: In the setting of #2. 2.  Choledocholithiasis: Complicated recent history with ERCP and pancreatic stenting (07/16/2021), laparoscopic cholecystectomy (07/17/2021; pathology showed chronic cholecystitis), and second ERCP (08/22/2021) with removal of further stones at that time.  Due to continued cholangitis symptoms a follow-up MRCP was obtained 08/29/2021 again noting possible intraductal calculus which was favored over an intraductal mass and other intraductal calculi also noted within the central right and left intrahepatic ducts. 3.  Biliary ductal disease with intrahepatic ductal dilatation: He has been following with GI for many years and was previously seen at [facility] in 2019. by Dr. Posey Pronto.  Etiology of his ductal disease is not entirely clear. 4.  Cirrhosis, splenomegaly. 5.  Type 2 diabetes: Recent A1c is 9.2 in October 2022. 6.  Sepsis.  Reccs: -- Continue ertapenem 1 g daily -- Await GI recommendations regarding further ERCP -- Follow-up blood cultures which are no growth to date -- Lab monitoring -- GI discussed cystic lesions with radiology yesterday and given the size they are unlikely to be amenable to aspiration -- Follow-up hepatitis serologies -- Will follow   Omar Travis for Infectious Disease [facility] 09/01/2021, 8:35 AM

## 2021-09-01 NOTE — Plan of Care (Signed)
  Problem: Education: Goal: Knowledge of General Education information will improve Description: Including pain rating scale, medication(s)/side effects and non-pharmacologic comfort measures Outcome: Progressing   Problem: Coping: Goal: Level of anxiety will decrease Outcome: Progressing   Problem: Pain Managment: Goal: General experience of comfort will improve Outcome: Progressing   

## 2021-09-01 NOTE — Anesthesia Preprocedure Evaluation (Addendum)
Anesthesia Evaluation  Patient identified by MRN, date of birth, ID band Patient awake    Reviewed: Allergy & Precautions, NPO status , Patient's Chart, lab work & pertinent test results  Airway Mallampati: II  TM Distance: >3 FB Neck ROM: Full    Dental no notable dental hx. (+) Teeth Intact, Dental Advisory Given,    Pulmonary neg pulmonary ROS,    Pulmonary exam normal breath sounds clear to auscultation       Cardiovascular hypertension, Pt. on medications  Rhythm:Regular Rate:Tachycardia     Neuro/Psych PSYCHIATRIC DISORDERS Depression negative neurological ROS     GI/Hepatic Neg liver ROS, GERD  Medicated and Controlled,  Endo/Other  diabetes, Poorly Controlled, Type 2, Insulin Dependenta1c 9.2  Renal/GU negative Renal ROS  negative genitourinary   Musculoskeletal negative musculoskeletal ROS (+)   Abdominal   Peds  Hematology negative hematology ROS (+)   Anesthesia Other Findings   Reproductive/Obstetrics negative OB ROS                           Anesthesia Physical Anesthesia Plan  ASA: 3  Anesthesia Plan: General   Post-op Pain Management:    Induction: Intravenous  PONV Risk Score and Plan: 2 and Ondansetron, Dexamethasone, Midazolam and Treatment may vary due to age or medical condition  Airway Management Planned: Oral ETT  Additional Equipment: None  Intra-op Plan:   Post-operative Plan: Extubation in OR  Informed Consent: I have reviewed the patients History and Physical, chart, labs and discussed the procedure including the risks, benefits and alternatives for the proposed anesthesia with the patient or authorized representative who has indicated his/her understanding and acceptance.     Dental advisory given  Plan Discussed with: CRNA  Anesthesia Plan Comments: (Grade 1 view w/ miller 2 blade)       Anesthesia Quick Evaluation

## 2021-09-01 NOTE — Progress Notes (Signed)
Fever PROGRESS NOTE    Omar Travis  ONG:[id number] DOB: 1966/03/09 DOA: 08/30/2021 PCP: Billie Ruddy, MD  Brief Narrative: 55/M with history of type 2 diabetes mellitus, hypertension, NASH/cirrhosis underwent ERCP with sphincterotomy, pancreatic stent placement for cholelithiasis on 10/25, followed by laparoscopic cholecystectomy on 10/26. -Following this was hospitalized with FUO, had  extensive work-up which was unrevealing. -Followed by infectious disease, on 12/1 underwent repeat ERCP, sphincterotomy and stone retrieval, following this has continued to have persistent fevers, mild right-sided abdominal discomfort.  Had an MRCP 12/8-which noted intrahepatic ductal dilation, CBD was nondilated, 12 mm filling defect in the common hepatic duct favoring calculus , in addition new small cystic lesions with peripheral rim enhancement in the left lobe and caudate lobe, suspicious for hepatic abscesses.  -He was seen in infectious disease clinic yesterday evening and directly admitted to [facility], febrile to 101 -Labs noted alk phos of 660, bilirubin of 6.3, WBC of 11.0    Assessment & Plan:   Liver abscesses Intrahepatic ductal dilation-?  Cholangitis -recent ERCP with sphincterotomy, stone retrieval 12/1 Dr. Watt Climes -MRCP notes small liver abscesses and filling defect in the common hepatic duct, intrahepatic ductal dilation -Day 2- IV ertapenem, blood cultures are negative, fever curve improving -Gastroenterology following, Dr. Watt Climes to follow-up tomorrow and decide regarding ERCP -appreciate infectious disease input -Liver abscesses are too small, not amenable to IR drainage -Advance to low-fat diet -CMP in a.m.  Liver cirrhosis, portal hypertension -Likely secondary to NASH  Type 2 diabetes mellitus -Oral hypoglycemics on hold, sliding scale insulin  Hypertension -Stable, lisinopril on hold  DVT prophylaxis: SCDs Code Status: Full code Family Communication: Wife at  bedside Disposition Plan:  Status is: Inpatient  Remains inpatient appropriate because: Severity of illness  Consultants:  Gastroenterology, infectious disease  Procedures:   Antimicrobials:    Subjective: -Feels okay, denies any pain today, no nausea or vomiting,  Objective: Vitals:   08/31/21 1500 08/31/21 2009 09/01/21 0517 09/01/21 0745  BP: 116/76 125/78 110/71 115/72  Pulse: (!) 109 99 (!) 101 100  Resp: '18 18 18 18  ' Temp: 99.9 F (37.7 C) (!) 101 F (38.3 C) 98.9 F (37.2 C) 98.8 F (37.1 C)  TempSrc: Oral Oral Oral Oral  SpO2: 98% 96% 95% 97%  Weight:   95.8 kg   Height:        Intake/Output Summary (Last 24 hours) at 09/01/2021 1043 Last data filed at 09/01/2021 0800 Gross per 24 hour  Intake 840 ml  Output --  Net 840 ml   Filed Weights   08/30/21 1942 09/01/21 0517  Weight: 97.9 kg 95.8 kg    Examination:  Pleasant male sitting up in bed, AAOx3, no distress,  HEENT: Positive icterus CVS: S1-S2, regular rate rhythm Lungs: Clear bilaterally Abdomen: Soft, nontender, bowel sounds present Extremities: No edema  Skin: Icteric skin  psychiatry: Judgement and insight appear normal. Mood & affect appropriate.     Data Reviewed:   CBC: Recent Labs  Lab 08/26/21 1155 08/30/21 2306 08/31/21 0225 09/01/21 0228  WBC 8.4 10.6* 11.0* 11.1*  NEUTROABS 6,678  --  8.8*  --   HGB 11.6* 10.3* 10.3* 10.6*  HCT 35.6* 31.5* 31.5* 31.7*  MCV 82.2 80.8 81.4 79.3*  PLT 202 318 296 440   Basic Metabolic Panel: Recent Labs  Lab 08/26/21 1155 08/30/21 2306 08/31/21 0225 09/01/21 0228  NA 137 133* 134* 132*  K 4.0 3.5 3.7 3.6  CL 98 100 98 97*  CO2 25  '24 23 24  ' GLUCOSE 88 86 48* 126*  BUN 10 7 5* 6  CREATININE 0.57* 0.82 0.76 0.84  CALCIUM 9.0 8.5* 8.4* 8.9   GFR: Estimated Creatinine Clearance: 122.1 mL/min (by C-G formula based on SCr of 0.84 mg/dL). Liver Function Tests: Recent Labs  Lab 08/26/21 1155 08/30/21 2306 08/31/21 0225  09/01/21 0228  AST 94* 101* 101* 82*  ALT 117* 101* 98* 83*  ALKPHOS  --  623* 662* 667*  BILITOT 2.6* 6.4* 6.3* 8.4*  PROT 6.3 6.3* 6.2* 6.5  ALBUMIN  --  2.3* 2.3* 2.1*   Recent Labs  Lab 08/26/21 1155  LIPASE 21  AMYLASE 34   No results for input(s): AMMONIA in the last 168 hours. Coagulation Profile: No results for input(s): INR, PROTIME in the last 168 hours. Cardiac Enzymes: No results for input(s): CKTOTAL, CKMB, CKMBINDEX, TROPONINI in the last 168 hours. BNP (last 3 results) No results for input(s): PROBNP in the last 8760 hours. HbA1C: No results for input(s): HGBA1C in the last 72 hours. CBG: Recent Labs  Lab 08/31/21 1535 08/31/21 1718 08/31/21 2150 09/01/21 0140 09/01/21 0748  GLUCAP 123* 157* 172* 118* 87   Lipid Profile: No results for input(s): CHOL, HDL, LDLCALC, TRIG, CHOLHDL, LDLDIRECT in the last 72 hours. Thyroid Function Tests: No results for input(s): TSH, T4TOTAL, FREET4, T3FREE, THYROIDAB in the last 72 hours. Anemia Panel: No results for input(s): VITAMINB12, FOLATE, FERRITIN, TIBC, IRON, RETICCTPCT in the last 72 hours. Urine analysis:    Component Value Date/Time   COLORURINE YELLOW 08/19/2021 1855   APPEARANCEUR CLEAR 08/19/2021 1855   LABSPEC 1.031 (H) 08/19/2021 1855   PHURINE 5.0 08/19/2021 1855   GLUCOSEU >=500 (A) 08/19/2021 1855   HGBUR NEGATIVE 08/19/2021 1855   BILIRUBINUR NEGATIVE 08/19/2021 1855   KETONESUR 20 (A) 08/19/2021 1855   PROTEINUR NEGATIVE 08/19/2021 1855   NITRITE NEGATIVE 08/19/2021 1855   LEUKOCYTESUR NEGATIVE 08/19/2021 1855   Sepsis Labs: '@LABRCNTIP' (procalcitonin:4,lacticidven:4)  ) Recent Results (from the past 240 hour(s))  Resp Panel by RT-PCR (Flu A&B, Covid) Nasopharyngeal Swab     Status: None   Collection Time: 08/30/21  9:05 PM   Specimen: Nasopharyngeal Swab; Nasopharyngeal(NP) swabs in vial transport medium  Result Value Ref Range Status   SARS Coronavirus 2 by RT PCR NEGATIVE NEGATIVE  Final    Comment: (NOTE) SARS-CoV-2 target nucleic acids are NOT DETECTED.  The SARS-CoV-2 RNA is generally detectable in upper respiratory specimens during the acute phase of infection. The lowest concentration of SARS-CoV-2 viral copies this assay can detect is 138 copies/mL. A negative result does not preclude SARS-Cov-2 infection and should not be used as the sole basis for treatment or other patient management decisions. A negative result may occur with  improper specimen collection/handling, submission of specimen other than nasopharyngeal swab, presence of viral mutation(s) within the areas targeted by this assay, and inadequate number of viral copies(<138 copies/mL). A negative result must be combined with clinical observations, patient history, and epidemiological information. The expected result is Negative.  Fact Sheet for Patients:  EntrepreneurPulse.com.au  Fact Sheet for Healthcare Providers:  IncredibleEmployment.be  This test is no t yet approved or cleared by the [place] FDA and  has been authorized for detection and/or diagnosis of SARS-CoV-2 by FDA under an Emergency Use Authorization (EUA). This EUA will remain  in effect (meaning this test can be used) for the duration of the COVID-19 declaration under Section 564(b)(1) of the Act, 21 U.S.C.section 360bbb-3(b)(1), unless the authorization  is terminated  or revoked sooner.       Influenza A by PCR NEGATIVE NEGATIVE Final   Influenza B by PCR NEGATIVE NEGATIVE Final    Comment: (NOTE) The Xpert Xpress SARS-CoV-2/FLU/RSV plus assay is intended as an aid in the diagnosis of influenza from Nasopharyngeal swab specimens and should not be used as a sole basis for treatment. Nasal washings and aspirates are unacceptable for Xpert Xpress SARS-CoV-2/FLU/RSV testing.  Fact Sheet for Patients: EntrepreneurPulse.com.au  Fact Sheet for Healthcare  Providers: IncredibleEmployment.be  This test is not yet approved or cleared by the [place] FDA and has been authorized for detection and/or diagnosis of SARS-CoV-2 by FDA under an Emergency Use Authorization (EUA). This EUA will remain in effect (meaning this test can be used) for the duration of the COVID-19 declaration under Section 564(b)(1) of the Act, 21 U.S.C. section 360bbb-3(b)(1), unless the authorization is terminated or revoked.  Performed at [facility], [place] [address] [place], [place] [address]   Culture, blood (routine x 2)     Status: None (Preliminary result)   Collection Time: 08/30/21 11:06 PM   Specimen: BLOOD  Result Value Ref Range Status   Specimen Description BLOOD LEFT ANTECUBITAL  Final   Special Requests   Final    BOTTLES DRAWN AEROBIC AND ANAEROBIC Blood Culture adequate volume   Culture   Final    NO GROWTH 2 DAYS Performed at [facility], [place] [address] [place], [place] [address]    Report Status PENDING  Incomplete  Culture, blood (routine x 2)     Status: None (Preliminary result)   Collection Time: 08/30/21 11:12 PM   Specimen: BLOOD LEFT HAND  Result Value Ref Range Status   Specimen Description BLOOD LEFT HAND  Final   Special Requests   Final    BOTTLES DRAWN AEROBIC AND ANAEROBIC Blood Culture adequate volume   Culture   Final    NO GROWTH 2 DAYS Performed at [facility], [place] [address] [place], [place] [address]    Report Status PENDING  Incomplete  Surgical PCR screen     Status: None   Collection Time: 08/31/21  4:49 AM   Specimen: Nasal Mucosa; Nasal Swab  Result Value Ref Range Status   MRSA, PCR NEGATIVE NEGATIVE Final   Staphylococcus aureus NEGATIVE NEGATIVE Final    Comment: (NOTE) The Xpert SA Assay (FDA approved for NASAL specimens in patients 55 years of age and older), is one component of a comprehensive surveillance program. It is not intended to diagnose infection nor  to guide or monitor treatment. Performed at [facility], [place] [address], [place], [place] [address]      Scheduled Meds:  (feeding supplement) PROSource Plus  30 mL Oral BID BM   escitalopram  10 mg Oral Daily   feeding supplement  1 Container Oral TID BM   insulin aspart  0-9 Units Subcutaneous TID WC   pantoprazole  40 mg Oral Daily   rosuvastatin  10 mg Oral QHS   Continuous Infusions:  ertapenem 1,000 mg (08/31/21 1149)     LOS: 2 days    Time spent: 73mn  PDomenic Polite MD [facility]   09/01/2021, 10:43 AM

## 2021-09-02 ENCOUNTER — Inpatient Hospital Stay (HOSPITAL_COMMUNITY): Payer: BC Managed Care – PPO

## 2021-09-02 ENCOUNTER — Encounter (HOSPITAL_COMMUNITY)
Admission: AD | Disposition: A | Discharge status: Home / Self Care | Payer: Self-pay | Source: Ambulatory Visit | Attending: Internal Medicine

## 2021-09-02 ENCOUNTER — Encounter (HOSPITAL_COMMUNITY): Payer: Self-pay | Admitting: Internal Medicine

## 2021-09-02 ENCOUNTER — Inpatient Hospital Stay (HOSPITAL_COMMUNITY): Payer: BC Managed Care – PPO | Admitting: Anesthesiology

## 2021-09-02 DIAGNOSIS — K750 Abscess of liver: Secondary | ICD-10-CM | POA: Diagnosis not present

## 2021-09-02 DIAGNOSIS — E1169 Type 2 diabetes mellitus with other specified complication: Secondary | ICD-10-CM | POA: Diagnosis not present

## 2021-09-02 DIAGNOSIS — K8309 Other cholangitis: Secondary | ICD-10-CM | POA: Diagnosis not present

## 2021-09-02 DIAGNOSIS — K8050 Calculus of bile duct without cholangitis or cholecystitis without obstruction: Secondary | ICD-10-CM | POA: Diagnosis not present

## 2021-09-02 DIAGNOSIS — R7989 Other specified abnormal findings of blood chemistry: Secondary | ICD-10-CM

## 2021-09-02 HISTORY — PX: ERCP: SHX5425

## 2021-09-02 HISTORY — PX: SPYGLASS CHOLANGIOSCOPY: SHX5441

## 2021-09-02 HISTORY — PX: BIOPSY: SHX5522

## 2021-09-02 HISTORY — PX: SPYGLASS LITHOTRIPSY: SHX5537

## 2021-09-02 HISTORY — PX: BILIARY BRUSHING: SHX6843

## 2021-09-02 HISTORY — PX: BILIARY STENT PLACEMENT: SHX5538

## 2021-09-02 HISTORY — PX: REMOVAL OF STONES: SHX5545

## 2021-09-02 LAB — COMPREHENSIVE METABOLIC PANEL
ALT: 82 U/L — ABNORMAL HIGH (ref 0–44)
AST: 99 U/L — ABNORMAL HIGH (ref 15–41)
Albumin: 2 g/dL — ABNORMAL LOW (ref 3.5–5.0)
Alkaline Phosphatase: 705 U/L — ABNORMAL HIGH (ref 38–126)
Anion gap: 12 (ref 5–15)
BUN: 11 mg/dL (ref 6–20)
CO2: 24 mmol/L (ref 22–32)
Calcium: 8.8 mg/dL — ABNORMAL LOW (ref 8.9–10.3)
Chloride: 94 mmol/L — ABNORMAL LOW (ref 98–111)
Creatinine, Ser: 0.97 mg/dL (ref 0.61–1.24)
GFR, Estimated: >60 mL/min (ref >60)
Glucose, Bld: 119 mg/dL — ABNORMAL HIGH (ref 70–99)
Potassium: 4.3 mmol/L (ref 3.5–5.1)
Sodium: 130 mmol/L — ABNORMAL LOW (ref 135–145)
Total Bilirubin: 7.4 mg/dL — ABNORMAL HIGH (ref 0.3–1.2)
Total Protein: 6.6 g/dL (ref 6.5–8.1)

## 2021-09-02 LAB — CBC
HCT: 32.5 % — ABNORMAL LOW (ref 39.0–52.0)
Hemoglobin: 10.5 g/dL — ABNORMAL LOW (ref 13.0–17.0)
MCH: 25.9 pg — ABNORMAL LOW (ref 26.0–34.0)
MCHC: 32.3 g/dL (ref 30.0–36.0)
MCV: 80.2 fL (ref 80.0–100.0)
Platelets: 368 10*3/uL (ref 150–400)
RBC: 4.05 MIL/uL — ABNORMAL LOW (ref 4.22–5.81)
RDW: 16.4 % — ABNORMAL HIGH (ref 11.5–15.5)
WBC: 12.4 10*3/uL — ABNORMAL HIGH (ref 4.0–10.5)
nRBC: 0 % (ref 0.0–0.2)

## 2021-09-02 LAB — GLUCOSE, CAPILLARY
Glucose-Capillary: 128 mg/dL — ABNORMAL HIGH (ref 70–99)
Glucose-Capillary: 134 mg/dL — ABNORMAL HIGH (ref 70–99)
Glucose-Capillary: 174 mg/dL — ABNORMAL HIGH (ref 70–99)
Glucose-Capillary: 215 mg/dL — ABNORMAL HIGH (ref 70–99)

## 2021-09-02 SURGERY — ERCP, WITH INTERVENTION IF INDICATED
Anesthesia: General

## 2021-09-02 MED ORDER — SODIUM CHLORIDE 0.9 % IV SOLN
INTRAVENOUS | Status: DC | PRN
  Administered 2021-09-02: 75 mL

## 2021-09-02 MED ORDER — PROPOFOL 10 MG/ML IV BOLUS
INTRAVENOUS | Status: DC | PRN
  Administered 2021-09-02: 200 mg via INTRAVENOUS

## 2021-09-02 MED ORDER — FENTANYL CITRATE (PF) 250 MCG/5ML IJ SOLN
INTRAMUSCULAR | Status: DC | PRN
  Administered 2021-09-02: 100 ug via INTRAVENOUS

## 2021-09-02 MED ORDER — ONDANSETRON HCL 4 MG/2ML IJ SOLN
INTRAMUSCULAR | Status: DC | PRN
  Administered 2021-09-02: 4 mg via INTRAVENOUS

## 2021-09-02 MED ORDER — OXYCODONE HCL 5 MG/5ML PO SOLN: 5.0000 mg | Freq: Once | ORAL | Status: DC | PRN

## 2021-09-02 MED ORDER — ACETAMINOPHEN 500 MG PO TABS: 1000.0000 mg | ORAL_TABLET | Freq: Once | ORAL | Status: DC

## 2021-09-02 MED ORDER — SUCCINYLCHOLINE CHLORIDE 200 MG/10ML IV SOSY
PREFILLED_SYRINGE | INTRAVENOUS | Status: DC | PRN
  Administered 2021-09-02: 140 mg via INTRAVENOUS

## 2021-09-02 MED ORDER — HYDROMORPHONE HCL 1 MG/ML IJ SOLN
0.2500 mg | INTRAMUSCULAR | Status: DC | PRN
Start: 2021-09-02 — End: 2021-09-02

## 2021-09-02 MED ORDER — LIDOCAINE 2% (20 MG/ML) 5 ML SYRINGE
INTRAMUSCULAR | Status: DC | PRN
  Administered 2021-09-02: 60 mg via INTRAVENOUS

## 2021-09-02 MED ORDER — MIDAZOLAM HCL 2 MG/2ML IJ SOLN
INTRAMUSCULAR | Status: AC
  Filled 2021-09-02: qty 2

## 2021-09-02 MED ORDER — SUGAMMADEX SODIUM 200 MG/2ML IV SOLN
INTRAVENOUS | Status: DC | PRN
  Administered 2021-09-02: 200 mg via INTRAVENOUS

## 2021-09-02 MED ORDER — LACTATED RINGERS IV SOLN: INTRAVENOUS | Status: DC | PRN

## 2021-09-02 MED ORDER — OXYCODONE HCL 5 MG PO TABS: 5.0000 mg | ORAL_TABLET | Freq: Once | ORAL | Status: DC | PRN

## 2021-09-02 MED ORDER — MIDAZOLAM HCL 2 MG/2ML IJ SOLN
INTRAMUSCULAR | Status: DC | PRN
  Administered 2021-09-02: 2 mg via INTRAVENOUS

## 2021-09-02 MED ORDER — FENTANYL CITRATE (PF) 100 MCG/2ML IJ SOLN
INTRAMUSCULAR | Status: AC
  Filled 2021-09-02: qty 2

## 2021-09-02 MED ORDER — DEXAMETHASONE SODIUM PHOSPHATE 10 MG/ML IJ SOLN
INTRAMUSCULAR | Status: DC | PRN
  Administered 2021-09-02: 10 mg via INTRAVENOUS

## 2021-09-02 MED ORDER — PHENYLEPHRINE 40 MCG/ML (10ML) SYRINGE FOR IV PUSH (FOR BLOOD PRESSURE SUPPORT)
PREFILLED_SYRINGE | INTRAVENOUS | Status: DC | PRN
  Administered 2021-09-02 (×2): 80 ug via INTRAVENOUS
  Administered 2021-09-02: 240 ug via INTRAVENOUS

## 2021-09-02 MED ORDER — ONDANSETRON HCL 4 MG/2ML IJ SOLN: 4.0000 mg | Freq: Once | INTRAMUSCULAR | Status: DC | PRN

## 2021-09-02 MED ORDER — LACTATED RINGERS IV SOLN
INTRAVENOUS | Status: AC | PRN
  Administered 2021-09-02: 20 mL/h via INTRAVENOUS

## 2021-09-02 MED ORDER — ROCURONIUM BROMIDE 10 MG/ML (PF) SYRINGE
PREFILLED_SYRINGE | INTRAVENOUS | Status: DC | PRN
  Administered 2021-09-02: 40 mg via INTRAVENOUS

## 2021-09-02 NOTE — Progress Notes (Signed)
[facility] for Infectious Disease  Date of Admission:  08/30/2021           Reason for visit: Follow up on liver abscess, cholangitis  Current antibiotics: Ertapenem 12/10-present   Previous antibiotics: Augmentin -> Cefdinir & Flagyl PTA   ASSESSMENT:    54 y.o. male admitted with:  Recurrent cholangitis and cystic liver lesions concerning for abscess due to #2 Choledocholithiasis: Complicated recent history with ERCP and pancreatic stenting (07/16/2021), laparoscopic cholecystectomy (07/17/2021; pathology showed chronic cholecystitis), and second ERCP (08/22/2021) with removal of further stones at that time.  Due to continued cholangitis symptoms a follow-up MRCP was obtained 08/29/2021 again noting possible intraductal calculus which was favored over an intraductal mass and other intraductal calculi also noted within the central right and left intrahepatic ducts.  S/p repeat ERCP 12/12 with removal of stone, placement of biliary stent due to finding of stricture in main bile duct, biopsy, and cytology Biliary ductal disease with intrahepatic ductal dilatation: patient has been following with GI for many years and was previously seen at [facility] in 2019 by Dr Posey Pronto.  Etiology of ductal disease unclear. Cirrhosis, splenomegaly Type 2 DM Sepsis due to # 1 and #2  RECOMMENDATIONS:    Continue ertapenem 1 gm daily which provides fairly broad coverage but offers once daily convenience for going home, however, I think his recurrent cholangitis was due more to source issue with ongoing choledocholithiasis than antibiotic failure due to resistant organisms Lab monitoring, follow up pathology and cytology Appreciate GI assistance Should be vaccinated against hep A and B outpatient as well as get updated pneumococcal vaccinations due to cirrhosis If blood cultures negative tomorrow, will place PICC for OPAT.  I think will either do ertapenem or ceftriaxone + metronidazole Will  follow   Principal Problem:   Liver abscess Active Problems:   Diabetes mellitus ([place])   Essential hypertension   Gastroesophageal reflux disease   Hyperlipidemia   Choledocholithiasis    MEDICATIONS:    Scheduled Meds: . (feeding supplement) PROSource Plus  30 mL Oral BID BM  . escitalopram  10 mg Oral Daily  . feeding supplement  1 Container Oral TID BM  . insulin aspart  0-9 Units Subcutaneous TID WC  . pantoprazole  40 mg Oral Daily  . rosuvastatin  10 mg Oral QHS   Continuous Infusions: . sodium chloride 20 mL/hr at 09/01/21 2114  . ertapenem 1,000 mg (09/01/21 1410)  . promethazine (PHENERGAN) injection (IM or IVPB) 12.5 mg (09/01/21 1931)   PRN Meds:.acetaminophen **OR** acetaminophen, ondansetron **OR** ondansetron (ZOFRAN) IV, oxyCODONE, promethazine (PHENERGAN) injection (IM or IVPB)  SUBJECTIVE:   24 hour events:  ERCP today Fever curve improved to 99.5 Tmax WBC 12.4 Creat 0.97 LFTs, AP, T bili relatively stable Hep A non-immune Hep B non-immune  Has some abdominal soreness, still recovering from anesthesia.  No fevers today.  Review of Systems  All other systems reviewed and are negative.    OBJECTIVE:   Blood pressure 121/84, pulse (!) 123, temperature (!) 97.3 F (36.3 C), resp. rate 18, height 6' (1.829 m), weight 95.8 kg, SpO2 95 %. Body mass index is 28.64 kg/m.  Physical Exam Constitutional:      General: He is not in acute distress.    Appearance: Normal appearance.  HENT:     Head: Normocephalic and atraumatic.  Pulmonary:     Effort: Pulmonary effort is normal. No respiratory distress.  Abdominal:     General: There is  no distension.  Musculoskeletal:        General: Normal range of motion.  Skin:    General: Skin is warm and dry.     Coloration: Skin is jaundiced.  Neurological:     General: No focal deficit present.     Mental Status: He is alert and oriented to person, place, and time.  Psychiatric:        Mood and  Affect: Mood normal.        Behavior: Behavior normal.     Lab Results: Lab Results  Component Value Date   WBC 12.4 (H) 09/02/2021   HGB 10.5 (L) 09/02/2021   HCT 32.5 (L) 09/02/2021   MCV 80.2 09/02/2021   PLT 368 09/02/2021    Lab Results  Component Value Date   NA 130 (L) 09/02/2021   K 4.3 09/02/2021   CO2 24 09/02/2021   GLUCOSE 119 (H) 09/02/2021   BUN 11 09/02/2021   CREATININE 0.97 09/02/2021   CALCIUM 8.8 (L) 09/02/2021   GFRNONAA >60 09/02/2021    Lab Results  Component Value Date   ALT 82 (H) 09/02/2021   AST 99 (H) 09/02/2021   ALKPHOS 705 (H) 09/02/2021   BILITOT 7.4 (H) 09/02/2021       Component Value Date/Time   CRP 159.2 (H) 08/26/2021 1155       Component Value Date/Time   ESRSEDRATE 94 (H) 08/26/2021 1155     I have reviewed the micro and lab results in Epic.  Imaging: No results found.   Imaging independently reviewed in Epic.    Raynelle Highland for Infectious Disease [facility] [phone] pager 09/02/2021, 5:59 AM

## 2021-09-02 NOTE — Op Note (Signed)
[facility] Patient Name: Omar Travis Procedure Date : 09/02/2021 MRN: [id number] Attending MD: Clarene Essex , MD Date of Birth: [birth year] CSN: [id number] Age: 55 Admit Type: Inpatient Procedure:                ERCP Indications:              Abnormal MRCP, For therapy of bile duct stone(s),                            Jaundice, Elevated liver enzymes Providers:                Clarene Essex, MD, Jeanella Cara, RN, Frazier Richards, Technician Referring MD:              Medicines:                General Anesthesia Complications:            No immediate complications. Estimated Blood Loss:     Estimated blood loss was minimal. Procedure:                Pre-Anesthesia Assessment:                           - Prior to the procedure, a History and Physical                            was performed, and patient medications and                            allergies were reviewed. The patient's tolerance of                            previous anesthesia was also reviewed. The risks                            and benefits of the procedure and the sedation                            options and risks were discussed with the patient.                            All questions were answered, and informed consent                            was obtained. Prior Anticoagulants: The patient has                            taken no previous anticoagulant or antiplatelet                            agents. ASA Grade Assessment: II - A patient with  mild systemic disease. After reviewing the risks                            and benefits, the patient was deemed in                            satisfactory condition to undergo the procedure.                           After obtaining informed consent, the scope was                            passed under direct vision. Throughout the                            procedure, the patient's blood  pressure, pulse, and                            oxygen saturations were monitored continuously. The                            [id number]-[id number] ([id number]) Olympus duodenoscope was                            introduced through the mouth, and used to inject                            contrast into and used to locate the major papilla.                            The ERCP was technically difficult and complex due                            to abnormal anatomy. Successful completion of the                            procedure was aided by performing the maneuvers                            documented (below) in this report. The patient                            tolerated the procedure well. Scope In: Scope Out: Findings:      A biliary sphincterotomy had been performed. The sphincterotomy appeared       widely open. The major papilla was normal. Deep selective cannulation       was readily obtained and to discover objects, the biliary tree was swept       with an 9- 12 adjustable mm balloon starting at the bifurcation and left       main hepatic duct. We used the 12 mm for the CBD and the 9 mm for the       left hepatic and nothing was found on multiple balloon pull-through's.       And no obvious abnormality  was seen on occlusion cholangiogram but we       did have trouble pulling the balloons through the proximal duct with a       moderate amount of resistance we then proceeded with the bile duct was       explored endoscopically using the SpyGlass direct visualization system.       The SpyScope was advanced to the bifurcation. Visibility with the scope       was good. The upper third of the main bile duct contained one stone,       which was medium-sized in diameter. It seemed to be impacted above the       strictured area and the upper third of the main bile duct contained a       single localized stenosis 2 mm in length. The strictured area and the       upper third of the main bile duct was  biopsied with a SpyBite miniature       biopsy forceps for histology. We then proceeded with electrohydraulic       lithotripsy which was successful. Multiple fragments were seen and       passed readily through the patent sphincterotomy site as did some sludge       and some signs of cholangitis and to discover objects, the biliary tree       was swept with an adjustable 9- 12 mm balloon starting at the       bifurcation and left main hepatic duct as above. Sludge was swept from       the duct. All stones were removed. Nothing was found on multiple balloon       pull-through's and on occlusion cholangiogram at the end of the       procedure and. Cells for cytology were obtained by brushing in the upper       third of the main bile duct. Because of the strictured area we elected       to place one 10 Fr by 12 cm plastic stent with a single external flap       and a single internal flap was placed 10.5 cm into the common bile duct.       Bile flowed through the stent. The stent was in good position. There was       no pancreatic duct injection or wire advancement throughout the       procedure. in the more distal duct on initial spyglass there seemed to       be a polypoid area not seen on any cholangiogram but on slow withdrawal       of the spy scope at the end of the procedure we did not appreciate this       area Impression:               - Prior biliary sphincterotomy appeared open.                           - The major papilla appeared normal.                           - A single localized biliary stricture was found in                            the upper third of the  main bile duct. The                            stricture was indeterminate.                           - Choledocholithiasis was found. Complete removal                            was accomplished by balloon extraction.                           - The biliary tree was swept and nothing was found                             at both the beginning of the procedure and at the                            end.                           - Biopsy was performed in the upper third of the                            main bile duct of the strictured area.                           - Lithotripsy was successful.                           - The biliary tree was swept and nothing was found                            at the end of the procedure.                           - Cells for cytology obtained in the upper third of                            the main bile duct.                           - One plastic stent was placed into the common bile                            duct. Questionable abnormal distal duct polypoid                            area not seen on spyglass withdrawal only seen on                            spyglass advancement Recommendation:           - Clear liquid diet today. If doing well at 5 PM  may have soft solids and advance diet further                            tomorrow as tolerated                           - Continue present medications.                           - Await cytology results and await path results.                            May need repeat spyglass at some point versus stent                            removal only depending on clinical course                           - Return to GI clinic PRN.                           - Telephone GI clinic for pathology results in 1                            week.                           - Telephone GI clinic if symptomatic PRN.                           - Check liver enzymes (AST, ALT, alkaline                            phosphatase, bilirubin) tomorrow. Procedure Code(s):        --- Professional ---                           [phone], Esophagogastroduodenoscopy, flexible,                            transoral; diagnostic, including collection of                            specimen(s) by brushing or washing, when  performed                            (separate procedure) Diagnosis Code(s):        --- Professional ---                           K80.51, Calculus of bile duct without cholangitis                            or cholecystitis with obstruction  R17, Unspecified jaundice                           R74.8, Abnormal levels of other serum enzymes                           R93.2, Abnormal findings on diagnostic imaging of                            liver and biliary tract CPT copyright 2019 [employer]. All rights reserved. The codes documented in this report are preliminary and upon coder review may  be revised to meet current compliance requirements. Clarene Essex, MD 09/02/2021 11:18:28 AM This report has been signed electronically. Number of Addenda: 0

## 2021-09-02 NOTE — Progress Notes (Signed)
This chaplain responded to the consult for creating/updating the Pt. Advance Directive. The Pt. wife-Betty Denice Paradise is at the bedside.   The Pt. is post procedure and having difficulty staying awake during AD education.  The chaplain left the Advance Directive with Armen Pickup, along with education on how to F/U with spiritual care through the RN for AD completion.  Chaplain Sallyanne Kuster [phone]

## 2021-09-02 NOTE — Anesthesia Procedure Notes (Signed)
Procedure Name: Intubation Date/Time: 09/02/2021 9:00 AM Performed by: Thelma Comp, CRNA Pre-anesthesia Checklist: Patient identified, Emergency Drugs available, Suction available and Patient being monitored Patient Re-evaluated:Patient Re-evaluated prior to induction Oxygen Delivery Method: Circle System Utilized Preoxygenation: Pre-oxygenation with 100% oxygen Induction Type: IV induction, Rapid sequence and Cricoid Pressure applied Ventilation: Mask ventilation without difficulty and Oral airway inserted - appropriate to patient size Laryngoscope Size: Mac and 4 Grade View: Grade I Tube type: Oral Tube size: 7.5 mm Number of attempts: 1 Airway Equipment and Method: Stylet and Oral airway Placement Confirmation: ETT inserted through vocal cords under direct vision, positive ETCO2 and breath sounds checked- equal and bilateral Secured at: 21 cm Tube secured with: Tape Dental Injury: Teeth and Oropharynx as per pre-operative assessment  Comments: Intubated by Drucie Opitz, Fernanda Drum

## 2021-09-02 NOTE — Progress Notes (Addendum)
Fever PROGRESS NOTE    MATTHIAS BOGUS  ZOX:[id number] DOB: 1965/12/03 DOA: 08/30/2021 PCP: Billie Ruddy, MD  Brief Narrative: 55/M with history of type 2 diabetes mellitus, hypertension, NASH/cirrhosis underwent ERCP with sphincterotomy, pancreatic stent placement for cholelithiasis on 10/25, followed by laparoscopic cholecystectomy on 10/26. -Following this was hospitalized with FUO, had  extensive work-up which was unrevealing. -Followed by infectious disease, on 12/1 underwent repeat ERCP, sphincterotomy and stone retrieval, following this has continued to have persistent fevers, mild right-sided abdominal discomfort.  Had an MRCP 12/8-which noted intrahepatic ductal dilation, CBD was nondilated, 12 mm filling defect in the common hepatic duct favoring calculus , in addition new small cystic lesions with peripheral rim enhancement in the left lobe and caudate lobe, suspicious for hepatic abscesses.  -He was seen in infectious disease clinic and directly admitted to [facility], febrile to 101 -Labs noted alk phos of 660, bilirubin of 6.3, WBC of 11.0    Assessment & Plan:   Liver abscesses Intrahepatic ductal dilation-?  Cholangitis -recent ERCP with sphincterotomy, stone retrieval 12/1 Dr. Watt Climes -MRCP notes small liver abscesses and filling defect in the common hepatic duct, intrahepatic ductal dilation -Day 3- IV ertapenem, blood cultures are negative, fever curve improving -Gastroenterology following, for ERCP per Dr. Watt Climes today -appreciate infectious disease input -Liver abscesses are too small, not amenable to IR drainage -Labs in a.m.  Liver cirrhosis, portal hypertension -Likely secondary to NASH  Type 2 diabetes mellitus -Oral hypoglycemics on hold, sliding scale insulin  Hypertension -Stable, lisinopril on hold  DVT prophylaxis: SCDs Code Status: Full code Family Communication: Wife at bedside Disposition Plan:  Status is: Inpatient  Remains inpatient appropriate  because: Severity of illness  Consultants:  Gastroenterology, infectious disease  Procedures: ERCP today  Antimicrobials:    Subjective: -Downstairs for ERCP, some abdominal discomfort yesterday after eating  Objective: Vitals:   09/02/21 1105 09/02/21 1120 09/02/21 1135 09/02/21 1215  BP: 118/75 120/82 117/75 120/77  Pulse: (!) 115 (!) 108 (!) 107 (!) 105  Resp: (!) 22 (!) 23 (!) 21 18  Temp: (!) 97.1 F (36.2 C)  (!) 97.1 F (36.2 C) 97.8 F (36.6 C)  TempSrc:    Oral  SpO2: 90% 95% 93% 95%  Weight:      Height:        Intake/Output Summary (Last 24 hours) at 09/02/2021 1313 Last data filed at 09/02/2021 1105 Gross per 24 hour  Intake 2573.23 ml  Output --  Net 2573.23 ml   Filed Weights   08/30/21 1942 09/01/21 0517  Weight: 97.9 kg 95.8 kg    Examination:  Pleasant male sitting up in bed, AAOx3, no distress HEENT: Positive icterus CVS: S1-S2, regular rate rhythm Lungs: Clear bilaterally Abdomen: Soft, nontender, bowel sounds present Extremities: No edema  Skin: Icteric skin  psychiatry: Judgement and insight appear normal. Mood & affect appropriate.     Data Reviewed:   CBC: Recent Labs  Lab 08/30/21 2306 08/31/21 0225 09/01/21 0228 09/02/21 0224  WBC 10.6* 11.0* 11.1* 12.4*  NEUTROABS  --  8.8*  --   --   HGB 10.3* 10.3* 10.6* 10.5*  HCT 31.5* 31.5* 31.7* 32.5*  MCV 80.8 81.4 79.3* 80.2  PLT 318 296 280 811   Basic Metabolic Panel: Recent Labs  Lab 08/30/21 2306 08/31/21 0225 09/01/21 0228 09/02/21 0224  NA 133* 134* 132* 130*  K 3.5 3.7 3.6 4.3  CL 100 98 97* 94*  CO2 _0 GLUCOSE 86  48* 126* 119*  BUN 7 5* 6 11  CREATININE 0.82 0.76 0.84 0.97  CALCIUM 8.5* 8.4* 8.9 8.8*   GFR: Estimated Creatinine Clearance: 105.8 mL/min (by C-G formula based on SCr of 0.97 mg/dL). Liver Function Tests: Recent Labs  Lab 08/30/21 2306 08/31/21 0225 09/01/21 0228 09/02/21 0224  AST 101* 101* 82* 99*  ALT 101* 98* 83* 82*   ALKPHOS 623* 662* 667* 705*  BILITOT 6.4* 6.3* 8.4* 7.4*  PROT 6.3* 6.2* 6.5 6.6  ALBUMIN 2.3* 2.3* 2.1* 2.0*   No results for input(s): LIPASE, AMYLASE in the last 168 hours.  No results for input(s): AMMONIA in the last 168 hours. Coagulation Profile: No results for input(s): INR, PROTIME in the last 168 hours. Cardiac Enzymes: No results for input(s): CKTOTAL, CKMB, CKMBINDEX, TROPONINI in the last 168 hours. BNP (last 3 results) No results for input(s): PROBNP in the last 8760 hours. HbA1C: No results for input(s): HGBA1C in the last 72 hours. CBG: Recent Labs  Lab 09/01/21 1119 09/01/21 1617 09/01/21 2002 09/02/21 0812 09/02/21 1111  GLUCAP 213* 148* 131* 128* 134*   Lipid Profile: No results for input(s): CHOL, HDL, LDLCALC, TRIG, CHOLHDL, LDLDIRECT in the last 72 hours. Thyroid Function Tests: No results for input(s): TSH, T4TOTAL, FREET4, T3FREE, THYROIDAB in the last 72 hours. Anemia Panel: No results for input(s): VITAMINB12, FOLATE, FERRITIN, TIBC, IRON, RETICCTPCT in the last 72 hours. Urine analysis:    Component Value Date/Time   COLORURINE YELLOW 08/19/2021 1855   APPEARANCEUR CLEAR 08/19/2021 1855   LABSPEC 1.031 (H) 08/19/2021 1855   PHURINE 5.0 08/19/2021 1855   GLUCOSEU >=500 (A) 08/19/2021 1855   HGBUR NEGATIVE 08/19/2021 1855   BILIRUBINUR NEGATIVE 08/19/2021 1855   KETONESUR 20 (A) 08/19/2021 1855   PROTEINUR NEGATIVE 08/19/2021 1855   NITRITE NEGATIVE 08/19/2021 1855   LEUKOCYTESUR NEGATIVE 08/19/2021 1855   Sepsis Labs: _0 (procalcitonin:4,lacticidven:4)  ) Recent Results (from the past 240 hour(s))  Resp Panel by RT-PCR (Flu A&B, Covid) Nasopharyngeal Swab     Status: None   Collection Time: 08/30/21  9:05 PM   Specimen: Nasopharyngeal Swab; Nasopharyngeal(NP) swabs in vial transport medium  Result Value Ref Range Status   SARS Coronavirus 2 by RT PCR NEGATIVE NEGATIVE Final    Comment: (NOTE) SARS-CoV-2 target nucleic acids  are NOT DETECTED.  The SARS-CoV-2 RNA is generally detectable in upper respiratory specimens during the acute phase of infection. The lowest concentration of SARS-CoV-2 viral copies this assay can detect is 138 copies/mL. A negative result does not preclude SARS-Cov-2 infection and should not be used as the sole basis for treatment or other patient management decisions. A negative result may occur with  improper specimen collection/handling, submission of specimen other than nasopharyngeal swab, presence of viral mutation(s) within the areas targeted by this assay, and inadequate number of viral copies(<138 copies/mL). A negative result must be combined with clinical observations, patient history, and epidemiological information. The expected result is Negative.  Fact Sheet for Patients:  EntrepreneurPulse.com.au  Fact Sheet for Healthcare Providers:  IncredibleEmployment.be  This test is no t yet approved or cleared by the [place] FDA and  has been authorized for detection and/or diagnosis of SARS-CoV-2 by FDA under an Emergency Use Authorization (EUA). This EUA will remain  in effect (meaning this test can be used) for the duration of the COVID-19 declaration under Section 564(b)(1) of the Act, 21 U.S.C.section 360bbb-3(b)(1), unless the authorization is terminated  or revoked sooner.  Influenza A by PCR NEGATIVE NEGATIVE Final   Influenza B by PCR NEGATIVE NEGATIVE Final    Comment: (NOTE) The Xpert Xpress SARS-CoV-2/FLU/RSV plus assay is intended as an aid in the diagnosis of influenza from Nasopharyngeal swab specimens and should not be used as a sole basis for treatment. Nasal washings and aspirates are unacceptable for Xpert Xpress SARS-CoV-2/FLU/RSV testing.  Fact Sheet for Patients: EntrepreneurPulse.com.au  Fact Sheet for Healthcare Providers: IncredibleEmployment.be  This test is  not yet approved or cleared by the [place] FDA and has been authorized for detection and/or diagnosis of SARS-CoV-2 by FDA under an Emergency Use Authorization (EUA). This EUA will remain in effect (meaning this test can be used) for the duration of the COVID-19 declaration under Section 564(b)(1) of the Act, 21 U.S.C. section 360bbb-3(b)(1), unless the authorization is terminated or revoked.  Performed at [facility], [place] [address] [place], [place] [address]   Culture, blood (routine x 2)     Status: None (Preliminary result)   Collection Time: 08/30/21 11:06 PM   Specimen: BLOOD  Result Value Ref Range Status   Specimen Description BLOOD LEFT ANTECUBITAL  Final   Special Requests   Final    BOTTLES DRAWN AEROBIC AND ANAEROBIC Blood Culture adequate volume   Culture   Final    NO GROWTH 3 DAYS Performed at [facility], [place] [address], [place], [place] [address]    Report Status PENDING  Incomplete  Culture, blood (routine x 2)     Status: None (Preliminary result)   Collection Time: 08/30/21 11:12 PM   Specimen: BLOOD LEFT HAND  Result Value Ref Range Status   Specimen Description BLOOD LEFT HAND  Final   Special Requests   Final    BOTTLES DRAWN AEROBIC AND ANAEROBIC Blood Culture adequate volume   Culture   Final    NO GROWTH 3 DAYS Performed at [facility], [place] [address] [place], [place] [address]    Report Status PENDING  Incomplete  Surgical PCR screen     Status: None   Collection Time: 08/31/21  4:49 AM   Specimen: Nasal Mucosa; Nasal Swab  Result Value Ref Range Status   MRSA, PCR NEGATIVE NEGATIVE Final   Staphylococcus aureus NEGATIVE NEGATIVE Final    Comment: (NOTE) The Xpert SA Assay (FDA approved for NASAL specimens in patients 54 years of age and older), is one component of a comprehensive surveillance program. It is not intended to diagnose infection nor to guide or monitor treatment. Performed at [facility],  [place] [address] [place],  [address]      Scheduled Meds:  (feeding supplement) PROSource Plus  30 mL Oral BID BM   escitalopram  10 mg Oral Daily   feeding supplement  1 Container Oral TID BM   insulin aspart  0-9 Units Subcutaneous TID WC   pantoprazole  40 mg Oral Daily   rosuvastatin  10 mg Oral QHS   Continuous Infusions:  ertapenem 1,000 mg (09/02/21 1222)   promethazine (PHENERGAN) injection (IM or IVPB) 12.5 mg (09/01/21 1931)     LOS: 3 days    Time spent: 57mn  PDomenic Polite MD [facility]   09/02/2021, 1:13 PM

## 2021-09-02 NOTE — Anesthesia Postprocedure Evaluation (Signed)
Anesthesia Post Note  Patient: Omar Travis  Procedure(s) Performed: ENDOSCOPIC RETROGRADE CHOLANGIOPANCREATOGRAPHY (ERCP) REMOVAL OF STONES SPYGLASS CHOLANGIOSCOPY SPYGLASS LITHOTRIPSY BIOPSY BILIARY STENT PLACEMENT BILIARY BRUSHING     Patient location during evaluation: PACU Anesthesia Type: General Level of consciousness: awake and alert, oriented and patient cooperative Pain management: pain level controlled Vital Signs Assessment: post-procedure vital signs reviewed and stable Respiratory status: spontaneous breathing, nonlabored ventilation and respiratory function stable Cardiovascular status: blood pressure returned to baseline and stable Postop Assessment: no apparent nausea or vomiting Anesthetic complications: no   No notable events documented.  Last Vitals:  Vitals:   09/02/21 1120 09/02/21 1135  BP: 120/82 117/75  Pulse: (!) 108 (!) 107  Resp: (!) 23 (!) 21  Temp:  (!) 36.2 C  SpO2: 95% 93%    Last Pain:  Vitals:   09/02/21 1135  TempSrc:   PainSc: 0-No pain                 Pervis Hocking

## 2021-09-02 NOTE — Transfer of Care (Signed)
Immediate Anesthesia Transfer of Care Note  Patient: Omar Travis  Procedure(s) Performed: ENDOSCOPIC RETROGRADE CHOLANGIOPANCREATOGRAPHY (ERCP) REMOVAL OF STONES SPYGLASS CHOLANGIOSCOPY SPYGLASS LITHOTRIPSY BIOPSY BILIARY STENT PLACEMENT BILIARY BRUSHING  Patient Location: PACU  Anesthesia Type:General  Level of Consciousness: drowsy and patient cooperative  Airway & Oxygen Therapy: Patient Spontanous Breathing  Post-op Assessment: Report given to RN and Post -op Vital signs reviewed and stable  Post vital signs: Reviewed and stable  Last Vitals:  Vitals Value Taken Time  BP 118/75 09/02/21 1105  Temp    Pulse 112 09/02/21 1110  Resp 22 09/02/21 1110  SpO2 87 % 09/02/21 1110  Vitals shown include unvalidated device data.  Last Pain:  Vitals:   09/02/21 0807  TempSrc: Temporal  PainSc: 0-No pain      Patients Stated Pain Goal: 0 (59/93/57 0177)  Complications: No notable events documented.

## 2021-09-02 NOTE — Progress Notes (Signed)
Omar Travis 8:49 AM  Subjective: Patient seen and examined and unfortunately well-known to me from his 2 previous ERCPs and his MRCP and previous ERCP pictures were reviewed and he has no new complaints  Objective: Vital signs stable afebrile no acute distress exam please see preassessment evaluation labs reviewed  Assessment: Residual CBD stones  Plan: Okay to proceed with ERCP with anesthesia assistance and possible spyglass at the end just to be sure and the above was discussed with the patient  [facility]  office [phone] After 5PM or if no answer call [phone]

## 2021-09-03 ENCOUNTER — Other Ambulatory Visit (HOSPITAL_COMMUNITY): Payer: Self-pay

## 2021-09-03 ENCOUNTER — Inpatient Hospital Stay: Payer: Self-pay

## 2021-09-03 DIAGNOSIS — K8050 Calculus of bile duct without cholangitis or cholecystitis without obstruction: Secondary | ICD-10-CM | POA: Diagnosis not present

## 2021-09-03 DIAGNOSIS — K750 Abscess of liver: Secondary | ICD-10-CM | POA: Diagnosis not present

## 2021-09-03 DIAGNOSIS — K8309 Other cholangitis: Secondary | ICD-10-CM | POA: Diagnosis not present

## 2021-09-03 DIAGNOSIS — R7989 Other specified abnormal findings of blood chemistry: Secondary | ICD-10-CM | POA: Diagnosis not present

## 2021-09-03 LAB — CBC
HCT: 31.2 % — ABNORMAL LOW (ref 39.0–52.0)
Hemoglobin: 10.1 g/dL — ABNORMAL LOW (ref 13.0–17.0)
MCH: 26 pg (ref 26.0–34.0)
MCHC: 32.4 g/dL (ref 30.0–36.0)
MCV: 80.4 fL (ref 80.0–100.0)
Platelets: 344 10*3/uL (ref 150–400)
RBC: 3.88 MIL/uL — ABNORMAL LOW (ref 4.22–5.81)
RDW: 16.1 % — ABNORMAL HIGH (ref 11.5–15.5)
WBC: 8.8 10*3/uL (ref 4.0–10.5)
nRBC: 0 % (ref 0.0–0.2)

## 2021-09-03 LAB — COMPREHENSIVE METABOLIC PANEL
ALT: 68 U/L — ABNORMAL HIGH (ref 0–44)
AST: 54 U/L — ABNORMAL HIGH (ref 15–41)
Albumin: 1.9 g/dL — ABNORMAL LOW (ref 3.5–5.0)
Alkaline Phosphatase: 688 U/L — ABNORMAL HIGH (ref 38–126)
Anion gap: 14 (ref 5–15)
BUN: 15 mg/dL (ref 6–20)
CO2: 22 mmol/L (ref 22–32)
Calcium: 8.9 mg/dL (ref 8.9–10.3)
Chloride: 95 mmol/L — ABNORMAL LOW (ref 98–111)
Creatinine, Ser: 0.86 mg/dL (ref 0.61–1.24)
GFR, Estimated: >60 mL/min (ref >60)
Glucose, Bld: 182 mg/dL — ABNORMAL HIGH (ref 70–99)
Potassium: 4.6 mmol/L (ref 3.5–5.1)
Sodium: 131 mmol/L — ABNORMAL LOW (ref 135–145)
Total Bilirubin: 4.4 mg/dL — ABNORMAL HIGH (ref 0.3–1.2)
Total Protein: 6.3 g/dL — ABNORMAL LOW (ref 6.5–8.1)

## 2021-09-03 LAB — GLUCOSE, CAPILLARY
Glucose-Capillary: 173 mg/dL — ABNORMAL HIGH (ref 70–99)
Glucose-Capillary: 227 mg/dL — ABNORMAL HIGH (ref 70–99)
Glucose-Capillary: 227 mg/dL — ABNORMAL HIGH (ref 70–99)
Glucose-Capillary: 264 mg/dL — ABNORMAL HIGH (ref 70–99)

## 2021-09-03 LAB — SURGICAL PATHOLOGY

## 2021-09-03 MED ORDER — SODIUM CHLORIDE 0.9% FLUSH
10.0000 mL | Freq: Two times a day (BID) | INTRAVENOUS | Status: DC
  Administered 2021-09-03: 10 mL

## 2021-09-03 MED ORDER — CHLORHEXIDINE GLUCONATE CLOTH 2 % EX PADS
6.0000 | MEDICATED_PAD | Freq: Every day | CUTANEOUS | Status: DC
  Administered 2021-09-03 – 2021-09-04 (×2): 6 via TOPICAL

## 2021-09-03 MED ORDER — SODIUM CHLORIDE 0.9 % IV SOLN
2.0000 g | INTRAVENOUS | Status: DC
  Administered 2021-09-03 – 2021-09-04 (×2): 2 g via INTRAVENOUS
  Filled 2021-09-03 (×2): qty 20

## 2021-09-03 MED ORDER — SODIUM CHLORIDE 0.9% FLUSH: 10.0000 mL | INTRAVENOUS | Status: DC | PRN

## 2021-09-03 MED ORDER — METRONIDAZOLE 500 MG PO TABS
500.0000 mg | ORAL_TABLET | Freq: Two times a day (BID) | ORAL | Status: DC
  Administered 2021-09-03 – 2021-09-04 (×3): 500 mg via ORAL
  Filled 2021-09-03 (×3): qty 1

## 2021-09-03 MED ORDER — METRONIDAZOLE 500 MG PO TABS
500.0000 mg | ORAL_TABLET | Freq: Two times a day (BID) | ORAL | 0 refills | Status: DC
  Filled 2021-09-03 – 2021-09-04 (×3): qty 54, 27d supply, fill #0

## 2021-09-03 MED ORDER — CEFTRIAXONE IV (FOR PTA / DISCHARGE USE ONLY): 2.0000 g | INTRAVENOUS | 0 refills | Status: DC

## 2021-09-03 NOTE — Progress Notes (Addendum)
Fever PROGRESS NOTE    Omar Travis  GEX:[id number] DOB: 12/06/1965 DOA: 08/30/2021 PCP: Billie Ruddy, MD  Brief Narrative: 55/M with history of type 2 diabetes mellitus, hypertension, NASH/cirrhosis underwent ERCP with sphincterotomy, pancreatic stent placement for cholelithiasis on 10/25, followed by laparoscopic cholecystectomy on 10/26. -Following this was hospitalized with FUO, had  extensive work-up which was unrevealing. -Followed by infectious disease, on 12/1 underwent repeat ERCP, sphincterotomy and stone retrieval, following this has continued to have persistent fevers, mild right-sided abdominal discomfort.  Had an MRCP 12/8-which noted intrahepatic ductal dilation, CBD was nondilated, 12 mm filling defect in the common hepatic duct favoring calculus , in addition new small cystic lesions with peripheral rim enhancement in the left lobe and caudate lobe, suspicious for hepatic abscesses.  -He was seen in infectious disease clinic and directly admitted to [facility], febrile to 101 -Labs noted alk phos of 660, bilirubin of 6.3, WBC of 11.0    Assessment & Plan:   Liver abscesses Cholangitis Sepsis, poa -recent ERCP with sphincterotomy, stone retrieval 12/1 Dr. Watt Climes -MRCP notes small liver abscesses and filling defect in the common hepatic duct, intrahepatic ductal dilation -Day 4 of IV ertapenem, clinically improving, blood cultures are negative -ERCP yesterday with sphincterotomy, stone retrieval, biliary stricture was noted, stent placed, biopsied -Bili down, ALP still remains considerably elevated, repeat labs in a.m. -Liver abscesses are too small, not amenable to IR drainage -ID consulting, plan for PICC line, 4 weeks of IV ceftriaxone and Flagyl to end on 09/29/2021  Liver cirrhosis, portal hypertension -Likely secondary to NASH  Type 2 diabetes mellitus -Oral hypoglycemics on hold, sliding scale insulin  Hypertension -Stable, lisinopril on hold  DVT prophylaxis:  SCDs Code Status: Full code Family Communication: Wife at bedside Disposition Plan:  Status is: Inpatient  Remains inpatient appropriate because: Severity of illness  Consultants:  Gastroenterology, infectious disease  Procedures: ERCP Dr. Watt Climes Impression:               - Prior biliary sphincterotomy appeared open.                           - The major papilla appeared normal.                           - A single localized biliary stricture was found in                            the upper third of the main bile duct. The                            stricture was indeterminate.                           - Choledocholithiasis was found. Complete removal                            was accomplished by balloon extraction.                           - The biliary tree was swept and nothing was found  at both the beginning of the procedure and at the                            end.                           - Biopsy was performed in the upper third of the                            main bile duct of the strictured area.                           - Lithotripsy was successful.                           - The biliary tree was swept and nothing was found                            at the end of the procedure.                           - Cells for cytology obtained in the upper third of                            the main bile duct.                           - One plastic stent was placed into the common bile                            duct. Questionable abnormal distal duct polypoid                            area not seen on spyglass withdrawal only seen on                            spyglass advancement  Antimicrobials:    Subjective: -Downstairs for ERCP, some abdominal discomfort yesterday after eating  Objective: Vitals:   09/02/21 1135 09/02/21 1215 09/02/21 2144 09/03/21 0723  BP: 117/75 120/77 104/71 108/74  Pulse: (!) 107 (!) 105 92 85  Resp: (!)  _0 Temp: (!) 97.1 F (36.2 C) 97.8 F (36.6 C) 97.7 F (36.5 C) (!) 97.5 F (36.4 C)  TempSrc:  Oral Oral Oral  SpO2: 93% 95% 95% 96%  Weight:      Height:       No intake or output data in the 24 hours ending 09/03/21 1126  Filed Weights   08/30/21 1942 09/01/21 0517  Weight: 97.9 kg 95.8 kg    Examination:  Pleasant male sitting up in bed, AAOx3, no distress HEENT: Positive icterus, improving CVS: S1-S2, regular rate rhythm Lungs: Clear bilaterally Abdomen: Soft, nontender, bowel sounds present Extremities: No edema Skin: Icteric psychiatry: Judgement and insight appear normal. Mood & affect appropriate.     Data Reviewed:   CBC: Recent Labs  Lab 08/30/21 2306 08/31/21 0225 09/01/21 0228 09/02/21 0224 09/03/21  0433  WBC 10.6* 11.0* 11.1* 12.4* 8.8  NEUTROABS  --  8.8*  --   --   --   HGB 10.3* 10.3* 10.6* 10.5* 10.1*  HCT 31.5* 31.5* 31.7* 32.5* 31.2*  MCV 80.8 81.4 79.3* 80.2 80.4  PLT 318 296 280 368 169   Basic Metabolic Panel: Recent Labs  Lab 08/30/21 2306 08/31/21 0225 09/01/21 0228 09/02/21 0224 09/03/21 0433  NA 133* 134* 132* 130* 131*  K 3.5 3.7 3.6 4.3 4.6  CL 100 98 97* 94* 95*  CO2 _0 GLUCOSE 86 48* 126* 119* 182*  BUN 7 5* _1 CREATININE 0.82 0.76 0.84 0.97 0.86  CALCIUM 8.5* 8.4* 8.9 8.8* 8.9   GFR: Estimated Creatinine Clearance: 119.3 mL/min (by C-G formula based on SCr of 0.86 mg/dL). Liver Function Tests: Recent Labs  Lab 08/30/21 2306 08/31/21 0225 09/01/21 0228 09/02/21 0224 09/03/21 0433  AST 101* 101* 82* 99* 54*  ALT 101* 98* 83* 82* 68*  ALKPHOS 623* 662* 667* 705* 688*  BILITOT 6.4* 6.3* 8.4* 7.4* 4.4*  PROT 6.3* 6.2* 6.5 6.6 6.3*  ALBUMIN 2.3* 2.3* 2.1* 2.0* 1.9*   No results for input(s): LIPASE, AMYLASE in the last 168 hours.  No results for input(s): AMMONIA in the last 168 hours. Coagulation Profile: No results for input(s): INR, PROTIME in the last 168 hours. Cardiac  Enzymes: No results for input(s): CKTOTAL, CKMB, CKMBINDEX, TROPONINI in the last 168 hours. BNP (last 3 results) No results for input(s): PROBNP in the last 8760 hours. HbA1C: No results for input(s): HGBA1C in the last 72 hours. CBG: Recent Labs  Lab 09/02/21 0812 09/02/21 1111 09/02/21 1546 09/02/21 2143 09/03/21 0626  GLUCAP 128* 134* 174* 215* 173*   Lipid Profile: No results for input(s): CHOL, HDL, LDLCALC, TRIG, CHOLHDL, LDLDIRECT in the last 72 hours. Thyroid Function Tests: No results for input(s): TSH, T4TOTAL, FREET4, T3FREE, THYROIDAB in the last 72 hours. Anemia Panel: No results for input(s): VITAMINB12, FOLATE, FERRITIN, TIBC, IRON, RETICCTPCT in the last 72 hours. Urine analysis:    Component Value Date/Time   COLORURINE YELLOW 08/19/2021 1855   APPEARANCEUR CLEAR 08/19/2021 1855   LABSPEC 1.031 (H) 08/19/2021 1855   PHURINE 5.0 08/19/2021 1855   GLUCOSEU >=500 (A) 08/19/2021 1855   HGBUR NEGATIVE 08/19/2021 1855   BILIRUBINUR NEGATIVE 08/19/2021 1855   KETONESUR 20 (A) 08/19/2021 1855   PROTEINUR NEGATIVE 08/19/2021 1855   NITRITE NEGATIVE 08/19/2021 1855   LEUKOCYTESUR NEGATIVE 08/19/2021 1855   Sepsis Labs: _2 (procalcitonin:4,lacticidven:4)  ) Recent Results (from the past 240 hour(s))  Resp Panel by RT-PCR (Flu A&B, Covid) Nasopharyngeal Swab     Status: None   Collection Time: 08/30/21  9:05 PM   Specimen: Nasopharyngeal Swab; Nasopharyngeal(NP) swabs in vial transport medium  Result Value Ref Range Status   SARS Coronavirus 2 by RT PCR NEGATIVE NEGATIVE Final    Comment: (NOTE) SARS-CoV-2 target nucleic acids are NOT DETECTED.  The SARS-CoV-2 RNA is generally detectable in upper respiratory specimens during the acute phase of infection. The lowest concentration of SARS-CoV-2 viral copies this assay can detect is 138 copies/mL. A negative result does not preclude SARS-Cov-2 infection and should not be used as the sole basis for  treatment or other patient management decisions. A negative result may occur with  improper specimen collection/handling, submission of specimen other than nasopharyngeal swab, presence of viral mutation(s) within the areas targeted by this assay, and inadequate number of viral copies(<138  copies/mL). A negative result must be combined with clinical observations, patient history, and epidemiological information. The expected result is Negative.  Fact Sheet for Patients:  EntrepreneurPulse.com.au  Fact Sheet for Healthcare Providers:  IncredibleEmployment.be  This test is no t yet approved or cleared by the [place] FDA and  has been authorized for detection and/or diagnosis of SARS-CoV-2 by FDA under an Emergency Use Authorization (EUA). This EUA will remain  in effect (meaning this test can be used) for the duration of the COVID-19 declaration under Section 564(b)(1) of the Act, 21 U.S.C.section 360bbb-3(b)(1), unless the authorization is terminated  or revoked sooner.       Influenza A by PCR NEGATIVE NEGATIVE Final   Influenza B by PCR NEGATIVE NEGATIVE Final    Comment: (NOTE) The Xpert Xpress SARS-CoV-2/FLU/RSV plus assay is intended as an aid in the diagnosis of influenza from Nasopharyngeal swab specimens and should not be used as a sole basis for treatment. Nasal washings and aspirates are unacceptable for Xpert Xpress SARS-CoV-2/FLU/RSV testing.  Fact Sheet for Patients: EntrepreneurPulse.com.au  Fact Sheet for Healthcare Providers: IncredibleEmployment.be  This test is not yet approved or cleared by the [place] FDA and has been authorized for detection and/or diagnosis of SARS-CoV-2 by FDA under an Emergency Use Authorization (EUA). This EUA will remain in effect (meaning this test can be used) for the duration of the COVID-19 declaration under Section 564(b)(1) of the Act, 21  U.S.C. section 360bbb-3(b)(1), unless the authorization is terminated or revoked.  Performed at [facility], [place] [address] [place], [place] [address]   Culture, blood (routine x 2)     Status: None (Preliminary result)   Collection Time: 08/30/21 11:06 PM   Specimen: BLOOD  Result Value Ref Range Status   Specimen Description BLOOD LEFT ANTECUBITAL  Final   Special Requests   Final    BOTTLES DRAWN AEROBIC AND ANAEROBIC Blood Culture adequate volume   Culture   Final    NO GROWTH 4 DAYS Performed at [facility], [place] [address] [place], [place] [address]    Report Status PENDING  Incomplete  Culture, blood (routine x 2)     Status: None (Preliminary result)   Collection Time: 08/30/21 11:12 PM   Specimen: BLOOD LEFT HAND  Result Value Ref Range Status   Specimen Description BLOOD LEFT HAND  Final   Special Requests   Final    BOTTLES DRAWN AEROBIC AND ANAEROBIC Blood Culture adequate volume   Culture   Final    NO GROWTH 4 DAYS Performed at [facility], [place] [address], [place], [place] [address]    Report Status PENDING  Incomplete  Surgical PCR screen     Status: None   Collection Time: 08/31/21  4:49 AM   Specimen: Nasal Mucosa; Nasal Swab  Result Value Ref Range Status   MRSA, PCR NEGATIVE NEGATIVE Final   Staphylococcus aureus NEGATIVE NEGATIVE Final    Comment: (NOTE) The Xpert SA Assay (FDA approved for NASAL specimens in patients 75 years of age and older), is one component of a comprehensive surveillance program. It is not intended to diagnose infection nor to guide or monitor treatment. Performed at [facility], [place] [address], [place], [place] [address]      Scheduled Meds:  (feeding supplement) PROSource Plus  30 mL Oral BID BM   escitalopram  10 mg Oral Daily   feeding supplement  1 Container Oral TID BM  insulin aspart  0-9 Units Subcutaneous TID WC   metroNIDAZOLE  500 mg Oral Q12H   pantoprazole  40 mg Oral Daily    rosuvastatin  10 mg Oral QHS   Continuous Infusions:  cefTRIAXone (ROCEPHIN)  IV 2 g (09/03/21 1036)   promethazine (PHENERGAN) injection (IM or IVPB) 12.5 mg (09/01/21 1931)     LOS: 4 days    Time spent: 54mn  PDomenic Polite MD [facility]   09/03/2021, 11:26 AM

## 2021-09-03 NOTE — Progress Notes (Signed)
Omar Travis 9:40 AM  Subjective: Patient seen and examined and case discussed extensively with his wife and all of their questions were answered and we rediscussed my procedure as well as the 2 previous ones and discussed diet and pathology which we are awaiting and hopes for the future and discussed his previous biliary issues where he was followed at [facility] from 2014-2017 and he has no obvious post ERCP issues although there is a little pain with coughing and a little abdominal discomfort  Objective: Vital signs stable afebrile no acute distress abdomen is soft nontender liver test trending right direction CBC okay  Assessment: Difficult to remove CBD stones probably impacted behind the stricture  Plan: Continue present management await pathology we will check on tomorrow  [facility]  office [phone] After 5PM or if no answer call [phone]

## 2021-09-03 NOTE — Plan of Care (Signed)
  Problem: Education: Goal: Knowledge of General Education information will improve Description: Including pain rating scale, medication(s)/side effects and non-pharmacologic comfort measures Outcome: Progressing   Problem: Health Behavior/Discharge Planning: Goal: Ability to manage health-related needs will improve Outcome: Progressing   Problem: Clinical Measurements: Goal: Ability to maintain clinical measurements within normal limits will improve Outcome: Progressing Goal: Will remain free from infection Outcome: Progressing Goal: Diagnostic test results will improve Outcome: Progressing   Problem: Activity: Goal: Risk for activity intolerance will decrease Outcome: Progressing   Problem: Nutrition: Goal: Adequate nutrition will be maintained Outcome: Progressing   Problem: Coping: Goal: Level of anxiety will decrease Outcome: Progressing   Problem: Pain Managment: Goal: General experience of comfort will improve Outcome: Progressing   Problem: Safety: Goal: Ability to remain free from injury will improve Outcome: Progressing   

## 2021-09-03 NOTE — Plan of Care (Signed)

## 2021-09-03 NOTE — Progress Notes (Signed)
Peripherally Inserted Central Catheter Placement  The IV Nurse has discussed with the patient and/or persons authorized to consent for the patient, the purpose of this procedure and the potential benefits and risks involved with this procedure.  The benefits include less needle sticks, lab draws from the catheter, and the patient may be discharged home with the catheter. Risks include, but not limited to, infection, bleeding, blood clot (thrombus formation), and puncture of an artery; nerve damage and irregular heartbeat and possibility to perform a PICC exchange if needed/ordered by physician.  Alternatives to this procedure were also discussed.  Bard Power PICC patient education guide, fact sheet on infection prevention and patient information card has been provided to patient /or left at bedside.    PICC Placement Documentation  PICC Single Lumen 09/03/21 Right Brachial 42 cm 0 cm (Active)  Indication for Insertion or Continuance of Line Home intravenous therapies (PICC only) 09/03/21 1640  Exposed Catheter (cm) 0 cm 09/03/21 1640  Site Assessment Dry;Clean;Intact 09/03/21 1640  Line Status Flushed;Blood return noted;Saline locked 09/03/21 1640  Dressing Type Transparent 09/03/21 1640  Dressing Status Clean;Dry;Intact 09/03/21 1640  Antimicrobial disc in place? Yes 09/03/21 1640  Dressing Change Due 09/10/21 09/03/21 1640       Scotty Court 09/03/2021, 4:42 PM

## 2021-09-03 NOTE — Progress Notes (Addendum)
  Transition of Care [facility]) Screening Note   Patient Details  Name: Omar Travis Date of Birth: 09-24-1965   [facility] CM/SW Contact:    Sharin Mons, RN Phone Number: [phone] 09/03/2021, 7:56 AM   Admitted with liver abscesses. Hx of diabetes, hypertension, s/p lap. cholecystectomy with recurrent choledocholithiasis. From home with wife. Independent with ADL's, no DME usage. PCP: Grier Mitts     -S/P ERCP 12/12   Transition of Care Department (TOC) has reviewed patient and no TOC needs have been identified at this time. We will continue to monitor patient advancement through interdisciplinary progression rounds. If new patient transition needs arise, please place a TOC consult. Nicoletta Dress RN,BSN,CM  09/03/2021 1500 Per ID pt requiring LT IV ABX therapy x 4 weeks. Pt agreeable to home health services. Pt without preference for home health agency. Wife to assist with care once d/c.  Referral made with [facility] for ABX therapy and [facility] for [facility] services, both accepted. PICC line pending. Home infusion teaching will be completed by Pan this evening.  TOC team will continue to monitor and assist with needs.Marland KitchenMarland KitchenMarland Kitchen

## 2021-09-03 NOTE — Progress Notes (Signed)
PHARMACY CONSULT NOTE FOR: Ceftriaxone  OUTPATIENT  PARENTERAL ANTIBIOTIC THERAPY (OPAT)  Indication: Liver abscess Regimen: Ceftriaxone 2g IV q24h + metronidazole 500mg  q12h orally End date: 09/29/2021  IV antibiotic discharge orders are pended. To discharging provider:  please sign these orders via discharge navigator,  Select New Orders & click on the button choice - Manage This Unsigned Work.     Thank you for allowing pharmacy to be a part of this patient's care.  Candie Mile 09/03/2021, 8:13 AM

## 2021-09-03 NOTE — Progress Notes (Signed)
[facility] Infectious Disease  Date of Admission:  08/30/2021           Reason for visit: Follow up on cholangitis, liver abscess  Current antibiotics: Ertapenem   ASSESSMENT:    55 y.o. male admitted with:  Recurrent cholangitis and cystic liver lesions concerning for abscess:  Suspect that recurrence of cholangitis was due to stone behind stricture that was removed during recent ERCP as opposed to antibiotic failure due to resistant GNR as no resistant organisms have been discovered.  LFTs, AP, Tbili all improved. Choledocholithiasis:  Complicated recent history with ERCP and pancreatic stenting (07/16/2021), laparoscopic cholecystectomy (07/17/2021; pathology showed chronic cholecystitis), and second ERCP (08/22/2021) with removal of further stones at that time.  Due to continued cholangitis symptoms a follow-up MRCP was obtained 08/29/2021 again noting possible intraductal calculus which was favored over an intraductal mass and other intraductal calculi also noted within the central right and left intrahepatic ducts.  S/p repeat ERCP 12/12 with removal of stone, placement of biliary stent due to finding of stricture in main bile duct, biopsy, and cytology Biliary ductal disease with intrahepatic ductal dilatation: patient has been following with GI for many years and was previously seen at [facility] by Dr Posey Pronto.  Etiology of ductal disease unclear.  Path and cytology from recent ERCP pending. Cirrhosis, splenomegaly Type 2 DM Sepsis due to #1 and #2: improving  RECOMMENDATIONS:    Will narrow coverage to ceftriaxone and flagyl Place picc line Lab monitoring.  Follow up path and cytology Should be vaccinated against hep A and B outpatient as well as get updated pneumococcal vaccinations due to cirrhosis See OPAT note below.  Pt has appt with Dr Tommy Medal for later this week which they will keep  Diagnosis: Cholangitis, liver abscess  Culture Result: NGTD  Allergies   Allergen Reactions   Bee Venom Hives and Swelling   Benadryl Allergy [Diphenhydramine Hcl] Rash   Flexeril [Cyclobenzaprine] Rash    OPAT Orders Discharge antibiotics to be given via PICC line Discharge antibiotics: Per pharmacy protocol  Ceftriaxone 2gm IV Metronidazole 523m BID PO  Duration: 4 weeks End Date: 09/29/2021  [facility]:  Home health RN for IV administration and teaching; PICC line care and labs.    Labs weekly while on IV antibiotics: _xx_ CBC with differential __ BMP _xx_ CMP __ CRP __ ESR __ Vancomycin trough __ CK  __ Please pull PIC at completion of IV antibiotics _xx_ Please leave PIC in place until doctor has seen patient or been notified  Fax weekly labs to ([phone] Clinic Follow Up Appt: 09/05/21 with Dr VTommy Medalat 10am    Principal Problem:   Liver abscess Active Problems:   [place]   Essential hypertension   Gastroesophageal reflux disease   Hyperlipidemia   Choledocholithiasis    MEDICATIONS:    Scheduled Meds:  (feeding supplement) PROSource Plus  30 mL Oral BID BM   escitalopram  10 mg Oral Daily   feeding supplement  1 Container Oral TID BM   insulin aspart  0-9 Units Subcutaneous TID WC   metroNIDAZOLE  500 mg Oral Q12H   pantoprazole  40 mg Oral Daily   rosuvastatin  10 mg Oral QHS   Continuous Infusions:  cefTRIAXone (ROCEPHIN)  IV     promethazine (PHENERGAN) injection (IM or IVPB) 12.5 mg (09/01/21 1931)   PRN Meds:.acetaminophen **OR** acetaminophen, ondansetron **OR** ondansetron (ZOFRAN) IV, oxyCODONE, promethazine (PHENERGAN)  injection (IM or IVPB)  SUBJECTIVE:   24 hour events:  Doing better this AM.  Has some post-ERCP discomfort.  No fevers, chills.  Discussed IV antibiotics and PICC line.  Review of Systems  All other systems reviewed and are negative.    OBJECTIVE:   Blood pressure 108/74, pulse 85, temperature (!) 97.5 F (36.4 C), temperature  source Oral, resp. rate 18, height 6' (1.829 m), weight 95.8 kg, SpO2 96 %. Body mass index is 28.64 kg/m.  Physical Exam Constitutional:      General: He is not in acute distress.    Appearance: Normal appearance.  Pulmonary:     Effort: Pulmonary effort is normal. No respiratory distress.  Abdominal:     General: There is no distension.     Palpations: Abdomen is soft.  Skin:    General: Skin is warm and dry.     Comments: Jaundice improved.   Neurological:     General: No focal deficit present.     Mental Status: He is alert and oriented to person, place, and time.  Psychiatric:        Mood and Affect: Mood normal.        Behavior: Behavior normal.     Lab Results: Lab Results  Component Value Date   WBC 8.8 09/03/2021   HGB 10.1 (L) 09/03/2021   HCT 31.2 (L) 09/03/2021   MCV 80.4 09/03/2021   PLT 344 09/03/2021    Lab Results  Component Value Date   NA 131 (L) 09/03/2021   K 4.6 09/03/2021   CO2 22 09/03/2021   GLUCOSE 182 (H) 09/03/2021   BUN 15 09/03/2021   CREATININE 0.86 09/03/2021   CALCIUM 8.9 09/03/2021   GFRNONAA >60 09/03/2021    Lab Results  Component Value Date   ALT 68 (H) 09/03/2021   AST 54 (H) 09/03/2021   ALKPHOS 688 (H) 09/03/2021   BILITOT 4.4 (H) 09/03/2021       Component Value Date/Time   CRP 159.2 (H) 08/26/2021 1155       Component Value Date/Time   ESRSEDRATE 94 (H) 08/26/2021 1155     I have reviewed the micro and lab results in Epic.  Imaging: DG ERCP  Result Date: 09/02/2021 CLINICAL DATA:  55 year old male with history of cholecystectomy and choledocholithiasis. EXAM: ERCP TECHNIQUE: Multiple spot images obtained with the fluoroscopic device and submitted for interpretation post-procedure. FLUOROSCOPY TIME:  Fluoroscopy Time:  7.24 minutes Radiation Exposure Index (if provided by the fluoroscopic device): 156.75 mGy Number of Acquired Spot Images: 9 COMPARISON:  08/29/2021, 08/22/2021 FINDINGS: Duodenal scope is  positioned over the second portion the duodenum with retrograde cannulation of the common bile duct. The wire extends into the intrahepatic biliary tree. Retrograde cholangiogram demonstrates no evidence of significant intra or extrahepatic biliary ductal dilation. Plastic common bile duct stent is placed. IMPRESSION: Placement of plastic common bile duct stent. These images were submitted for radiologic interpretation only. Please see the procedural report for full procedural details as well as the amount of contrast and the fluoroscopy time utilized. Electronically Signed   By: Ruthann Cancer M.D.   On: 09/02/2021 11:07   DG C-Arm 1-60 Min-No Report  Result Date: 09/02/2021 Fluoroscopy was utilized by the requesting physician.  No radiographic interpretation.   [place] EKG SITE RITE  Result Date: 09/03/2021 If Site Rite image not attached, placement could not be confirmed due to current cardiac rhythm.    Imaging independently reviewed in Epic.  [facility] [phone] pager 09/03/2021, 10:11 AM  I spent greater than 35 minutes with the patient including greater than 50% of time in face to face counsel of the patient and in coordination of their care.

## 2021-09-04 ENCOUNTER — Other Ambulatory Visit (HOSPITAL_COMMUNITY): Payer: Self-pay

## 2021-09-04 ENCOUNTER — Encounter (HOSPITAL_COMMUNITY): Payer: Self-pay | Admitting: Gastroenterology

## 2021-09-04 ENCOUNTER — Ambulatory Visit (HOSPITAL_COMMUNITY): Payer: BC Managed Care – PPO

## 2021-09-04 LAB — COMPREHENSIVE METABOLIC PANEL
ALT: 55 U/L — ABNORMAL HIGH (ref 0–44)
AST: 42 U/L — ABNORMAL HIGH (ref 15–41)
Albumin: 1.9 g/dL — ABNORMAL LOW (ref 3.5–5.0)
Alkaline Phosphatase: 647 U/L — ABNORMAL HIGH (ref 38–126)
Anion gap: 7 (ref 5–15)
BUN: 15 mg/dL (ref 6–20)
CO2: 28 mmol/L (ref 22–32)
Calcium: 8.3 mg/dL — ABNORMAL LOW (ref 8.9–10.3)
Chloride: 95 mmol/L — ABNORMAL LOW (ref 98–111)
Creatinine, Ser: 0.66 mg/dL (ref 0.61–1.24)
GFR, Estimated: >60 mL/min (ref >60)
Glucose, Bld: 247 mg/dL — ABNORMAL HIGH (ref 70–99)
Potassium: 3.9 mmol/L (ref 3.5–5.1)
Sodium: 130 mmol/L — ABNORMAL LOW (ref 135–145)
Total Bilirubin: 2.7 mg/dL — ABNORMAL HIGH (ref 0.3–1.2)
Total Protein: 5.6 g/dL — ABNORMAL LOW (ref 6.5–8.1)

## 2021-09-04 LAB — CULTURE, BLOOD (ROUTINE X 2)
Culture: NO GROWTH
Culture: NO GROWTH
Special Requests: ADEQUATE
Special Requests: ADEQUATE

## 2021-09-04 LAB — CBC
HCT: 29.3 % — ABNORMAL LOW (ref 39.0–52.0)
Hemoglobin: 9.2 g/dL — ABNORMAL LOW (ref 13.0–17.0)
MCH: 26 pg (ref 26.0–34.0)
MCHC: 31.4 g/dL (ref 30.0–36.0)
MCV: 82.8 fL (ref 80.0–100.0)
Platelets: 331 10*3/uL (ref 150–400)
RBC: 3.54 MIL/uL — ABNORMAL LOW (ref 4.22–5.81)
RDW: 16.1 % — ABNORMAL HIGH (ref 11.5–15.5)
WBC: 8.3 10*3/uL (ref 4.0–10.5)
nRBC: 0 % (ref 0.0–0.2)

## 2021-09-04 LAB — GLUCOSE, CAPILLARY
Glucose-Capillary: 243 mg/dL — ABNORMAL HIGH (ref 70–99)
Glucose-Capillary: 277 mg/dL — ABNORMAL HIGH (ref 70–99)

## 2021-09-04 LAB — CYTOLOGY - NON PAP

## 2021-09-04 NOTE — TOC Transition Note (Signed)
Transition of Care [facility]) - CM/SW Discharge Note   Patient Details  Name: Omar Travis MRN: [id number] Date of Birth: [birth year]  Transition of Care [facility]) CM/SW Contact:  Sharin Mons, RN Phone Number: 09/04/2021, 10:34 AM   Clinical Narrative:    Patient will DC to: home Anticipated DC date: 09/04/2021 Family notified: yes Transport by: car  Per MD patient ready for DC today. RN, patient, patient's wife and [facility] [facility] 12/15) / [facility] notified of DC.  Pt without Rx med concerns. Post hospital f/u noted on AVS.  Wife to provide transportation to home.   Omar Travis (Spouse)   Showing 1 of 1     [phone]      RNCM will sign off for now as intervention is no longer needed. Please consult [place] again if new needs arise.    Final next level of care: Home w Home Health Services Barriers to Discharge: No Barriers Identified   Patient Goals and CMS Choice     Choice offered to / list presented to : Patient  Discharge Placement                       Discharge Plan and Services   Discharge Planning Services: CM Consult            DME Arranged: Other see comment ([facility])   Date DME Agency Contacted: 09/03/21 ([facility]) Time DME Agency Contacted: 1500 Representative spoke with at DME Agency: [place]: RN [place] Agency: Other - See comment ([place]) Date [place]: 09/03/21 Time [place]: [place] Representative spoke with at [place]: pam  Social Determinants of Health ([place]) Interventions     Readmission Risk Interventions No flowsheet data found.

## 2021-09-04 NOTE — Progress Notes (Signed)
Inpatient Diabetes Program Recommendations  AACE/ADA: New Consensus Statement on Inpatient Glycemic Control   Target Ranges:  Prepandial:   less than 140 mg/dL      Peak postprandial:   less than 180 mg/dL (1-2 hours)      Critically ill patients:  140 - 180 mg/dL    Latest Reference Range & Units 09/03/21 17:08 09/03/21 20:14 09/04/21 06:58 09/04/21 07:54  Glucose-Capillary 70 - 99 mg/dL 227 (H) 264 (H) 243 (H) 277 (H)   Review of Glycemic Control  Diabetes history: DM2 Outpatient Diabetes medications: Invokana 300 mg QAM, Toujeo 20 units daily, Ozempic 1 mg Qweek Current orders for Inpatient glycemic control: Novolog 0-9 units TID with meals  Inpatient Diabetes Program Recommendations:    Insulin: Please consider ordering Semglee 5 units Q24H and Novolog 0-5 units QHS.   Thanks, Barnie Alderman, RN, MSN, CDE Diabetes Coordinator Inpatient Diabetes Program [phone] (Team Pager from 8am to 5pm)

## 2021-09-04 NOTE — Progress Notes (Signed)
Patient and spouse verbalized understanding of discharge instructions and medication administration. PICC in place with appropriate services ordered for follow-up in the home environment. Education was completed.

## 2021-09-04 NOTE — Discharge Summary (Signed)
Physician Discharge Summary  Omar Travis PYK:[id number] DOB: 1966-09-16 DOA: 08/30/2021  PCP: Billie Ruddy, MD  Admit date: 08/30/2021 Discharge date: 09/04/2021  Time spent: 35 minutes  Recommendations for Outpatient Follow-up:  Infectious disease Dr. Drucilla Schmidt in 1 to 2 weeks Gastroenterology Dr.Magod in 2 weeks, follow-up biliary stricture biopsy, repeat LFTs Will need biliary stent changed in 6 to 12 weeks  Discharge Diagnoses:  Principal Problem:   Liver abscess Cholangitis Choledocholithiasis NASH/cirrhosis   Diabetes mellitus ([place])   Essential hypertension   Gastroesophageal reflux disease   Hyperlipidemia   Choledocholithiasis   Discharge Condition: Stable  Diet recommendation: Diabetic, low-sodium  Filed Weights   08/30/21 1942 09/01/21 0517  Weight: 97.9 kg 95.8 kg    History of present illness:   Omar Travis with history of type 2 diabetes mellitus, hypertension, NASH/cirrhosis underwent ERCP with sphincterotomy, pancreatic stent placement for cholelithiasis on 10/25, followed by laparoscopic cholecystectomy on 10/26. -Following this was hospitalized with FUO, had  extensive work-up which was unrevealing. -Followed by infectious disease, on 12/1 underwent repeat ERCP, sphincterotomy and stone retrieval, following this has continued to have persistent fevers, mild right-sided abdominal discomfort.  Had an MRCP 12/8-which noted intrahepatic ductal dilation, CBD was nondilated, 12 mm filling defect in the common hepatic duct favoring calculus , in addition new small cystic lesions with peripheral rim enhancement in the left lobe and caudate lobe, suspicious for hepatic abscesses.  -He was seen in infectious disease clinic and directly admitted to [facility], febrile to 101 -Labs noted alk phos of 660, bilirubin of 6.3, WBC of 11.0   Hospital Course:   Liver abscesses Cholangitis Sepsis, poa -Prior to this admission had a recent ERCP with sphincterotomy, stone retrieval  12/1 Dr. Watt Climes -Presented with fevers, worsening jaundice, MRCP on admission noted small liver abscesses and filling defect in the common hepatic duct, intrahepatic ductal dilation -Treated with IV ertapenem X 4 days day while inpatient, clinically improving, blood cultures are negative -Underwent ERCP 12/12 with sphincterotomy, stone retrieval, biliary stricture was noted, stent placed, biopsied -Bilirubin and alkaline phosphatase improving -Liver abscesses are too small, not amenable to IR drainage -ID consulted as well, PICC line was placed, plan for 4 weeks of IV ceftriaxone and Flagyl to end on 09/29/2021 -Follow-up with infectious disease Dr. Drucilla Schmidt and gastroenterology Dr.Magod in 1-2 weeks   Liver cirrhosis, portal hypertension -Likely secondary to NASH   Type 2 diabetes mellitus -Insulin, Ozempic resumed at discharge   Hypertension -Stable, lisinopril resumed    Consultants:  Gastroenterology, infectious disease   Procedures: ERCP Dr. Watt Climes Impression:               - Prior biliary sphincterotomy appeared open.                           - The major papilla appeared normal.                           - A single localized biliary stricture was found in                            the upper third of the main bile duct. The                            stricture was indeterminate.                           -  Choledocholithiasis was found. Complete removal                            was accomplished by balloon extraction.                           - The biliary tree was swept and nothing was found                            at both the beginning of the procedure and at the                            end.                           - Biopsy was performed in the upper third of the                            main bile duct of the strictured area.                           - Lithotripsy was successful.                           - The biliary tree was swept and nothing was found                             at the end of the procedure.                           - Cells for cytology obtained in the upper third of                            the main bile duct.                           - One plastic stent was placed into the common bile                            duct. Questionable abnormal distal duct polypoid                            area not seen on spyglass withdrawal only seen on                            spyglass advancement    Discharge Exam: Vitals:   09/03/21 2014 09/04/21 0756  BP: 113/76 100/68  Pulse: 91 92  Resp: 17 18  Temp: 97.8 F (36.6 C) 98.6 F (37 C)  SpO2: 96% 96%   Pleasant male sitting up in bed, AAOx3, no distress HEENT: Positive icterus, improving CVS: S1-S2, regular rate rhythm Lungs: Clear bilaterally Abdomen: Soft, nontender, bowel sounds present Extremities: No edema Skin: Icteric psychiatry: Judgement and insight appear normal. Mood & affect appropriate.     Discharge Instructions   Discharge  Instructions     [facility] pharmacist to adjust dose for Vancomycin, Aminoglycosides and other anti-infective therapies as requested by physician.   Complete by: As directed    [facility] to provide Cath Flo 106m   Complete by: As directed    Administer for PICC line occlusion and as ordered by physician for other access device issues.   Anaphylaxis Kit: Provided to treat any anaphylactic reaction to the medication being provided to the patient if First Dose or when requested by physician   Complete by: As directed    Epinephrine 172mml vial / amp: Administer 0.62m79m0.62ml2mubcutaneously once for moderate to severe anaphylaxis, nurse to call physician and pharmacy when reaction occurs and call 911 if needed for immediate care   Diphenhydramine 50mg40mIV vial: Administer 25-50mg 51mM PRN for first dose reaction, rash, itching, mild reaction, nurse to call physician and pharmacy when reaction occurs   Sodium  Chloride 0.9% NS 500ml I42mdminister if needed for hypovolemic blood pressure drop or as ordered by physician after call to physician with anaphylactic reaction   Change dressing on IV access line weekly and PRN   Complete by: As directed    Diet - low sodium heart healthy   Complete by: As directed    Diet Carb Modified   Complete by: As directed    Flush IV access with Sodium Chloride 0.9% and Heparin 10 units/ml or 100 units/ml   Complete by: As directed    Home infusion instructions - [facility]   Complete by: As directed    Instructions: Flush IV access with Sodium Chloride 0.9% and Heparin 10units/ml or 100units/ml   Change dressing on IV access line: Weekly and PRN   Instructions Cath Flo 2mg: Ad31mister for PICC Line occlusion and as ordered by physician for other access device   [facility] pharmacist to adjust dose for: Vancomycin, Aminoglycosides and other anti-infective therapies as requested by physician   Increase activity slowly   Complete by: As directed    Method of administration may be changed at the discretion of home infusion pharmacist based upon assessment of the patient and/or caregivers ability to self-administer the medication ordered   Complete by: As directed    No wound care   Complete by: As directed    Outpatient Parenteral Antibiotic Therapy Information Antibiotic: Ceftriaxone (Rocephin) IVPB; Indications for use: liver abscess; End Date: 09/29/2021   Complete by: As directed    Antibiotic: Ceftriaxone (Rocephin) IVPB   Indications for use: liver abscess   End Date: 09/29/2021      Allergies as of 09/04/2021       Reactions   Bee Venom Hives, Swelling   Benadryl Allergy [diphenhydramine Hcl] Rash   Flexeril [cyclobenzaprine] Rash        Medication List     STOP taking these medications    cefdinir 300 MG capsule Commonly known as: OMNICEF   ibuprofen 200 MG tablet Commonly known as: ADVIL       TAKE these  medications    BD Pen Needle Nano U/F 32G X 4 MM Misc Generic drug: Insulin Pen Needle 1 each by Other route 2 (two) times daily.   canagliflozin 300 MG Tabs tablet Commonly known as: INVOKANA Take 300 mg by mouth daily before breakfast.   cefTRIAXone  IVPB Commonly known as: ROCEPHIN Inject 2 g into the vein daily. Indication:  liver abscess First Dose: No Last Day of Therapy:  09/29/2021 Labs -  Once weekly:  CBC/D and BMP, Labs - Every other week:  ESR and CRP Method of administration: IV Push Method of administration may be changed at the discretion of home infusion pharmacist based upon assessment of the patient and/or caregiver's ability to self-administer the medication ordered.   Dexcom G6 Sensor Misc Change sensor every 10 days   Dexcom G6 Transmitter Misc Use as directed   escitalopram 10 MG tablet Commonly known as: LEXAPRO Take 1 tablet (10 mg total) by mouth daily.   fenofibrate 145 MG tablet Commonly known as: TRICOR Take 145 mg by mouth daily.   lisinopril 5 MG tablet Commonly known as: ZESTRIL TAKE ONE (1) TABLET EACH DAY What changed: See the new instructions.   metroNIDAZOLE 500 MG tablet Commonly known as: FLAGYL Take 1 tablet (500 mg total) by mouth every 12 (twelve) hours for 27 days. What changed: when to take this   omeprazole 20 MG capsule Commonly known as: PRILOSEC Take 1 capsule (20 mg total) by mouth daily.   ondansetron 8 MG disintegrating tablet Commonly known as: ZOFRAN-ODT Take 8 mg by mouth every 8 (eight) hours as needed for nausea.   OneTouch Verio test strip Generic drug: glucose blood 1 each by Other route 2 (two) times daily.   oxyCODONE 5 MG immediate release tablet Commonly known as: Oxy IR/ROXICODONE Take 1 tablet (5 mg total) by mouth every 6 (six) hours as needed for moderate pain or severe pain (not relieved by tylenol or ibuprofen.).   Ozempic (1 MG/DOSE) 4 MG/3ML Sopn Generic drug: Semaglutide (1 MG/DOSE) Inject  1 mg into the skin every Monday.   rosuvastatin 10 MG tablet Commonly known as: CRESTOR Take 10 mg by mouth at bedtime.   Toujeo SoloStar 300 UNIT/ML Solostar Pen Generic drug: insulin glargine (1 Unit Dial) Inject 20 Units into the skin daily. What changed:  how much to take when to take this               Discharge Care Instructions  (From admission, onward)           Start     Ordered   09/03/21 0000  Change dressing on IV access line weekly and PRN  (Home infusion instructions - [facility] )        09/03/21 1518           Allergies  Allergen Reactions   Bee Venom Hives and Swelling   Benadryl Allergy [Diphenhydramine Hcl] Rash   Flexeril [Cyclobenzaprine] Rash    Follow-up Information     Billie Ruddy, MD Follow up.   Specialty: Family Medicine Contact information: [place] [address] [phone]         Tommy Medal, Lavell Islam, MD. Go in 1 week(s).   Specialty: Infectious Diseases Contact information: [address] [place] [address] [phone]         Clarene Essex, MD Follow up in 2 week(s).   Specialty: Gastroenterology Contact information: [address] [place] [address] [phone]                  The results of significant diagnostics from this hospitalization (including imaging, microbiology, ancillary and laboratory) are listed below for reference.    Significant Diagnostic Studies: CT Abdomen Pelvis W Contrast  Result Date: 08/20/2021 CLINICAL DATA:  Initial evaluation for acute right lower quadrant abdominal pain, appendicitis suspected. EXAM: CT ABDOMEN AND PELVIS WITH CONTRAST TECHNIQUE: Multidetector CT imaging  of the abdomen and pelvis was performed using the standard protocol following bolus administration of intravenous contrast. CONTRAST:  43m OMNIPAQUE IOHEXOL 350 MG/ML SOLN COMPARISON:  Prior CT from 07/22/2021. FINDINGS: Lower chest:  3 mm left lower lobe pulmonary nodule, similar to prior (series 5, image 7). Visualized lungs are otherwise clear. Hepatobiliary: Morphologic changes suggestive of cirrhosis again noted. Marked biliary dilatation involving hepatic segment 6 in the caudate lobe again noted, mildly worsened in appearance as compared to previous. Progressive intrahepatic biliary dilatation noted within the left hepatic lobe as well (series 4, image 18). Common bile duct remains normal in caliber. Small focus of pneumobilia noted within the common duct itself, which could be iatrogenic a related to prior sphincterotomy (series 4, image 31). No visible choledocholithiasis. Prior cholecystectomy. Residual postoperative stranding about the surgical site without collection or biloma. Pancreas: Pancreas within normal limits. No peripancreatic inflammation or abnormal ductal dilatation. Spleen: Mild splenomegaly, similar to previous. No other focal or acute abnormality. Adrenals/Urinary Tract: Adrenal glands within normal limits. The kidneys equal size with symmetric enhancement. Few scattered parapelvic cyst noted about the left kidney, stable. No nephrolithiasis or hydronephrosis. No focal enhancing renal mass. No hydroureter or ureterolithiasis. Bladder largely decompressed without acute finding. Stomach/Bowel: Stomach largely decompressed without acute abnormality. No evidence for bowel obstruction. Appendix within normal limits without evidence for acute appendicitis. No acute inflammatory changes seen about the bowels. Vascular/Lymphatic: Normal intravascular enhancement seen throughout the intra-abdominal aorta. No aneurysm. Mesenteric vessels patent proximally. Portal and splenic veins are patent. Probable collateral vessels noted near the splenic hilum, stable. Prominent 1 cm porta hepatis node, which could be reactive (series 4, image 30). Few additional scattered subcentimeter nodes within the mesentery with hazy mesenteric  stranding, stable from prior. No other new adenopathy. Reproductive: Prostate and seminal vesicles within normal limits. Other: No free fluid or ascites.  No free air. Musculoskeletal: No acute osseous finding. No discrete or worrisome osseous lesions. IMPRESSION: 1. Postoperative changes from recent cholecystectomy. Normal expected postoperative stranding within the operative bed without collection. 2. Chronic intrahepatic biliary dilatation involving the right and caudate lobes, with more mild involvement of the left hepatic lobe. Appearance is mildly worsened as compared to 07/22/2021. Common bile duct of normal caliber. No visible choledocholithiasis. Small focus of gas within the CBD could be iatrogenic. 3. Cirrhosis with splenomegaly and probable portal hypertension, stable. 4. Hazy mesenteric stranding with shotty subcentimeter lymph nodes, of uncertain significance, but can be seen with adenitis/panniculitis, although can be seen in asymptomatic patients as well. Finding is similar to previous. 5. Normal appendix. 6. 3 mm left lower lobe nodule, similar to previous. No follow-up needed if patient is low-risk. Non-contrast chest CT can be considered in 12 months if patient is high-risk. This recommendation follows the consensus statement: Guidelines for Management of Incidental Pulmonary Nodules Detected on CT Images: From the Fleischner Society 2017; Radiology 2017; 284:228-243. Electronically Signed   By: BJeannine BogaM.D.   On: 08/20/2021 01:37   MR 3D Recon At Scanner  Result Date: 08/30/2021 CLINICAL DATA:  Biliary ductal dilatation. Recent cholecystectomy, and ERCP with sphincterotomy for choledocholithiasis. EXAM: MRI ABDOMEN WITHOUT AND WITH CONTRAST (INCLUDING MRCP) TECHNIQUE: Multiplanar multisequence MR imaging of the abdomen was performed both before and after the administration of intravenous contrast. Heavily T2-weighted images of the biliary and pancreatic ducts were obtained, and  three-dimensional MRCP images were rendered by post processing. CONTRAST:  136mGADAVIST GADOBUTROL 1 MMOL/ML IV SOLN COMPARISON:  07/25/2021 FINDINGS: Lower chest:  No acute findings. Hepatobiliary: Hepatic cirrhosis again demonstrated. Two new cystic lesions with peripheral rim enhancement are seen in segment 2 of the left lobe measuring 1.4 cm on image 32/29, and in the caudate lobe measuring 1.4 cm on image 29/29, both suspicious for small hepatic abscesses. Prior cholecystectomy again noted. Chronic atrophy and intrahepatic biliary ductal dilatation is again seen throughout the medial right hepatic lobe and caudate lobe, however the degree of intrahepatic ductal dilatation has increased. New intrahepatic biliary ductal dilatation is seen throughout the left hepatic lobe. The common bile duct is nondilated, and there is a filling defect at the transition point in the common hepatic duct which measures approximately 12 mm (images 21/11 and image 18/15). This shows no definite contrast enhancement, favoring an intraductal calculus over an intraductal mass. In addition, other filling defects are seen within central right and left intrahepatic bile ducts, consistent with intraductal calculi. Pancreas: No mass or inflammatory changes. No evidence of pancreatic ductal dilatation. Spleen: Stable mild splenomegaly. No evidence of splenic mass or abscess. Adrenals/Urinary Tract: No masses identified. Bilateral renal parapelvic cysts incidentally noted. No evidence of hydronephrosis. Stomach/Bowel: Visualized portion unremarkable. Vascular/Lymphatic: No pathologically enlarged lymph nodes identified. No acute vascular findings. Other:  None. Musculoskeletal:  No suspicious bone lesions identified. : Increased intrahepatic biliary ductal dilatation throughout the medial right hepatic lobe and caudate lobe, and new intrahepatic ductal dilatation within the left lobe. Common bile duct remains nondilated. 12 mm filling defect  at transition point in the common hepatic duct, favoring an intraductal calculus over an intraductal mass. Other intraductal calculi are seen within the central right and left intrahepatic bile ducts. New small cystic lesions with peripheral rim enhancement in the left lobe and caudate lobe, suspicious for hepatic abscesses. Hepatic cirrhosis. Stable mild splenomegaly, consistent with portal venous hypertension. Electronically Signed   By: Marlaine Hind M.D.   On: 08/30/2021 11:25   DG ERCP  Result Date: 09/02/2021 CLINICAL DATA:  55 year old male with history of cholecystectomy and choledocholithiasis. EXAM: ERCP TECHNIQUE: Multiple spot images obtained with the fluoroscopic device and submitted for interpretation post-procedure. FLUOROSCOPY TIME:  Fluoroscopy Time:  7.24 minutes Radiation Exposure Index (if provided by the fluoroscopic device): 156.75 mGy Number of Acquired Spot Images: 9 COMPARISON:  08/29/2021, 08/22/2021 FINDINGS: Duodenal scope is positioned over the second portion the duodenum with retrograde cannulation of the common bile duct. The wire extends into the intrahepatic biliary tree. Retrograde cholangiogram demonstrates no evidence of significant intra or extrahepatic biliary ductal dilation. Plastic common bile duct stent is placed. IMPRESSION: Placement of plastic common bile duct stent. These images were submitted for radiologic interpretation only. Please see the procedural report for full procedural details as well as the amount of contrast and the fluoroscopy time utilized. Electronically Signed   By: Ruthann Cancer M.D.   On: 09/02/2021 11:07   DG ERCP  Result Date: 08/22/2021 CLINICAL DATA:  ERCP EXAM: ERCP TECHNIQUE: Multiple spot images obtained with the fluoroscopic device and submitted for interpretation post-procedure. FLUOROSCOPY TIME:  Refer to procedure report COMPARISON:  None. FINDINGS: A total of 18 fluoroscopic spot images are submitted for review taken during ERCP  procedure. Initial images demonstrate a scope overlying the upper abdomen. There is wire catheterization of the ampulla and common bile duct. Contrast injection of the common bile duct appears to demonstrate several small filling defects compatible with choledocholithiasis. The intrahepatic biliary ducts are nondilated. Balloon sweeps of the CBD is performed. Final injection of the common bile duct  demonstrates resolution of previously identified filling defects. IMPRESSION: Fluoroscopic images taken during ERCP as described. Refer to procedure report for full details. These images were submitted for radiologic interpretation only. Please see the procedural report for the amount of contrast and the fluoroscopy time utilized. Electronically Signed   By: Albin Felling M.D.   On: 08/22/2021 15:04   DG C-Arm 1-60 Min-No Report  Result Date: 09/02/2021 Fluoroscopy was utilized by the requesting physician.  No radiographic interpretation.   MR ABDOMEN MRCP W WO CONTAST  Result Date: 08/30/2021 CLINICAL DATA:  Biliary ductal dilatation. Recent cholecystectomy, and ERCP with sphincterotomy for choledocholithiasis. EXAM: MRI ABDOMEN WITHOUT AND WITH CONTRAST (INCLUDING MRCP) TECHNIQUE: Multiplanar multisequence MR imaging of the abdomen was performed both before and after the administration of intravenous contrast. Heavily T2-weighted images of the biliary and pancreatic ducts were obtained, and three-dimensional MRCP images were rendered by post processing. CONTRAST:  47m GADAVIST GADOBUTROL 1 MMOL/ML IV SOLN COMPARISON:  07/25/2021 FINDINGS: Lower chest: No acute findings. Hepatobiliary: Hepatic cirrhosis again demonstrated. Two new cystic lesions with peripheral rim enhancement are seen in segment 2 of the left lobe measuring 1.4 cm on image 32/29, and in the caudate lobe measuring 1.4 cm on image 29/29, both suspicious for small hepatic abscesses. Prior cholecystectomy again noted. Chronic atrophy and  intrahepatic biliary ductal dilatation is again seen throughout the medial right hepatic lobe and caudate lobe, however the degree of intrahepatic ductal dilatation has increased. New intrahepatic biliary ductal dilatation is seen throughout the left hepatic lobe. The common bile duct is nondilated, and there is a filling defect at the transition point in the common hepatic duct which measures approximately 12 mm (images 21/11 and image 18/15). This shows no definite contrast enhancement, favoring an intraductal calculus over an intraductal mass. In addition, other filling defects are seen within central right and left intrahepatic bile ducts, consistent with intraductal calculi. Pancreas: No mass or inflammatory changes. No evidence of pancreatic ductal dilatation. Spleen: Stable mild splenomegaly. No evidence of splenic mass or abscess. Adrenals/Urinary Tract: No masses identified. Bilateral renal parapelvic cysts incidentally noted. No evidence of hydronephrosis. Stomach/Bowel: Visualized portion unremarkable. Vascular/Lymphatic: No pathologically enlarged lymph nodes identified. No acute vascular findings. Other:  None. Musculoskeletal:  No suspicious bone lesions identified. : Increased intrahepatic biliary ductal dilatation throughout the medial right hepatic lobe and caudate lobe, and new intrahepatic ductal dilatation within the left lobe. Common bile duct remains nondilated. 12 mm filling defect at transition point in the common hepatic duct, favoring an intraductal calculus over an intraductal mass. Other intraductal calculi are seen within the central right and left intrahepatic bile ducts. New small cystic lesions with peripheral rim enhancement in the left lobe and caudate lobe, suspicious for hepatic abscesses. Hepatic cirrhosis. Stable mild splenomegaly, consistent with portal venous hypertension. Electronically Signed   By: JMarlaine HindM.D.   On: 08/30/2021 11:25   [place] SITE RITE  Result Date:  09/03/2021 If Site Rite image not attached, placement could not be confirmed due to current cardiac rhythm.   Microbiology: Recent Results (from the past 240 hour(s))  Resp Panel by RT-PCR (Flu A&B, Covid) Nasopharyngeal Swab     Status: None   Collection Time: 08/30/21  9:05 PM   Specimen: Nasopharyngeal Swab; Nasopharyngeal(NP) swabs in vial transport medium  Result Value Ref Range Status   SARS Coronavirus 2 by RT PCR NEGATIVE NEGATIVE Final    Comment: (NOTE) SARS-CoV-2 target nucleic acids are NOT DETECTED.  The SARS-CoV-2  RNA is generally detectable in upper respiratory specimens during the acute phase of infection. The lowest concentration of SARS-CoV-2 viral copies this assay can detect is 138 copies/mL. A negative result does not preclude SARS-Cov-2 infection and should not be used as the sole basis for treatment or other patient management decisions. A negative result may occur with  improper specimen collection/handling, submission of specimen other than nasopharyngeal swab, presence of viral mutation(s) within the areas targeted by this assay, and inadequate number of viral copies(<138 copies/mL). A negative result must be combined with clinical observations, patient history, and epidemiological information. The expected result is Negative.  Fact Sheet for Patients:  EntrepreneurPulse.com.au  Fact Sheet for Healthcare Providers:  IncredibleEmployment.be  This test is no t yet approved or cleared by the [place] FDA and  has been authorized for detection and/or diagnosis of SARS-CoV-2 by FDA under an Emergency Use Authorization (EUA). This EUA will remain  in effect (meaning this test can be used) for the duration of the COVID-19 declaration under Section 564(b)(1) of the Act, 21 U.S.C.section 360bbb-3(b)(1), unless the authorization is terminated  or revoked sooner.       Influenza A by PCR NEGATIVE NEGATIVE Final    Influenza B by PCR NEGATIVE NEGATIVE Final    Comment: (NOTE) The Xpert Xpress SARS-CoV-2/FLU/RSV plus assay is intended as an aid in the diagnosis of influenza from Nasopharyngeal swab specimens and should not be used as a sole basis for treatment. Nasal washings and aspirates are unacceptable for Xpert Xpress SARS-CoV-2/FLU/RSV testing.  Fact Sheet for Patients: EntrepreneurPulse.com.au  Fact Sheet for Healthcare Providers: IncredibleEmployment.be  This test is not yet approved or cleared by the [place] FDA and has been authorized for detection and/or diagnosis of SARS-CoV-2 by FDA under an Emergency Use Authorization (EUA). This EUA will remain in effect (meaning this test can be used) for the duration of the COVID-19 declaration under Section 564(b)(1) of the Act, 21 U.S.C. section 360bbb-3(b)(1), unless the authorization is terminated or revoked.  Performed at [facility], [place] [address], [place], [place] [address]   Culture, blood (routine x 2)     Status: None   Collection Time: 08/30/21 11:06 PM   Specimen: BLOOD  Result Value Ref Range Status   Specimen Description BLOOD LEFT ANTECUBITAL  Final   Special Requests   Final    BOTTLES DRAWN AEROBIC AND ANAEROBIC Blood Culture adequate volume   Culture   Final    NO GROWTH 5 DAYS Performed at [facility], [place] [address] [place], [place] [address]    Report Status 09/04/2021 FINAL  Final  Culture, blood (routine x 2)     Status: None   Collection Time: 08/30/21 11:12 PM   Specimen: BLOOD LEFT HAND  Result Value Ref Range Status   Specimen Description BLOOD LEFT HAND  Final   Special Requests   Final    BOTTLES DRAWN AEROBIC AND ANAEROBIC Blood Culture adequate volume   Culture   Final    NO GROWTH 5 DAYS Performed at [facility], [place] [address], [place], [place] [address]    Report Status 09/04/2021 FINAL  Final  Surgical PCR screen     Status: None    Collection Time: 08/31/21  4:49 AM   Specimen: Nasal Mucosa; Nasal Swab  Result Value Ref Range Status   MRSA, PCR NEGATIVE NEGATIVE Final   Staphylococcus aureus NEGATIVE NEGATIVE Final    Comment: (NOTE) The Xpert SA Assay (  FDA approved for NASAL specimens in patients 20 years of age and older), is one component of a comprehensive surveillance program. It is not intended to diagnose infection nor to guide or monitor treatment. Performed at [facility], [place] [address], [place], [place] [address]      Labs: Basic Metabolic Panel: Recent Labs  Lab 08/31/21 0225 09/01/21 0228 09/02/21 0224 09/03/21 0433 09/04/21 0337  NA 134* 132* 130* 131* 130*  K 3.7 3.6 4.3 4.6 3.9  CL 98 97* 94* 95* 95*  CO2 '23 24 24 22 28  ' GLUCOSE 48* 126* 119* 182* 247*  BUN 5* '6 11 15 15  ' CREATININE 0.76 0.84 0.97 0.86 0.66  CALCIUM 8.4* 8.9 8.8* 8.9 8.3*   Liver Function Tests: Recent Labs  Lab 08/31/21 0225 09/01/21 0228 09/02/21 0224 09/03/21 0433 09/04/21 0337  AST 101* 82* 99* 54* 42*  ALT 98* 83* 82* 68* 55*  ALKPHOS 662* 667* 705* 688* 647*  BILITOT 6.3* 8.4* 7.4* 4.4* 2.7*  PROT 6.2* 6.5 6.6 6.3* 5.6*  ALBUMIN 2.3* 2.1* 2.0* 1.9* 1.9*   No results for input(s): LIPASE, AMYLASE in the last 168 hours. No results for input(s): AMMONIA in the last 168 hours. CBC: Recent Labs  Lab 08/31/21 0225 09/01/21 0228 09/02/21 0224 09/03/21 0433 09/04/21 0337  WBC 11.0* 11.1* 12.4* 8.8 8.3  NEUTROABS 8.8*  --   --   --   --   HGB 10.3* 10.6* 10.5* 10.1* 9.2*  HCT 31.5* 31.7* 32.5* 31.2* 29.3*  MCV 81.4 79.3* 80.2 80.4 82.8  PLT 296 280 368 344 331   Cardiac Enzymes: No results for input(s): CKTOTAL, CKMB, CKMBINDEX, TROPONINI in the last 168 hours. BNP: BNP (last 3 results) No results for input(s): BNP in the last 8760 hours.  ProBNP (last 3 results) No results for input(s): PROBNP in the last 8760 hours.  CBG: Recent Labs  Lab 09/03/21 1216 09/03/21 1708  09/03/21 2014 09/04/21 0658 09/04/21 0754  GLUCAP 227* 227* 264* 243* 277*       Signed:  Domenic Polite MD.  [facility] 09/04/2021, 10:05 AM

## 2021-09-04 NOTE — Plan of Care (Signed)
°  Problem: Education: Goal: Knowledge of General Education information will improve Description: Including pain rating scale, medication(s)/side effects and non-pharmacologic comfort measures Outcome: Adequate for Discharge   Problem: Health Behavior/Discharge Planning: Goal: Ability to manage health-related needs will improve Outcome: Adequate for Discharge   Problem: Clinical Measurements: Goal: Ability to maintain clinical measurements within normal limits will improve Outcome: Adequate for Discharge Goal: Will remain free from infection Outcome: Adequate for Discharge Goal: Diagnostic test results will improve Outcome: Adequate for Discharge   Problem: Clinical Measurements: Goal: Will remain free from infection Outcome: Adequate for Discharge   Problem: Clinical Measurements: Goal: Diagnostic test results will improve Outcome: Adequate for Discharge   Problem: Activity: Goal: Risk for activity intolerance will decrease Outcome: Adequate for Discharge   Problem: Nutrition: Goal: Adequate nutrition will be maintained Outcome: Adequate for Discharge   Problem: Elimination: Goal: Will not experience complications related to bowel motility Outcome: Adequate for Discharge Goal: Will not experience complications related to urinary retention Outcome: Adequate for Discharge   Problem: Pain Managment: Goal: General experience of comfort will improve Outcome: Adequate for Discharge   Problem: Safety: Goal: Ability to remain free from injury will improve Outcome: Adequate for Discharge   Problem: Skin Integrity: Goal: Risk for impaired skin integrity will decrease Outcome: Adequate for Discharge

## 2021-09-04 NOTE — Plan of Care (Signed)

## 2021-09-04 NOTE — Progress Notes (Signed)
Omar Travis 9:48 AM  Subjective: Patient doing well tolerating diet not having abdominal pain Case discussed again with his wife and the primary team and he has some questions about his sugar with which we discussed and we again reviewed the warnings of when to call and our outpatient plan if things go well and answered all of their questions  Objective: Vital signs stable afebrile no acute distress abdomen is soft nontender liver tests decreasing  Assessment: Difficult to remove CBD stones complicated with recurrent cholangitis  Plan: If he goes home we will repeat labs as an outpatient next week and they will call as needed otherwise based on labs will decide follow-up and antibiotics per ID team and again the warnings of what to look for as an outpatient was discussed  [facility]  office [phone] After 5PM or if no answer call [phone]

## 2021-09-05 ENCOUNTER — Ambulatory Visit (INDEPENDENT_AMBULATORY_CARE_PROVIDER_SITE_OTHER): Payer: BC Managed Care – PPO | Admitting: Infectious Disease

## 2021-09-05 ENCOUNTER — Other Ambulatory Visit: Payer: Self-pay

## 2021-09-05 ENCOUNTER — Ambulatory Visit (INDEPENDENT_AMBULATORY_CARE_PROVIDER_SITE_OTHER): Payer: BC Managed Care – PPO

## 2021-09-05 ENCOUNTER — Encounter: Payer: Self-pay | Admitting: Infectious Disease

## 2021-09-05 ENCOUNTER — Other Ambulatory Visit: Payer: Self-pay | Admitting: Gastroenterology

## 2021-09-05 VITALS — BP 115/77 | HR 99 | Temp 97.9°F | Ht 72.0 in | Wt 211.0 lb

## 2021-09-05 DIAGNOSIS — R509 Fever, unspecified: Secondary | ICD-10-CM | POA: Diagnosis not present

## 2021-09-05 DIAGNOSIS — Z23 Encounter for immunization: Secondary | ICD-10-CM | POA: Diagnosis not present

## 2021-09-05 DIAGNOSIS — K8309 Other cholangitis: Secondary | ICD-10-CM

## 2021-09-05 DIAGNOSIS — K750 Abscess of liver: Secondary | ICD-10-CM

## 2021-09-05 DIAGNOSIS — K7581 Nonalcoholic steatohepatitis (NASH): Secondary | ICD-10-CM

## 2021-09-05 DIAGNOSIS — C801 Malignant (primary) neoplasm, unspecified: Secondary | ICD-10-CM

## 2021-09-05 DIAGNOSIS — K831 Obstruction of bile duct: Secondary | ICD-10-CM | POA: Insufficient documentation

## 2021-09-05 DIAGNOSIS — Z7185 Encounter for immunization safety counseling: Secondary | ICD-10-CM

## 2021-09-05 DIAGNOSIS — K8050 Calculus of bile duct without cholangitis or cholecystitis without obstruction: Secondary | ICD-10-CM

## 2021-09-05 HISTORY — DX: Malignant (primary) neoplasm, unspecified: C80.1

## 2021-09-05 HISTORY — DX: Obstruction of bile duct: K83.1

## 2021-09-05 HISTORY — DX: Encounter for immunization safety counseling: Z71.85

## 2021-09-05 HISTORY — DX: Nonalcoholic steatohepatitis (NASH): K75.81

## 2021-09-05 NOTE — Progress Notes (Signed)
Subjective:  Chief complaint: Still with some fatigue and residual jaundice   patient ID: Omar Travis, male    DOB: 30-Dec-1965, 55 y.o.   MRN: [id number]  HPI  Omar Travis is a 55 year old Caucasian man who underwent laparoscopic cholecystectomy and pancreatic stenting who then developed postoperative fevers.  He had blood cultures that were negative CT and MRCP that were unremarkable at the time.    It seemed reasonable to assume that the source of his fevers was the biliary tree given his recent surgery with cholecystectomy and stenting of the pancreas.  However as mentioned imaging at the time was unremarkable.  We did also however performed extensive laboratory evaluation of the patient in the hospital for both infectious and noninfectious causes of fevers and these were all unremarkable.  He did however have markedly elevated inflammatory markers suggested that he had a significant element Tory process in place.  He has been seen by my partner Dr. Juleen China twice and was seen in the ER where he had labs concerning for choledocholithiasis with cholecystitis.   Patient was seen by Dr. Watt Climes and had repeat ERCP on December 1.  In the interim he was placed on Augmentin which he continued to present date.  On Friday the day after his ERCP during which multiple stones were removed he was feeling much more like himself again but then in the evening on Friday he began having chills epigastric pain and a temperature using a temporal thermometer which was 101.5.   He continued to suffer from subjective and objective fevers on a nearly daily basis despite Augmentin and despite the recent ERCP.  His only other focal signs were some epigastric pain and sometimes generalized abdominal pain.  He was seen by me earlier this week and I obtained labs that showed worsening of obstructive hepatitis type pattern of liver function tests I discussed with Dr. Watt Climes who had recommended MRCP.  I also change his  antibiotics to cefdinir and metronidazole.  He is continued having fevers despite being on these antibiotics.  MRCP today 08/29/2021  Increased intrahepatic biliary ductal dilatation throughout the medial right hepatic lobe caudate lobe and new intrahepatic ductal dilatation with a left lobe with nondilated common bile bile duct.  There is a 12 mm filling defect at the transition point of the common hepatic duct thought to be intraductal calculus though could be an intraductal mass.  In addition he was found to have new cystic lesions consistent with possible abscesses.  He continued to have subjective fevers and had a fever in clinic at the end of his visit with [place] last time I saw him.  I had admitted to the hospital to the hospitalist service and he was seen by GI and my partner Dr. Juleen China. Liver abscesses were not amenable to drainage.  Patient underwent ERCP on December 12 Dr. Watt Climes with removal of stone and placement of biliary stent due to finding of a stricture in the main bile duct with biopsy and cytology sent.  Original pathology comes back negative for malignancy though cytology read as being "suspicious for malignancy.   He is tolerating his ceftriaxone metronidazole well and otherwise feels well he is not having abdominal pain and no fevers.          Past Medical History:  Diagnosis Date   Diabetes ([place])    GERD (gastroesophageal reflux disease)    HTN (hypertension)    Hyperlipidemia    Liver abscess 08/30/2021    Past Surgical  History:  Procedure Laterality Date   BILIARY BRUSHING  09/02/2021   Procedure: BILIARY BRUSHING;  Surgeon: Clarene Essex, MD;  Location: [facility] ENDOSCOPY;  Service: Endoscopy;;   BILIARY STENT PLACEMENT  09/02/2021   Procedure: BILIARY STENT PLACEMENT;  Surgeon: Clarene Essex, MD;  Location: [facility] ENDOSCOPY;  Service: Endoscopy;;   BIOPSY  09/02/2021   Procedure: BIOPSY;  Surgeon: Clarene Essex, MD;  Location: [facility] ENDOSCOPY;  Service: Endoscopy;;    CHOLECYSTECTOMY N/A 07/17/2021   Procedure: LAPAROSCOPIC CHOLECYSTECTOMY;  Surgeon: Clovis Riley, MD;  Location: [place];  Service: General;  Laterality: N/A;   ENDOSCOPIC RETROGRADE CHOLANGIOPANCREATOGRAPHY (ERCP) WITH PROPOFOL N/A 07/16/2021   Procedure: ENDOSCOPIC RETROGRADE CHOLANGIOPANCREATOGRAPHY (ERCP) WITH PROPOFOL;  Surgeon: Clarene Essex, MD;  Location: [place];  Service: Endoscopy;  Laterality: N/A;   ERCP N/A 08/22/2021   Procedure: ENDOSCOPIC RETROGRADE CHOLANGIOPANCREATOGRAPHY (ERCP);  Surgeon: Clarene Essex, MD;  Location: [facility] ENDOSCOPY;  Service: Endoscopy;  Laterality: N/A;   ERCP N/A 09/02/2021   Procedure: ENDOSCOPIC RETROGRADE CHOLANGIOPANCREATOGRAPHY (ERCP);  Surgeon: Clarene Essex, MD;  Location: [place];  Service: Endoscopy;  Laterality: N/A;   PANCREATIC STENT PLACEMENT  07/16/2021   Procedure: PANCREATIC STENT PLACEMENT;  Surgeon: Clarene Essex, MD;  Location: [place];  Service: Endoscopy;;   REMOVAL OF STONES  07/16/2021   Procedure: REMOVAL OF STONES;  Surgeon: Clarene Essex, MD;  Location[facility] ENDOSCOPY;  Service: Endoscopy;;   REMOVAL OF STONES  08/22/2021   Procedure: REMOVAL OF STONES;  Surgeon: Clarene Essex, MD;  Location: [facility] ENDOSCOPY;  Service: Endoscopy;;   REMOVAL OF STONES  09/02/2021   Procedure: REMOVAL OF STONES;  Surgeon: Clarene Essex, MD;  Location: [facility] ENDOSCOPY;  Service: Endoscopy;;   SPHINCTEROTOMY  07/16/2021   Procedure: Joan Mayans;  Surgeon: Clarene Essex, MD;  Location[facility] ENDOSCOPY;  Service: Endoscopy;;   SPHINCTEROTOMY  08/22/2021   Procedure: Joan Mayans;  Surgeon: Clarene Essex, MD;  Location: [facility] ENDOSCOPY;  Service: Endoscopy;;   SPYGLASS CHOLANGIOSCOPY N/A 09/02/2021   Procedure: BDZHGDJM CHOLANGIOSCOPY;  Surgeon: Clarene Essex, MD;  Location: [place];  Service: Endoscopy;  Laterality: N/A;   SPYGLASS LITHOTRIPSY N/A 09/02/2021   Procedure: EQASTMHD LITHOTRIPSY;  Surgeon: Clarene Essex, MD;  Location: [place];  Service:  Endoscopy;  Laterality: N/A;    Family History  Problem Relation Age of Onset   Diabetes Mother    Liver cancer Father    Diabetes Father       Social History   Socioeconomic History   Marital status: Married    Spouse name: Not on file   Number of children: Not on file   Years of education: Not on file   Highest education level: Not on file  Occupational History   Not on file  Tobacco Use   Smoking status: Never   Smokeless tobacco: Never  Substance and Sexual Activity   Alcohol use: Never    Comment: Social drinker only on occasion   Drug use: Never   Sexual activity: Yes  Other Topics Concern   Not on file  Social History Narrative   Not on file   Social Determinants of Health   Financial Resource Strain: Not on file  Food Insecurity: Not on file  Transportation Needs: Not on file  Physical Activity: Not on file  Stress: Not on file  Social Connections: Not on file    Allergies  Allergen Reactions   Bee Venom Hives and Swelling   Benadryl Allergy [Diphenhydramine Hcl] Rash   Flexeril [Cyclobenzaprine] Rash  Current Outpatient Medications:    canagliflozin (INVOKANA) 300 MG TABS tablet, Take 300 mg by mouth daily before breakfast., Disp: , Rfl:    cefTRIAXone (ROCEPHIN) IVPB, Inject 2 g into the vein daily. Indication:  liver abscess First Dose: No Last Day of Therapy:  09/29/2021 Labs - Once weekly:  CBC/D and BMP, Labs - Every other week:  ESR and CRP Method of administration: IV Push Method of administration may be changed at the discretion of home infusion pharmacist based upon assessment of the patient and/or caregiver's ability to self-administer the medication ordered., Disp: 27 Units, Rfl: 0   Continuous Blood Gluc Sensor (DEXCOM G6 SENSOR) MISC, Change sensor every 10 days, Disp: , Rfl:    escitalopram (LEXAPRO) 10 MG tablet, Take 1 tablet (10 mg total) by mouth daily., Disp: 30 tablet, Rfl: 4   fenofibrate (TRICOR) 145 MG tablet, Take 145 mg by  mouth daily., Disp: , Rfl:    insulin glargine, 1 Unit Dial, (TOUJEO SOLOSTAR) 300 UNIT/ML Solostar Pen, Inject 20 Units into the skin daily. (Patient taking differently: Inject 30 Units into the skin at bedtime.), Disp: , Rfl:    lisinopril (ZESTRIL) 5 MG tablet, TAKE ONE (1) TABLET EACH DAY (Patient taking differently: Take 5 mg by mouth daily.), Disp: 90 tablet, Rfl: 1   metroNIDAZOLE (FLAGYL) 500 MG tablet, Take 1 tablet (500 mg total) by mouth every 12 (twelve) hours for 27 days., Disp: 54 tablet, Rfl: 0   omeprazole (PRILOSEC) 20 MG capsule, Take 1 capsule (20 mg total) by mouth daily., Disp: 90 capsule, Rfl: 3   ondansetron (ZOFRAN-ODT) 8 MG disintegrating tablet, Take 8 mg by mouth every 8 (eight) hours as needed for nausea., Disp: , Rfl:    OZEMPIC, 1 MG/DOSE, 4 MG/3ML SOPN, Inject 1 mg into the skin every Monday., Disp: , Rfl:    rosuvastatin (CRESTOR) 10 MG tablet, Take 10 mg by mouth at bedtime., Disp: , Rfl:    BD PEN NEEDLE NANO U/F 32G X 4 MM MISC, 1 each by Other route 2 (two) times daily., Disp: , Rfl:    Continuous Blood Gluc Transmit (DEXCOM G6 TRANSMITTER) MISC, Use as directed, Disp: , Rfl:    ONETOUCH VERIO test strip, 1 each by Other route 2 (two) times daily., Disp: , Rfl:    oxyCODONE (OXY IR/ROXICODONE) 5 MG immediate release tablet, Take 1 tablet (5 mg total) by mouth every 6 (six) hours as needed for moderate pain or severe pain (not relieved by tylenol or ibuprofen.). (Patient not taking: Reported on 08/21/2021), Disp: 15 tablet, Rfl: 0   Review of Systems  Constitutional:  Positive for fatigue. Negative for activity change, appetite change, chills, diaphoresis, fever and unexpected weight change.  HENT:  Negative for congestion, rhinorrhea, sinus pressure, sneezing, sore throat and trouble swallowing.   Eyes:  Negative for photophobia and visual disturbance.  Respiratory:  Negative for cough, chest tightness, shortness of breath, wheezing and stridor.    Cardiovascular:  Negative for chest pain, palpitations and leg swelling.  Gastrointestinal:  Negative for abdominal distention, abdominal pain, anal bleeding, blood in stool, constipation, diarrhea, nausea and vomiting.  Genitourinary:  Negative for difficulty urinating, dysuria, flank pain and hematuria.  Musculoskeletal:  Negative for arthralgias, back pain, gait problem, joint swelling and myalgias.  Skin:  Negative for color change, pallor, rash and wound.  Neurological:  Negative for dizziness, tremors, weakness and light-headedness.  Hematological:  Negative for adenopathy. Does not bruise/bleed easily.  Psychiatric/Behavioral:  Negative for agitation,  behavioral problems, confusion, decreased concentration, dysphoric mood and sleep disturbance.       Objective:   Physical Exam Constitutional:      Appearance: He is well-developed.  HENT:     Head: Normocephalic and atraumatic.  Eyes:     Extraocular Movements: Extraocular movements intact.     Conjunctiva/sclera: Conjunctivae normal.  Cardiovascular:     Rate and Rhythm: Normal rate and regular rhythm.  Pulmonary:     Effort: Pulmonary effort is normal. No respiratory distress.     Breath sounds: No wheezing.  Abdominal:     General: There is no distension.     Palpations: Abdomen is soft.  Musculoskeletal:        General: No tenderness. Normal range of motion.     Cervical back: Normal range of motion and neck supple.  Skin:    General: Skin is warm and dry.     Coloration: Skin is jaundiced. Skin is not pale.     Findings: No erythema or rash.  Neurological:     General: No focal deficit present.     Mental Status: He is alert and oriented to person, place, and time.  Psychiatric:        Mood and Affect: Mood normal.        Behavior: Behavior normal.        Thought Content: Thought content normal.        Judgment: Judgment normal.          Assessment & Plan:   #1 Recurrent cholangitis with liver  abscesses:  Recurrent cholangitis seems to be due to a stricture plus according to cytology report malignancy  He is now status post ERCP as mentioned with stent placed.  He has put follow-up with Dr. Watt Climes on Monday.  In the interim we will continue with ceftriaxone and metronidazole to treat his liver abscesses.  He is 1 to be treated through September 29, 2020.  He may need further protracted oral therapy  1 would also consider repeat imaging though the abscesses were initially only picked up on MRI and not on CT.  I have scheduled him with Dr. Juleen China in January prior to his IV antibiotics ending.  #2 biliary malignancy:  I went over the pathology results including cytology with the patient and his wife today.  They seem to be coping with this possible diagnosis as best they can.  They have follow-up as mentioned on Monday with Dr. Watt Climes.  #3 NASH with cirrhosis: following with Dr. Watt Climes  #4 Vaccine counseling: Amended to get updated COVID-19 booster which he received today in clinic  I spent 42 minutes with the patient including than 50% of the time in face to face counseling of the patient guarding his liver abscesses his recurrent cholangitis with stones and a stricture and now apparent malignancy based on cytology report, personally MRCP CT scans updated CMP and CBC surgical pathology and cytology reviewing along with review of medical records in preparation for the visit and during the visit and in coordination of his care.

## 2021-09-05 NOTE — Progress Notes (Signed)
° °  Covid-19 Vaccination Clinic  Name:  SHAWN CARATTINI    MRN: [id number] DOB: Mar 19, 1966  09/05/2021  Mr. Pino was observed post Covid-19 immunization for 15 minutes without incident. He was provided with Vaccine Information Sheet and instruction to access the V-Safe system.   Mr. Lacek was instructed to call 911 with any severe reactions post vaccine: Difficulty breathing  Swelling of face and throat  A fast heartbeat  A bad rash all over body  Dizziness and weakness   Immunizations Administered     Name Date Dose VIS Date Route   Pfizer Covid-19 Vaccine Bivalent Booster 09/05/2021 10:15 AM 0.3 mL 05/22/2021 Intramuscular   Manufacturer: [employer]   Lot: [id number]   [place]: [place], RN

## 2021-09-06 DIAGNOSIS — K750 Abscess of liver: Secondary | ICD-10-CM | POA: Diagnosis not present

## 2021-09-07 DIAGNOSIS — K750 Abscess of liver: Secondary | ICD-10-CM | POA: Diagnosis not present

## 2021-09-08 DIAGNOSIS — K750 Abscess of liver: Secondary | ICD-10-CM | POA: Diagnosis not present

## 2021-09-09 DIAGNOSIS — R7989 Other specified abnormal findings of blood chemistry: Secondary | ICD-10-CM | POA: Diagnosis not present

## 2021-09-09 DIAGNOSIS — R945 Abnormal results of liver function studies: Secondary | ICD-10-CM | POA: Diagnosis not present

## 2021-09-09 DIAGNOSIS — R799 Abnormal finding of blood chemistry, unspecified: Secondary | ICD-10-CM | POA: Diagnosis not present

## 2021-09-09 DIAGNOSIS — K750 Abscess of liver: Secondary | ICD-10-CM | POA: Diagnosis not present

## 2021-09-09 DIAGNOSIS — K7460 Unspecified cirrhosis of liver: Secondary | ICD-10-CM | POA: Diagnosis not present

## 2021-09-10 DIAGNOSIS — K750 Abscess of liver: Secondary | ICD-10-CM | POA: Diagnosis not present

## 2021-09-11 DIAGNOSIS — K750 Abscess of liver: Secondary | ICD-10-CM | POA: Diagnosis not present

## 2021-09-12 DIAGNOSIS — K750 Abscess of liver: Secondary | ICD-10-CM | POA: Diagnosis not present

## 2021-09-13 DIAGNOSIS — K750 Abscess of liver: Secondary | ICD-10-CM | POA: Diagnosis not present

## 2021-09-14 DIAGNOSIS — K750 Abscess of liver: Secondary | ICD-10-CM | POA: Diagnosis not present

## 2021-09-15 DIAGNOSIS — K750 Abscess of liver: Secondary | ICD-10-CM | POA: Diagnosis not present

## 2021-09-16 DIAGNOSIS — K750 Abscess of liver: Secondary | ICD-10-CM | POA: Diagnosis not present

## 2021-09-17 DIAGNOSIS — K750 Abscess of liver: Secondary | ICD-10-CM | POA: Diagnosis not present

## 2021-09-18 ENCOUNTER — Encounter: Payer: Self-pay | Admitting: Infectious Disease

## 2021-09-18 DIAGNOSIS — K750 Abscess of liver: Secondary | ICD-10-CM | POA: Diagnosis not present

## 2021-09-19 ENCOUNTER — Encounter (HOSPITAL_COMMUNITY): Payer: Self-pay | Admitting: Gastroenterology

## 2021-09-19 DIAGNOSIS — K750 Abscess of liver: Secondary | ICD-10-CM | POA: Diagnosis not present

## 2021-09-20 DIAGNOSIS — K750 Abscess of liver: Secondary | ICD-10-CM | POA: Diagnosis not present

## 2021-09-21 DIAGNOSIS — K750 Abscess of liver: Secondary | ICD-10-CM | POA: Diagnosis not present

## 2021-09-22 DIAGNOSIS — K750 Abscess of liver: Secondary | ICD-10-CM | POA: Diagnosis not present

## 2021-09-23 DIAGNOSIS — K750 Abscess of liver: Secondary | ICD-10-CM | POA: Diagnosis not present

## 2021-09-24 ENCOUNTER — Encounter: Payer: Self-pay | Admitting: Infectious Diseases

## 2021-09-24 ENCOUNTER — Encounter: Payer: Self-pay | Admitting: Internal Medicine

## 2021-09-24 ENCOUNTER — Ambulatory Visit: Payer: BC Managed Care – PPO | Admitting: Internal Medicine

## 2021-09-24 DIAGNOSIS — K750 Abscess of liver: Secondary | ICD-10-CM | POA: Diagnosis not present

## 2021-09-25 ENCOUNTER — Encounter: Payer: Self-pay | Admitting: Infectious Disease

## 2021-09-25 DIAGNOSIS — K750 Abscess of liver: Secondary | ICD-10-CM | POA: Diagnosis not present

## 2021-09-25 NOTE — Telephone Encounter (Signed)
It actually looks like Dr. West Bali wrote a letter yesterday for him. I will see the pt can print from home or if they can pick it up at the office.

## 2021-09-26 ENCOUNTER — Encounter: Payer: Self-pay | Admitting: Infectious Diseases

## 2021-09-26 ENCOUNTER — Other Ambulatory Visit: Payer: Self-pay | Admitting: Infectious Diseases

## 2021-09-26 ENCOUNTER — Other Ambulatory Visit: Payer: Self-pay

## 2021-09-26 ENCOUNTER — Ambulatory Visit: Payer: BC Managed Care – PPO | Admitting: Infectious Diseases

## 2021-09-26 VITALS — BP 101/69 | HR 102 | Temp 98.2°F | Wt 213.0 lb

## 2021-09-26 DIAGNOSIS — Z0289 Encounter for other administrative examinations: Secondary | ICD-10-CM

## 2021-09-26 DIAGNOSIS — Z452 Encounter for adjustment and management of vascular access device: Secondary | ICD-10-CM

## 2021-09-26 DIAGNOSIS — K8309 Other cholangitis: Secondary | ICD-10-CM

## 2021-09-26 DIAGNOSIS — K750 Abscess of liver: Secondary | ICD-10-CM

## 2021-09-26 DIAGNOSIS — K7460 Unspecified cirrhosis of liver: Secondary | ICD-10-CM | POA: Diagnosis not present

## 2021-09-26 DIAGNOSIS — Z7185 Encounter for immunization safety counseling: Secondary | ICD-10-CM

## 2021-09-26 DIAGNOSIS — Z5181 Encounter for therapeutic drug level monitoring: Secondary | ICD-10-CM

## 2021-09-26 NOTE — Progress Notes (Addendum)
Patient Active Problem List   Diagnosis Date Noted   Biliary obstruction due to malignant neoplasm ([place]) 09/05/2021   NASH (nonalcoholic steatohepatitis) 09/05/2021   Vaccine counseling 09/05/2021   Liver abscess 08/30/2021   Generalized weakness    Pancytopenia ([place]) 07/24/2021   Febrile illness, acute 07/22/2021   Cholangitis 07/14/2021   Choledocholithiasis    Depression, recurrent ([place]) 11/24/2019   Gastroesophageal reflux disease 06/22/2019   Hyperlipidemia 06/22/2019   Diabetes mellitus ([place])    Essential hypertension     Patient's Medications  New Prescriptions   No medications on file  Previous Medications   BD PEN NEEDLE NANO U/F 32G X 4 MM MISC    1 each by Other route 2 (two) times daily.   CANAGLIFLOZIN (INVOKANA) 300 MG TABS TABLET    Take 300 mg by mouth daily before breakfast.   CEFTRIAXONE (ROCEPHIN) IVPB    Inject 2 g into the vein daily. Indication:  liver abscess First Dose: No Last Day of Therapy:  09/29/2021 Labs - Once weekly:  CBC/D and BMP, Labs - Every other week:  ESR and CRP Method of administration: IV Push Method of administration may be changed at the discretion of home infusion pharmacist based upon assessment of the patient and/or caregiver's ability to self-administer the medication ordered.   CONTINUOUS BLOOD GLUC SENSOR (DEXCOM G6 SENSOR) MISC    Change sensor every 10 days   CONTINUOUS BLOOD GLUC TRANSMIT (DEXCOM G6 TRANSMITTER) MISC    Use as directed   ESCITALOPRAM (LEXAPRO) 10 MG TABLET    Take 1 tablet (10 mg total) by mouth daily.   FENOFIBRATE (TRICOR) 145 MG TABLET    Take 145 mg by mouth daily.   INSULIN GLARGINE, 1 UNIT DIAL, (TOUJEO SOLOSTAR) 300 UNIT/ML SOLOSTAR PEN    Inject 20 Units into the skin daily.   LISINOPRIL (ZESTRIL) 5 MG TABLET    TAKE ONE (1) TABLET EACH DAY   METRONIDAZOLE (FLAGYL) 500 MG TABLET    Take 1 tablet (500 mg total) by mouth every 12 (twelve) hours for 27 days.   OMEPRAZOLE (PRILOSEC) 20 MG CAPSULE     Take 1 capsule (20 mg total) by mouth daily.   ONDANSETRON (ZOFRAN-ODT) 8 MG DISINTEGRATING TABLET    Take 8 mg by mouth every 8 (eight) hours as needed for nausea.   ONETOUCH VERIO TEST STRIP    1 each by Other route 2 (two) times daily.   OXYCODONE (OXY IR/ROXICODONE) 5 MG IMMEDIATE RELEASE TABLET    Take 1 tablet (5 mg total) by mouth every 6 (six) hours as needed for moderate pain or severe pain (not relieved by tylenol or ibuprofen.).   OZEMPIC, 1 MG/DOSE, 4 MG/3ML SOPN    Inject 1 mg into the skin every Monday.   ROSUVASTATIN (CRESTOR) 10 MG TABLET    Take 10 mg by mouth at bedtime.  Modified Medications   No medications on file  Discontinued Medications   No medications on file    Subjective: 56 Y O male with a PMH of Chronic intrahepatic biliary dilatation, HTN, DM, GERD, HLD, NASH/Cirrhosis who is here for follow up in the setting of cholangitis/Liver abscesses and possible biliary malignancy. Patient previously followed by my partners Dr Tommy Medal and Dr Juleen China.   Patient was previously seen at [facility] and was told to have vascular insult. Or inflammation per MRCP done in 10/2017.  He was then referred to [facility] in 2019 where  repeat MRCP 11/2017 showed severe segmental intrahepatic biliary ductal dilatation involving hepatic segments 6, unchanged dating back to 2015. Given stability of findings are favored to be related to chronic stricture and possibly related to aberrant rt posterior ductal anatomy. Wife says he was having on and off abdominal pain since that time and was also told to have IBS at one point however, he did not continue to follow up and finally ended up admitting in the hospital in Oct 2022 with jaundice and abdominal pain.   He has smoked at teenage, social drinker for last 25 years. Denies any illicit drugs. Denies h/o liver disease in the family   Previous hospital admissions: 10/23-10/26 Admitted with right upper quadrant abdominal pain, jaundice.  At ED febrile,  abnormal LFT, CT abdomen pelvis concerning for acute on chronic cholecystitis with no choledocholithiasis. Status post MRCP showing linear filling defects in the common bile duct without associated extrahepatic biliary dilatation Stable chronic intrahepatic biliary dilatation and volume loss posteriorly in the right hepatic lobe, likely benign based on stability from previous MRI's dating back to 07/12/2014. Underwent ERCP with biliary sphincterotomy, biliary sweeping, balloon extraction and pancreatic stent placement 10/25 and laparoscopic cholecystectomy on 10/26  10/31-11/8 admitted to [facility] with loss of appetite, chills and malaise.  At ED, septic presentation, CT abdomen pelvis with postsurgical changes.  Transferred to [facility] for ID evaluation.  Initially on IV Zosyn which was later discontinued after 5-day course patient clinically improving and was discharged off antibiotics.  12/1-12/14 Admitted for FUO and worsening jaundice, prior to admission had underwent 12/1 repeat ERCP with sphincterotomy and stone retrieval following which he continued to have fevers and right-sided abdominal discomfort MRCP 12/8 with intrahepatic duct dilatation, CBD was nondilated, 12 mm filling defect in the common hepatic duct favoring calculus in addition to new small cystic lesions with rim enhancement in the left lobe and caudate lobe suspicious for hepatic abscesses.  Seen by ID, initially treated with IV ertapenem.  Repeat ERCP 12/12 with sphincterotomy, stone retrieval, biliary stricture (biopsied which was negative for malignancy but cytology was suspicious for malignancy )stent placed.  Liver abscesses were too small to be drained by IR.  PICC line placed and plan for 4 weeks of IV ceftriaxone and Flagyl to be ended on September 29, 2021.   Last seen by Dr. Tommy Medal  in the clinic on 12/15. He was on IV ceftriaxone and PO metronidazole which was planned to be continued until 09/29/21. Also planned for repead advanced  imaging likely as CT had not picked up the abscesses previously. He is supposed to get repeat ERCP   Her is here primarily for need of extension of work excuse letter and FMLA forms  He is getting IV ceftriaxone and PO metronidazole without any issues. No concerns at PICC site.  He pointed me a very small swelling at his anterior chest ( even hard to appreciate) which he told he had noticed newly, non tender, non mobile, overlying skin normal   Review of Systems: ROS negative for fevers chills and diarrhea All others systems reviewed and were negative as above   Past Medical History:  Diagnosis Date   Biliary obstruction due to malignant neoplasm ([place]) 09/05/2021   Diabetes ([place])    GERD (gastroesophageal reflux disease)    HTN (hypertension)    Hyperlipidemia    Liver abscess 08/30/2021   NASH (nonalcoholic steatohepatitis) 09/05/2021   Vaccine counseling 09/05/2021   Past Surgical History:  Procedure Laterality Date  BILIARY BRUSHING  09/02/2021   Procedure: BILIARY BRUSHING;  Surgeon: Clarene Essex, MD;  Location: [facility] ENDOSCOPY;  Service: Endoscopy;;   BILIARY STENT PLACEMENT  09/02/2021   Procedure: BILIARY STENT PLACEMENT;  Surgeon: Clarene Essex, MD;  Location: [facility] ENDOSCOPY;  Service: Endoscopy;;   BIOPSY  09/02/2021   Procedure: BIOPSY;  Surgeon: Clarene Essex, MD;  Location: [facility] ENDOSCOPY;  Service: Endoscopy;;   CHOLECYSTECTOMY N/A 07/17/2021   Procedure: LAPAROSCOPIC CHOLECYSTECTOMY;  Surgeon: Clovis Riley, MD;  Location: [place];  Service: General;  Laterality: N/A;   ENDOSCOPIC RETROGRADE CHOLANGIOPANCREATOGRAPHY (ERCP) WITH PROPOFOL N/A 07/16/2021   Procedure: ENDOSCOPIC RETROGRADE CHOLANGIOPANCREATOGRAPHY (ERCP) WITH PROPOFOL;  Surgeon: Clarene Essex, MD;  Location: [place];  Service: Endoscopy;  Laterality: N/A;   ERCP N/A 08/22/2021   Procedure: ENDOSCOPIC RETROGRADE CHOLANGIOPANCREATOGRAPHY (ERCP);  Surgeon: Clarene Essex, MD;  Location: [facility] ENDOSCOPY;  Service:  Endoscopy;  Laterality: N/A;   ERCP N/A 09/02/2021   Procedure: ENDOSCOPIC RETROGRADE CHOLANGIOPANCREATOGRAPHY (ERCP);  Surgeon: Clarene Essex, MD;  Location: [place];  Service: Endoscopy;  Laterality: N/A;   PANCREATIC STENT PLACEMENT  07/16/2021   Procedure: PANCREATIC STENT PLACEMENT;  Surgeon: Clarene Essex, MD;  Location: [place];  Service: Endoscopy;;   REMOVAL OF STONES  07/16/2021   Procedure: REMOVAL OF STONES;  Surgeon: Clarene Essex, MD;  Location: [facility] ENDOSCOPY;  Service: Endoscopy;;   REMOVAL OF STONES  08/22/2021   Procedure: REMOVAL OF STONES;  Surgeon: Clarene Essex, MD;  Location: [facility] ENDOSCOPY;  Service: Endoscopy;;   REMOVAL OF STONES  09/02/2021   Procedure: REMOVAL OF STONES;  Surgeon: Clarene Essex, MD;  Location: [facility] ENDOSCOPY;  Service: Endoscopy;;   SPHINCTEROTOMY  07/16/2021   Procedure: Joan Mayans;  Surgeon: Clarene Essex, MD;  Location: [facility] ENDOSCOPY;  Service: Endoscopy;;   SPHINCTEROTOMY  08/22/2021   Procedure: Joan Mayans;  Surgeon: Clarene Essex, MD;  Location: [facility] ENDOSCOPY;  Service: Endoscopy;;   SPYGLASS CHOLANGIOSCOPY N/A 09/02/2021   Procedure: UXNATFTD CHOLANGIOSCOPY;  Surgeon: Clarene Essex, MD;  Location: [place];  Service: Endoscopy;  Laterality: N/A;   SPYGLASS LITHOTRIPSY N/A 09/02/2021   Procedure: DUKGURKY LITHOTRIPSY;  Surgeon: Clarene Essex, MD;  Location: [place];  Service: Endoscopy;  Laterality: N/A;    Social History   Tobacco Use   Smoking status: Never   Smokeless tobacco: Never  Substance Use Topics   Alcohol use: Never    Comment: Social drinker only on occasion   Drug use: Never    Family History  Problem Relation Age of Onset   Diabetes Mother    Liver cancer Father    Diabetes Father     Allergies  Allergen Reactions   Bee Venom Hives and Swelling   Benadryl Allergy [Diphenhydramine Hcl] Rash   Flexeril [Cyclobenzaprine] Rash    Health Maintenance  Topic Date Due   OPHTHALMOLOGY EXAM  Never done    TETANUS/TDAP  Never done   Zoster Vaccines- Shingrix (1 of 2) Never done   COLONOSCOPY (Pts 45-62yr Insurance coverage will need to be confirmed)  Never done   Pneumococcal Vaccine [id number] old (2 - PCV) 10/20/2017   FOOT EXAM  08/02/2020   COVID-19 Vaccine (4 - Booster) 10/31/2021   HEMOGLOBIN A1C  01/06/2022   INFLUENZA VACCINE  Completed   Hepatitis C Screening  Completed   HIV Screening  Completed   HPV VACCINES  Aged Out    Objective: BP 101/69    Pulse (!) 102    Temp 98.2 F (36.8 C) (Oral)  Wt 213 lb (96.6 kg)    BMI 28.89 kg/m    Physical Exam Constitutional:      Appearance: Normal appearance.  HENT:     Head: Normocephalic and atraumatic.      Mouth: Mucous membranes are moist.  Eyes:    Conjunctiva/sclera: Conjunctivae normal.     Pupils: Pupils are equal, round, and reactive to light.   Cardiovascular:     Rate and Rhythm: Normal rate and regular rhythm.     Heart sounds: No murmur heard.   Pulmonary:     Effort: Pulmonary effort is normal.     Breath sounds: Normal breath sounds.   Abdominal:     General:     Palpations: Abdomen is soft., Non tender and non distended   Musculoskeletal:        General: Normal range of motion.   Skin:    General: Skin is warm and dry.     Comments: RT arm PICC [place], ? Ssubcutaneous swelling at the anterior chest (  less than 0.5*0.5cm ), non tender and non mobile  Neurological:     General: No focal deficit present.     Mental Status: awake, alert and oriented to person, place, and time.   Psychiatric:        Mood and Affect: Mood normal.   Lab Results Lab Results  Component Value Date   WBC 8.3 09/04/2021   HGB 9.2 (L) 09/04/2021   HCT 29.3 (L) 09/04/2021   MCV 82.8 09/04/2021   PLT 331 09/04/2021    Lab Results  Component Value Date   CREATININE 0.66 09/04/2021   BUN 15 09/04/2021   NA 130 (L) 09/04/2021   K 3.9 09/04/2021   CL 95 (L) 09/04/2021   CO2 28 09/04/2021    Lab Results  Component  Value Date   ALT 55 (H) 09/04/2021   AST 42 (H) 09/04/2021   ALKPHOS 647 (H) 09/04/2021   BILITOT 2.7 (H) 09/04/2021    Lab Results  Component Value Date   CHOL 181 07/08/2021   HDL 47.60 07/08/2021   LDLDIRECT 108.0 07/08/2021   TRIG 268.0 (H) 07/08/2021   CHOLHDL 4 07/08/2021   Lab Results  Component Value Date   LABRPR NON REACTIVE 07/27/2021   No results found for: HIV1RNAQUANT, HIV1RNAVL, CD4TABS  Problem List Items Addressed This Visit       Digestive   Liver abscess - Primary   Relevant Orders   MR ABDOMEN MRCP W WO CONTAST   CBC (Completed)   Comprehensive metabolic panel (Completed)   C-reactive protein (Completed)   Sedimentation rate (Completed)   Cirrhosis of liver without ascites (HCC)   Recurrent cholangitis     Other   Vaccine counseling   Medication monitoring encounter   PICC (peripherally inserted central catheter) in place    Assessment/Plan Recurrent Cholangitis in the setting of choledocholithiasis /stricture and possible biliary malignancy complicated with liver abscesses  - patient is supposed to get ERCP with Dr Watt Climes next week. Discussed with Dr Watt Climes, agrees with getting MR abdomen today for follow up on liver abscesses rather than next week. -Continue IV ceftriaxone and PO metronidazole pending MR abdomen -Fu in  a week for possible transition to PO abtx after MR abdomen -Fu with  Dr Watt Climes as instructed   -Monitor the subcutaneous swelling in the anterior chest( was barely palpable today)  NASH Cirrhosis Counseled for Hep A and B vaccines when able   Medication Monitoring  Previous labs  reviewed, CBC, CMP, ESR and CRP today   PICC in place  No concerns   Excuse from Work  Letter written and extended for 4 more weeks given ongoing medical work up  I have personally spent 55 minutes involved in face-to-face and non-face-to-face activities for this patient on the day of the visit. Professional time spent includes the following  activities: Preparing to see the patient (review of tests), Obtaining and/or reviewing separately obtained history (admission/discharge record), Performing a medically appropriate examination and/or evaluation , Ordering medications/tests/procedures, referring and communicating with other health care professionals, Documenting clinical information in the EMR, Independently interpreting results (not separately reported), Communicating results to the patient/family/caregiver, Counseling and educating the patient/family/caregiver and Care coordination (not separately reported).   Wilber Oliphant, [place] [facility] 09/26/2021, 7:25 AM

## 2021-09-27 DIAGNOSIS — K750 Abscess of liver: Secondary | ICD-10-CM | POA: Diagnosis not present

## 2021-09-27 LAB — COMPREHENSIVE METABOLIC PANEL
AG Ratio: 1.6 (calc) (ref 1.0–2.5)
ALT: 38 U/L (ref 9–46)
AST: 43 U/L — ABNORMAL HIGH (ref 10–35)
Albumin: 4.2 g/dL (ref 3.6–5.1)
Alkaline phosphatase (APISO): 177 U/L — ABNORMAL HIGH (ref 35–144)
BUN/Creatinine Ratio: 28 (calc) — ABNORMAL HIGH (ref 6–22)
BUN: 17 mg/dL (ref 7–25)
CO2: 26 mmol/L (ref 20–32)
Calcium: 9.7 mg/dL (ref 8.6–10.3)
Chloride: 102 mmol/L (ref 98–110)
Creat: 0.61 mg/dL — ABNORMAL LOW (ref 0.70–1.30)
Globulin: 2.6 g/dL (calc) (ref 1.9–3.7)
Glucose, Bld: 110 mg/dL — ABNORMAL HIGH (ref 65–99)
Potassium: 4.3 mmol/L (ref 3.5–5.3)
Sodium: 136 mmol/L (ref 135–146)
Total Bilirubin: 0.9 mg/dL (ref 0.2–1.2)
Total Protein: 6.8 g/dL (ref 6.1–8.1)

## 2021-09-27 LAB — SEDIMENTATION RATE: Sed Rate: 17 mm/h (ref 0–20)

## 2021-09-27 LAB — CBC
HCT: 40.2 % (ref 38.5–50.0)
Hemoglobin: 13.2 g/dL (ref 13.2–17.1)
MCH: 27.2 pg (ref 27.0–33.0)
MCHC: 32.8 g/dL (ref 32.0–36.0)
MCV: 82.9 fL (ref 80.0–100.0)
MPV: 11.9 fL (ref 7.5–12.5)
Platelets: 133 10*3/uL — ABNORMAL LOW (ref 140–400)
RBC: 4.85 10*6/uL (ref 4.20–5.80)
RDW: 16.3 % — ABNORMAL HIGH (ref 11.0–15.0)
WBC: 6.9 10*3/uL (ref 3.8–10.8)

## 2021-09-27 LAB — C-REACTIVE PROTEIN: CRP: 3.8 mg/L (ref <8.0)

## 2021-09-28 DIAGNOSIS — K750 Abscess of liver: Secondary | ICD-10-CM | POA: Diagnosis not present

## 2021-09-29 DIAGNOSIS — Z5181 Encounter for therapeutic drug level monitoring: Secondary | ICD-10-CM | POA: Insufficient documentation

## 2021-09-29 DIAGNOSIS — Z452 Encounter for adjustment and management of vascular access device: Secondary | ICD-10-CM | POA: Insufficient documentation

## 2021-09-29 DIAGNOSIS — Z0289 Encounter for other administrative examinations: Secondary | ICD-10-CM | POA: Insufficient documentation

## 2021-09-29 DIAGNOSIS — K750 Abscess of liver: Secondary | ICD-10-CM | POA: Diagnosis not present

## 2021-09-30 ENCOUNTER — Encounter: Payer: Self-pay | Admitting: Infectious Disease

## 2021-09-30 ENCOUNTER — Ambulatory Visit: Payer: BC Managed Care – PPO | Admitting: Family Medicine

## 2021-09-30 NOTE — Telephone Encounter (Signed)
Relayed verbal orders to [place] [facility] to continue IV antibiotics until patient's follow up 10/04/21 per Dr. [place] Bali. At which point a Dr. Juleen China will determine next steps. Orders repeated and verified.   Beryle Flock, RN

## 2021-09-30 NOTE — Anesthesia Preprocedure Evaluation (Addendum)
Anesthesia Evaluation  Patient identified by MRN, date of birth, ID band Patient awake    Reviewed: Allergy & Precautions, NPO status , Patient's Chart, lab work & pertinent test results  Airway Mallampati: II  TM Distance: >3 FB Neck ROM: Full    Dental no notable dental hx. (+) Dental Advisory Given   Pulmonary neg pulmonary ROS,    Pulmonary exam normal breath sounds clear to auscultation       Cardiovascular hypertension, Pt. on medications Normal cardiovascular exam Rhythm:Regular Rate:Normal     Neuro/Psych PSYCHIATRIC DISORDERS Depression negative neurological ROS     GI/Hepatic GERD  Medicated and Controlled,(+) Hepatitis - (NASH)Biliary obstruction s/p multiple stents, sphincterotomies   NASH   Endo/Other  diabetes, Poorly Controlled, Type 2, Oral Hypoglycemic Agents, Insulin Dependenta1c 9.2 FS 102  Renal/GU negative Renal ROS  negative genitourinary   Musculoskeletal negative musculoskeletal ROS (+)   Abdominal   Peds  Hematology negative hematology ROS (+) hct 40.2   Anesthesia Other Findings   Reproductive/Obstetrics negative OB ROS                            Anesthesia Physical Anesthesia Plan  ASA: 2  Anesthesia Plan: General   Post-op Pain Management:    Induction: Intravenous  PONV Risk Score and Plan: Ondansetron, Dexamethasone, Midazolam and Treatment may vary due to age or medical condition  Airway Management Planned: Oral ETT  Additional Equipment: None  Intra-op Plan:   Post-operative Plan: Extubation in OR  Informed Consent: I have reviewed the patients History and Physical, chart, labs and discussed the procedure including the risks, benefits and alternatives for the proposed anesthesia with the patient or authorized representative who has indicated his/her understanding and acceptance.     Dental advisory given  Plan Discussed with:  CRNA  Anesthesia Plan Comments: (Last ercp 08/2021: grade 1 view w/ mac 4 blade )       Anesthesia Quick Evaluation

## 2021-10-01 ENCOUNTER — Ambulatory Visit (HOSPITAL_COMMUNITY): Payer: BC Managed Care – PPO | Admitting: Anesthesiology

## 2021-10-01 ENCOUNTER — Ambulatory Visit (HOSPITAL_COMMUNITY)
Admission: RE | Admit: 2021-10-01 | Discharge: 2021-10-01 | Disposition: A | Discharge status: Home / Self Care | Payer: BC Managed Care – PPO | Attending: Gastroenterology | Admitting: Gastroenterology

## 2021-10-01 ENCOUNTER — Other Ambulatory Visit: Payer: Self-pay

## 2021-10-01 ENCOUNTER — Ambulatory Visit (HOSPITAL_COMMUNITY): Payer: BC Managed Care – PPO

## 2021-10-01 ENCOUNTER — Encounter (HOSPITAL_COMMUNITY): Payer: Self-pay | Admitting: Gastroenterology

## 2021-10-01 ENCOUNTER — Encounter (HOSPITAL_COMMUNITY)
Admission: RE | Disposition: A | Discharge status: Home / Self Care | Payer: Self-pay | Source: Home / Self Care | Attending: Gastroenterology

## 2021-10-01 DIAGNOSIS — K831 Obstruction of bile duct: Secondary | ICD-10-CM

## 2021-10-01 DIAGNOSIS — K8051 Calculus of bile duct without cholangitis or cholecystitis with obstruction: Secondary | ICD-10-CM | POA: Diagnosis not present

## 2021-10-01 DIAGNOSIS — R17 Unspecified jaundice: Secondary | ICD-10-CM | POA: Diagnosis not present

## 2021-10-01 DIAGNOSIS — I10 Essential (primary) hypertension: Secondary | ICD-10-CM | POA: Diagnosis not present

## 2021-10-01 DIAGNOSIS — R945 Abnormal results of liver function studies: Secondary | ICD-10-CM | POA: Diagnosis not present

## 2021-10-01 DIAGNOSIS — R932 Abnormal findings on diagnostic imaging of liver and biliary tract: Secondary | ICD-10-CM | POA: Insufficient documentation

## 2021-10-01 DIAGNOSIS — R599 Enlarged lymph nodes, unspecified: Secondary | ICD-10-CM | POA: Insufficient documentation

## 2021-10-01 DIAGNOSIS — E119 Type 2 diabetes mellitus without complications: Secondary | ICD-10-CM | POA: Insufficient documentation

## 2021-10-01 DIAGNOSIS — Z79899 Other long term (current) drug therapy: Secondary | ICD-10-CM | POA: Insufficient documentation

## 2021-10-01 DIAGNOSIS — E785 Hyperlipidemia, unspecified: Secondary | ICD-10-CM | POA: Insufficient documentation

## 2021-10-01 DIAGNOSIS — Z9889 Other specified postprocedural states: Secondary | ICD-10-CM | POA: Diagnosis not present

## 2021-10-01 DIAGNOSIS — K8050 Calculus of bile duct without cholangitis or cholecystitis without obstruction: Secondary | ICD-10-CM | POA: Diagnosis not present

## 2021-10-01 DIAGNOSIS — K838 Other specified diseases of biliary tract: Secondary | ICD-10-CM | POA: Diagnosis not present

## 2021-10-01 DIAGNOSIS — B3789 Other sites of candidiasis: Secondary | ICD-10-CM | POA: Diagnosis not present

## 2021-10-01 DIAGNOSIS — K219 Gastro-esophageal reflux disease without esophagitis: Secondary | ICD-10-CM | POA: Diagnosis not present

## 2021-10-01 HISTORY — PX: ENDOSCOPIC RETROGRADE CHOLANGIOPANCREATOGRAPHY (ERCP) WITH PROPOFOL: SHX5810

## 2021-10-01 HISTORY — PX: BILIARY BRUSHING: SHX6843

## 2021-10-01 HISTORY — PX: STENT REMOVAL: SHX6421

## 2021-10-01 HISTORY — PX: EUS: SHX5427

## 2021-10-01 HISTORY — PX: SPYGLASS CHOLANGIOSCOPY: SHX5441

## 2021-10-01 HISTORY — PX: REMOVAL OF STONES: SHX5545

## 2021-10-01 HISTORY — PX: BIOPSY: SHX5522

## 2021-10-01 HISTORY — PX: LITHOTRIPSY: SHX5546

## 2021-10-01 HISTORY — PX: ESOPHAGOGASTRODUODENOSCOPY: SHX5428

## 2021-10-01 HISTORY — PX: FINE NEEDLE ASPIRATION: SHX5430

## 2021-10-01 LAB — GLUCOSE, CAPILLARY: Glucose-Capillary: 102 mg/dL — ABNORMAL HIGH (ref 70–99)

## 2021-10-01 SURGERY — UPPER ENDOSCOPIC ULTRASOUND (EUS) LINEAR
Anesthesia: General

## 2021-10-01 MED ORDER — MIDAZOLAM HCL 5 MG/5ML IJ SOLN
INTRAMUSCULAR | Status: DC | PRN
  Administered 2021-10-01: 2 mg via INTRAVENOUS

## 2021-10-01 MED ORDER — INDOMETHACIN 50 MG RE SUPP
RECTAL | Status: AC
  Filled 2021-10-01: qty 2

## 2021-10-01 MED ORDER — GLUCAGON HCL RDNA (DIAGNOSTIC) 1 MG IJ SOLR
INTRAMUSCULAR | Status: AC
  Filled 2021-10-01: qty 1

## 2021-10-01 MED ORDER — SODIUM CHLORIDE 0.9 % IV SOLN: INTRAVENOUS | Status: DC

## 2021-10-01 MED ORDER — MIDAZOLAM HCL 2 MG/2ML IJ SOLN
INTRAMUSCULAR | Status: AC
  Filled 2021-10-01: qty 2

## 2021-10-01 MED ORDER — LACTATED RINGERS IV SOLN
INTRAVENOUS | Status: DC | PRN
Start: 2021-10-01 — End: 2021-10-01

## 2021-10-01 MED ORDER — SUGAMMADEX SODIUM 200 MG/2ML IV SOLN
INTRAVENOUS | Status: DC | PRN
  Administered 2021-10-01: 200 mg via INTRAVENOUS

## 2021-10-01 MED ORDER — PROPOFOL 10 MG/ML IV BOLUS
INTRAVENOUS | Status: DC | PRN
  Administered 2021-10-01: 150 mg via INTRAVENOUS

## 2021-10-01 MED ORDER — ROCURONIUM BROMIDE 100 MG/10ML IV SOLN
INTRAVENOUS | Status: DC | PRN
  Administered 2021-10-01: 70 mg via INTRAVENOUS

## 2021-10-01 MED ORDER — ONDANSETRON HCL 4 MG/2ML IJ SOLN
INTRAMUSCULAR | Status: DC | PRN
  Administered 2021-10-01: 4 mg via INTRAVENOUS

## 2021-10-01 MED ORDER — CIPROFLOXACIN IN D5W 400 MG/200ML IV SOLN
INTRAVENOUS | Status: AC
  Filled 2021-10-01: qty 200

## 2021-10-01 MED ORDER — LACTATED RINGERS IV SOLN
INTRAVENOUS | Status: AC | PRN
  Administered 2021-10-01: 10 mL/h via INTRAVENOUS

## 2021-10-01 MED ORDER — SODIUM CHLORIDE 0.9 % IV SOLN
INTRAVENOUS | Status: DC | PRN
  Administered 2021-10-01: 50 mL

## 2021-10-01 MED ORDER — LIDOCAINE HCL (CARDIAC) PF 100 MG/5ML IV SOSY
PREFILLED_SYRINGE | INTRAVENOUS | Status: DC | PRN
  Administered 2021-10-01: 60 mg via INTRAVENOUS

## 2021-10-01 MED ORDER — FENTANYL CITRATE (PF) 100 MCG/2ML IJ SOLN
INTRAMUSCULAR | Status: AC
  Filled 2021-10-01: qty 2

## 2021-10-01 MED ORDER — INDOMETHACIN 50 MG RE SUPP
RECTAL | Status: DC | PRN
  Administered 2021-10-01: 100 mg via RECTAL

## 2021-10-01 MED ORDER — PROPOFOL 10 MG/ML IV BOLUS
INTRAVENOUS | Status: AC
  Filled 2021-10-01: qty 20

## 2021-10-01 MED ORDER — FENTANYL CITRATE (PF) 100 MCG/2ML IJ SOLN
INTRAMUSCULAR | Status: DC | PRN
  Administered 2021-10-01 (×2): 50 ug via INTRAVENOUS

## 2021-10-01 MED ORDER — CIPROFLOXACIN IN D5W 400 MG/200ML IV SOLN
INTRAVENOUS | Status: DC | PRN
Start: 2021-10-01 — End: 2021-10-01
  Administered 2021-10-01: 400 mg via INTRAVENOUS

## 2021-10-01 NOTE — Op Note (Signed)
[facility] Patient Name: Omar Travis Procedure Date: 10/01/2021 MRN: [id number] Attending MD: Clarene Essex , MD Date of Birth: Feb 05, 1966 CSN: [id number] Age: 56 Admit Type: Outpatient Procedure:                ERCP Indications:              Evaluation and possible treatment of bile duct                            stone(s) and abnormal brushing on previous proximal                            stricture Providers:                Clarene Essex, MD, Burtis Junes, RN, Tyna Jaksch                            Technician Referring MD:              Medicines:                General Anesthesia Complications:            No immediate complications. Estimated Blood Loss:     Estimated blood loss: none. Procedure:                Pre-Anesthesia Assessment:                           - Prior to the procedure, a History and Physical                            was performed, and patient medications and                            allergies were reviewed. The patient's tolerance of                            previous anesthesia was also reviewed. The risks                            and benefits of the procedure and the sedation                            options and risks were discussed with the patient.                            All questions were answered, and informed consent                            was obtained. Prior Anticoagulants: The patient has                            taken no previous anticoagulant or antiplatelet                            agents. ASA Grade Assessment: III - A  patient with                            severe systemic disease. After reviewing the risks                            and benefits, the patient was deemed in                            satisfactory condition to undergo the procedure.                           After obtaining informed consent, the scope was                            passed under direct vision. Throughout the                             procedure, the patient's blood pressure, pulse, and                            oxygen saturations were monitored continuously. The                            [id number]-Q190V ([id number]) Olympus duodenoscope was                            introduced through the mouth, and used to inject                            contrast into and used to cannulate the bile duct.                            The ERCP was technically difficult and complex due                            to abnormal anatomy. Successful completion of the                            procedure was aided by performing the maneuvers                            documented (below) in this report. The patient                            tolerated the procedure well. Scope In: Scope Out: Findings:      A biliary sphincterotomy had been performed. The sphincterotomy appeared       open. Deep selective cannulation was readily obtained and on initial       cholangiogram multiple obvious small stones were seen and the biliary       tree was swept with an adjustable 9- 12 mm balloon starting at the       bifurcation and left main hepatic duct. Both size balloons were used for  the left main and the 12 mm balloon passed readily down the main bile       duct and sludge was swept from the duct. Many stones were removed. No       stones remained on post sweeping cholangiogram and there was no obvious       stricture seen and the 12 mm balloon passed readily as above. The bile       duct was explored endoscopically using the SpyGlass direct visualization       system. The SpyScope was advanced to the bifurcation. Visibility with       the scope was good. The upper third of the main bile duct contained a       localized irregularity which was biopsied with the spy bite forceps but       just looked like inflammation and the upper third of the main bile duct       was biopsied with a SpyBite miniature biopsy forceps for histology. Some       residual  stones were seen in the duct and electrohydraulic lithotripsy       was successful. Unfortunately we did have to use 2 probes but multiple       stones were broken into multiple small pieces and the biliary tree was       swept with a 12 mm balloon multiple times and then we withdrew the 15 mm       balloon starting at the bifurcation multiple times. All stones were       removed. Nothing was found on occlusion cholangiogram at the end of the       procedure. Cells for cytology were obtained by brushing in the upper       third of the main bile duct. We then repeated the occlusion       cholangiogram and again the 12 mm balloon passed readily through the       patent CBD although the 15 had some troubles being pulled proximally but       no stricture was seen so we elected to leave the stent out at this time       and there was no pancreatic duct injection or wire advancement       throughout the procedure Impression:               - Prior biliary sphincterotomy appeared open.                           - An irregularity was found in the upper third of                            the main bile duct. This was biopsied as above with                            the spy bite forceps but looked inflammatory                           - Choledocholithiasis was found. Complete removal                            was accomplished by balloon extraction.                           -  The biliary tree was swept multiple times as                            above using balloons from 9 to 15 mm.                           - Biopsy was performed in the upper third of the                            main bile duct.                           - Lithotripsy was successful.                           - The biliary tree was swept and nothing was found                            at the end of the procedure.                           - Cells for cytology obtained in the upper third of                            the main  bile duct. Moderate Sedation:      Not Applicable - Patient had care per Anesthesia. Recommendation:           - Clear liquid diet for 6 hours. If doing well this                            evening may have soft solids if not slowly advance                            diet tomorrow                           - Continue present medications.                           - Await cytology results and await path results.                           - Return to GI clinic PRN.                           - Telephone GI clinic for pathology results in 5                            days.                           - Telephone GI clinic if symptomatic PRN.                            Particularly if recurrent  fever nausea vomiting                            abdominal pain or signs of GI bleeding or yellow                            jaundice Procedure Code(s):        --- Professional ---                           [phone], Endoscopic retrograde                            cholangiopancreatography (ERCP); with destruction                            of calculi, any method (eg, mechanical,                            electrohydraulic, lithotripsy)                           43261, Endoscopic retrograde                            cholangiopancreatography (ERCP); with biopsy,                            single or multiple                           25366, Endoscopic cannulation of papilla with                            direct visualization of pancreatic/common bile                            duct(s) (List separately in addition to code(s) for                            primary procedure) Diagnosis Code(s):        --- Professional ---                           K80.50, Calculus of bile duct without cholangitis                            or cholecystitis without obstruction                           R93.2, Abnormal findings on diagnostic imaging of                            liver and biliary tract CPT copyright 2019  American Medical Association. All rights reserved. The codes documented in this report are preliminary and upon coder review may  be revised to meet current compliance requirements. Clarene Essex, MD 10/01/2021 3:56:48 PM This report has been  signed electronically. Number of Addenda: 0

## 2021-10-01 NOTE — H&P (Signed)
[facility] H/P note  Chief Complaint: biliary stricture  HPI: Omar Travis is an 56 y.o. male.  History of pancreatitis with biliary stricture.  Multiple prior procedures.  Since last procedure, patient has done well without abdominal pain, loss-of-appetite, weight loss.  Past Medical History:  Diagnosis Date   Biliary obstruction due to malignant neoplasm ([name]) 09/05/2021   Diabetes ([place])    GERD (gastroesophageal reflux disease)    HTN (hypertension)    Hyperlipidemia    Liver abscess 08/30/2021   NASH (nonalcoholic steatohepatitis) 09/05/2021   Vaccine counseling 09/05/2021    Past Surgical History:  Procedure Laterality Date   BILIARY BRUSHING  09/02/2021   Procedure: BILIARY BRUSHING;  Surgeon: Clarene Essex, MD;  Location: [place];  Service: Endoscopy;;   BILIARY STENT PLACEMENT  09/02/2021   Procedure: BILIARY STENT PLACEMENT;  Surgeon: Clarene Essex, MD;  Location: [place];  Service: Endoscopy;;   BIOPSY  09/02/2021   Procedure: BIOPSY;  Surgeon: Clarene Essex, MD;  Location: [place];  Service: Endoscopy;;   CHOLECYSTECTOMY N/A 07/17/2021   Procedure: LAPAROSCOPIC CHOLECYSTECTOMY;  Surgeon: Clovis Riley, MD;  Location: [place];  Service: General;  Laterality: N/A;   ENDOSCOPIC RETROGRADE CHOLANGIOPANCREATOGRAPHY (ERCP) WITH PROPOFOL N/A 07/16/2021   Procedure: ENDOSCOPIC RETROGRADE CHOLANGIOPANCREATOGRAPHY (ERCP) WITH PROPOFOL;  Surgeon: Clarene Essex, MD;  Location: [place];  Service: Endoscopy;  Laterality: N/A;   ERCP N/A 08/22/2021   Procedure: ENDOSCOPIC RETROGRADE CHOLANGIOPANCREATOGRAPHY (ERCP);  Surgeon: Clarene Essex, MD;  Location: [facility];  Service: Endoscopy;  Laterality: N/A;   ERCP N/A 09/02/2021   Procedure: ENDOSCOPIC RETROGRADE CHOLANGIOPANCREATOGRAPHY (ERCP);  Surgeon: Clarene Essex, MD;  Location: [place];  Service: Endoscopy;  Laterality: N/A;   PANCREATIC STENT PLACEMENT  07/16/2021   Procedure: PANCREATIC STENT  PLACEMENT;  Surgeon: Clarene Essex, MD;  Location: [place];  Service: Endoscopy;;   REMOVAL OF STONES  07/16/2021   Procedure: REMOVAL OF STONES;  Surgeon: Clarene Essex, MD;  Location: [facility] ENDOSCOPY;  Service: Endoscopy;;   REMOVAL OF STONES  08/22/2021   Procedure: REMOVAL OF STONES;  Surgeon: Clarene Essex, MD;  Location: [facility] ENDOSCOPY;  Service: Endoscopy;;   REMOVAL OF STONES  09/02/2021   Procedure: REMOVAL OF STONES;  Surgeon: Clarene Essex, MD;  Location[facility] ENDOSCOPY;  Service: Endoscopy;;   SPHINCTEROTOMY  07/16/2021   Procedure: Joan Mayans;  Surgeon: Clarene Essex, MD;  Location: [facility] ENDOSCOPY;  Service: Endoscopy;;   SPHINCTEROTOMY  08/22/2021   Procedure: Joan Mayans;  Surgeon: Clarene Essex, MD;  Location: [facility] ENDOSCOPY;  Service: Endoscopy;;   SPYGLASS CHOLANGIOSCOPY N/A 09/02/2021   Procedure: OVFIEPPI CHOLANGIOSCOPY;  Surgeon: Clarene Essex, MD;  Location: [place];  Service: Endoscopy;  Laterality: N/A;   SPYGLASS LITHOTRIPSY N/A 09/02/2021   Procedure: RJJOACZY LITHOTRIPSY;  Surgeon: Clarene Essex, MD;  Location: [place];  Service: Endoscopy;  Laterality: N/A;    Medications Prior to Admission  Medication Sig Dispense Refill   BD PEN NEEDLE NANO U/F 32G X 4 MM MISC 1 each by Other route 2 (two) times daily.     canagliflozin (INVOKANA) 300 MG TABS tablet Take 300 mg by mouth daily before breakfast.     cefTRIAXone (ROCEPHIN) IVPB Inject 2 g into the vein daily. Indication:  liver abscess First Dose: No Last Day of Therapy:  09/29/2021 Labs - Once weekly:  CBC/D and BMP, Labs - Every other week:  ESR and CRP Method of administration: IV Push Method of administration may be changed at the discretion of home infusion pharmacist based  upon assessment of the patient and/or caregiver's ability to self-administer the medication ordered. 27 Units 0   Continuous Blood Gluc Sensor (DEXCOM G6 SENSOR) MISC Change sensor every 10 days     Continuous Blood Gluc Transmit (DEXCOM G6  TRANSMITTER) MISC Use as directed     escitalopram (LEXAPRO) 10 MG tablet Take 1 tablet (10 mg total) by mouth daily. 30 tablet 4   fenofibrate (TRICOR) 145 MG tablet Take 145 mg by mouth daily.     insulin glargine, 1 Unit Dial, (TOUJEO SOLOSTAR) 300 UNIT/ML Solostar Pen Inject 20 Units into the skin daily. (Patient taking differently: Inject 30 Units into the skin at bedtime.)     lisinopril (ZESTRIL) 5 MG tablet TAKE ONE (1) TABLET EACH DAY (Patient taking differently: Take 5 mg by mouth daily.) 90 tablet 1   metroNIDAZOLE (FLAGYL) 500 MG tablet Take 1 tablet (500 mg total) by mouth every 12 (twelve) hours for 27 days. 54 tablet 0   omeprazole (PRILOSEC) 20 MG capsule Take 1 capsule (20 mg total) by mouth daily. 90 capsule 3   ONETOUCH VERIO test strip 1 each by Other route 2 (two) times daily.     OZEMPIC, 1 MG/DOSE, 4 MG/3ML SOPN Inject 1 mg into the skin every Monday.     rosuvastatin (CRESTOR) 10 MG tablet Take 10 mg by mouth at bedtime.     ondansetron (ZOFRAN-ODT) 8 MG disintegrating tablet Take 8 mg by mouth every 8 (eight) hours as needed for nausea.     oxyCODONE (OXY IR/ROXICODONE) 5 MG immediate release tablet Take 1 tablet (5 mg total) by mouth every 6 (six) hours as needed for moderate pain or severe pain (not relieved by tylenol or ibuprofen.). 15 tablet 0    Allergies:  Allergies  Allergen Reactions   Bee Venom Hives and Swelling   Benadryl Allergy [Diphenhydramine Hcl] Rash   Flexeril [Cyclobenzaprine] Rash    Family History  Problem Relation Age of Onset   Diabetes Mother    Liver cancer Father    Diabetes Father     Social History:  reports that he has never smoked. He has never used smokeless tobacco. He reports that he does not drink alcohol and does not use drugs.   ROS: As per HPI, all others negative   Blood pressure 110/75, pulse 95, temperature 97.8 F (36.6 C), temperature source Temporal, resp. rate 13, height 6' (1.829 m), weight 96.6 kg, SpO2 98  %. General appearance: NAD HEENT:  NCAT ABD:  Soft, non-tender NEURO:  Non focal, A/O x 4 CV:  Regular  RESP:  Clear  Results for orders placed or performed during the hospital encounter of 10/01/21 (from the past 48 hour(s))  Glucose, capillary     Status: Abnormal   Collection Time: 10/01/21 11:35 AM  Result Value Ref Range   Glucose-Capillary 102 (H) 70 - 99 mg/dL    Comment: Glucose reference range applies only to samples taken after fasting for at least 8 hours.   No results found.  Assessment/Plan   Biliary stricture of unclear etiology (malignant or benign due to pancreatitis). Upper endoscopic ultrasound (Dr. Paulita Fujita) followed by ERCP (Dr. Watt Climes). Risks (bleeding, infection, bowel perforation that could require surgery, sedation-related changes in cardiopulmonary systems), benefits (identification and possible treatment of source of symptoms, exclusion of certain causes of symptoms), and alternatives (watchful waiting, radiographic imaging studies, empiric medical treatment) of upper endoscopy with ultrasound and possible fine needle aspiration (EUS +/- FNA) were explained to patient/family in  detail and patient wishes to proceed.  Risks (up to and including bleeding, infection, perforation, pancreatitis that can be complicated by infected necrosis and death), benefits (removal of stones, alleviating blockage, decreasing risk of cholangitis or choledocholithiasis-related pancreatitis), and alternatives (watchful waiting, percutaneous transhepatic cholangiography) of ERCP were explained to patient/family in detail and patient elects to proceed.   Landry Dyke 10/01/2021, 12:05 PM

## 2021-10-01 NOTE — Anesthesia Postprocedure Evaluation (Signed)
Anesthesia Post Note  Patient: Omar Travis  Procedure(s) Performed: UPPER ENDOSCOPIC ULTRASOUND (EUS) LINEAR STENT REMOVAL FINE NEEDLE ASPIRATION (FNA) LINEAR ESOPHAGOGASTRODUODENOSCOPY (EGD) ENDOSCOPIC RETROGRADE CHOLANGIOPANCREATOGRAPHY (ERCP) WITH PROPOFOL REMOVAL OF STONES SPYGLASS CHOLANGIOSCOPY LITHOTRIPSY BIOPSY BILIARY BRUSHING     Patient location during evaluation: PACU Anesthesia Type: General Level of consciousness: awake and alert, oriented and patient cooperative Pain management: pain level controlled Vital Signs Assessment: post-procedure vital signs reviewed and stable Respiratory status: spontaneous breathing, nonlabored ventilation and respiratory function stable Cardiovascular status: blood pressure returned to baseline and stable Postop Assessment: no apparent nausea or vomiting Anesthetic complications: no   No notable events documented.  Last Vitals:  Vitals:   10/01/21 1133 10/01/21 1550  BP: 110/75 115/67  Pulse: 95 99  Resp: 13 17  Temp: 36.6 C   SpO2: 98% 100%    Last Pain:  Vitals:   10/01/21 1550  TempSrc:   PainSc: 0-No pain                 Pervis Hocking

## 2021-10-01 NOTE — Transfer of Care (Signed)
Immediate Anesthesia Transfer of Care Note  Patient: Omar Travis  Procedure(s) Performed: UPPER ENDOSCOPIC ULTRASOUND (EUS) LINEAR STENT REMOVAL FINE NEEDLE ASPIRATION (FNA) LINEAR ESOPHAGOGASTRODUODENOSCOPY (EGD) ENDOSCOPIC RETROGRADE CHOLANGIOPANCREATOGRAPHY (ERCP) WITH PROPOFOL REMOVAL OF STONES SPYGLASS CHOLANGIOSCOPY LITHOTRIPSY BIOPSY BILIARY BRUSHING  Patient Location: PACU  Anesthesia Type:MAC  Level of Consciousness: drowsy  Airway & Oxygen Therapy: Patient Spontanous Breathing and Patient connected to face mask oxygen  Post-op Assessment: Report given to RN, Post -op Vital signs reviewed and stable and Patient moving all extremities X 4  Post vital signs: Reviewed and stable  Last Vitals:  Vitals Value Taken Time  BP 115/67 10/01/21 1550  Temp    Pulse 97 10/01/21 1551  Resp 16 10/01/21 1551  SpO2 100 % 10/01/21 1551  Vitals shown include unvalidated device data.  Last Pain:  Vitals:   10/01/21 1133  TempSrc: Temporal  PainSc: 0-No pain         Complications: No notable events documented.

## 2021-10-01 NOTE — Discharge Instructions (Addendum)
YOU HAD AN ENDOSCOPIC PROCEDURE TODAY: Refer to the procedure report and other information in the discharge instructions given to you for any specific questions about what was found during the examination. If this information does not answer your questions, please call [facility] office at [phone] to clarify.   YOU SHOULD EXPECT: Some feelings of bloating in the abdomen. Passage of more gas than usual. Walking can help get rid of the air that was put into your GI tract during the procedure and reduce the bloating.  DIET: Your first meal following the procedure should be a light meal and then it is ok to progress to your normal diet. A half-sandwich or bowl of soup is an example of a good first meal. Heavy or fried foods are harder to digest and may make you feel nauseous or bloated. Drink plenty of fluids but you should avoid alcoholic beverages for 24 hours.  ACTIVITY: Your care partner should take you home directly after the procedure. You should plan to take it easy, moving slowly for the rest of the day. You can resume normal activity the day after the procedure however YOU SHOULD NOT DRIVE, use power tools, machinery or perform tasks that involve climbing or major physical exertion for 24 hours (because of the sedation medicines used during the test).   SYMPTOMS TO REPORT IMMEDIATELY: A gastroenterologist can be reached at any hour. Please call [phone]  for any of the following symptoms:   Following upper endoscopy (EGD, EUS, ERCP, esophageal dilation) Vomiting of blood or coffee ground material  New, significant abdominal pain  New, significant chest pain or pain under the shoulder blades  Painful or persistently difficult swallowing  New shortness of breath  Black, tarry-looking or red, bloody stools  FOLLOW UP:  If any biopsies were taken you will be contacted by phone or by letter within the next 1-3 weeks. Call [phone]  if you have not heard about the biopsies in 3 weeks.   Please also call with any specific questions about appointments or follow up tests.  Clear liquids only until 9 PM and if doing well may have soft solids this evening if not may slowly advance diet tomorrow and call if yellow jaundice fever nausea vomiting abdominal pain or signs of GI bleeding otherwise call in 5 days for biopsy results and we will review the MRI at that point and decide how to proceed going forward

## 2021-10-01 NOTE — Anesthesia Procedure Notes (Signed)
Procedure Name: Intubation Date/Time: 10/01/2021 1:00 PM Performed by: [place] Rosilyn Coachman C, CRNA Pre-anesthesia Checklist: Patient identified, Emergency Drugs available, Suction available and Patient being monitored Patient Re-evaluated:Patient Re-evaluated prior to induction Oxygen Delivery Method: Circle system utilized Preoxygenation: Pre-oxygenation with 100% oxygen Induction Type: IV induction Ventilation: Mask ventilation without difficulty Laryngoscope Size: Mac and 4 Grade View: Grade I Tube type: Oral Tube size: 7.5 mm Number of attempts: 1 Airway Equipment and Method: Stylet and Oral airway Placement Confirmation: ETT inserted through vocal cords under direct vision, positive ETCO2 and breath sounds checked- equal and bilateral Secured at: 22 cm Tube secured with: Tape Dental Injury: Teeth and Oropharynx as per pre-operative assessment

## 2021-10-01 NOTE — Op Note (Signed)
[facility] Patient Name: Omar Travis Procedure Date: 10/01/2021 MRN: [id number] Attending MD: Arta Silence , MD Date of Birth: 01/11/1966 CSN: [id number] Age: 56 Admit Type: Outpatient Procedure:                Upper EUS Indications:              bile duct stricture Providers:                Arta Silence, MD, Burtis Junes, RN, Frazier Richards,                            Technician Referring MD:             Dr. Clarene Essex Medicines:                General Anesthesia, Cipro 644 mg IV Complications:            No immediate complications. Estimated Blood Loss:     Estimated blood loss: none. Procedure:                Pre-Anesthesia Assessment:                           - Prior to the procedure, a History and Physical                            was performed, and patient medications and                            allergies were reviewed. The patient's tolerance of                            previous anesthesia was also reviewed. The risks                            and benefits of the procedure and the sedation                            options and risks were discussed with the patient.                            All questions were answered, and informed consent                            was obtained. Prior Anticoagulants: The patient has                            taken no previous anticoagulant or antiplatelet                            agents. ASA Grade Assessment: III - A patient with                            severe systemic disease. After reviewing the risks  and benefits, the patient was deemed in                            satisfactory condition to undergo the procedure.                           After obtaining informed consent, the endoscope was                            passed under direct vision. Throughout the                            procedure, the patient's blood pressure, pulse, and                            oxygen  saturations were monitored continuously. The                            [id number] ([id number] Olympus linear ultrasound scope                            was introduced through the mouth, and advanced to                            the second part of duodenum. The upper EUS was                            accomplished without difficulty. The patient                            tolerated the procedure well. Scope In: Scope Out: Findings:      ENDOSCOPIC FINDING: :      A previously placed plastic stent was seen at the major papilla. Bile       duct stent removed and sent for pathology.      ENDOSONOGRAPHIC FINDING: :      There was no sign of significant endosonographic abnormality in the       ampulla.      A few lymph nodes were visualized with the ultrasound probe in the porta       hepatis region. The nodes were triangular. Fine needle aspiration for       cytology was performed. Color Doppler imaging was utilized prior to       needle puncture to confirm a lack of significant vascular structures       within the needle path. Four passes were made with the 25 gauge needle       using a transduodenal approach. A stylet was used. A cytotechnologist       was present to evaluate the adequacy of the specimen. Final cytology       results are pending.      There was no sign of significant endosonographic abnormality in the       pancreatic head and genu of the pancreas.      One stone was visualized endosonographically in the common hepatic duct.       The stone measured 10 mm in greatest dimension. The stone  was irregular.       It was characterized by shadowing.      There was a suggestion of a stricture in the common hepatic duct.       Symmetric bile duct thickening from proximal CBD to level of ampulla. No       obvious bile duct tumor seen. No obvious pancreatic lesion seen. Impression:               - Plastic stent in the duodenum.                           - There was no sign of  significant pathology in the                            ampulla.                           - A few lymph nodes were visualized and measured in                            the porta hepatis region. Fine needle aspiration                            performed.                           - There was no sign of significant pathology in the                            pancreatic head and genu of the pancreas.                           - One stone was visualized endosonographically in                            the common hepatic duct.                           - There was a suggestion of a stricture in the                            common hepatic duct. Moderate Sedation:      None Recommendation:           - Await cytology results.                           - Perform an ERCP today. Procedure Code(s):        --- Professional ---                           [phone], Esophagogastroduodenoscopy, flexible,                            transoral; with transendoscopic ultrasound-guided  intramural or transmural fine needle                            aspiration/biopsy(s), (includes endoscopic                            ultrasound examination limited to the esophagus,                            stomach or duodenum, and adjacent structures) Diagnosis Code(s):        --- Professional ---                           K80.50, Calculus of bile duct without cholangitis                            or cholecystitis without obstruction                           K83.1, Obstruction of bile duct                           R93.2, Abnormal findings on diagnostic imaging of                            liver and biliary tract CPT copyright 2019 [employer]. All rights reserved. The codes documented in this report are preliminary and upon coder review may  be revised to meet current compliance requirements. Arta Silence, MD 10/01/2021 2:24:10 PM This report has been signed  electronically. Number of Addenda: 0

## 2021-10-02 ENCOUNTER — Telehealth: Payer: Self-pay

## 2021-10-02 DIAGNOSIS — K750 Abscess of liver: Secondary | ICD-10-CM | POA: Diagnosis not present

## 2021-10-02 NOTE — Telephone Encounter (Signed)
Meredith with patient's [facility] agency called stating that they would be unable to draw the blood cultures for the patient due to not have the appropriate supplies to draw the blood cultures.  I spoke with the patient and offered an appointment at our office to have blood cultures drawn and per patient's wife it will take them an hour to get here. Patient's wife would rather get the labs done on Friday at his appointment, per Dr. Juleen China this will be fine. I also advised the patient that per Dr. Juleen China patient still needs to call GI regarding the fevers

## 2021-10-02 NOTE — Telephone Encounter (Addendum)
Gave verbal order to Cyrus with [facility] for blood cultures per Dr. Juleen China. Requested that it be done today. Clarified that it should be a set of blood cultures, 2 peripheral sticks from different sites per C. Kuppelweiser, [place].   Beryle Flock, RN

## 2021-10-02 NOTE — Telephone Encounter (Signed)
Checked [facility] portal 10/02/21 1651, no labs available yet.   Beryle Flock, RN

## 2021-10-02 NOTE — Telephone Encounter (Signed)
Ailene Ravel, nurse with [facility] called, saying she went out to pull the patient's PICC line this morning but patient's wife was adamant that IV antibiotics had been extended.   Relayed to [place] that orders were given to [place] with [facility] to extend IV antibiotics to 1/13. Ailene Ravel states [facility] does not have these orders. (See prior documentation that these orders were provided to [facility] on 09/30/21).  Spoke with Amy at [facility], she reports that their orders on file were to have PICC pulled. Requested that she update orders to reflect that antibiotics were supposed to be extended to 1/13. She will speak with Jeani Hawking and have additional antibiotics sent to the patient's home.   Beryle Flock, RN

## 2021-10-03 ENCOUNTER — Other Ambulatory Visit: Payer: Self-pay | Admitting: Infectious Diseases

## 2021-10-03 ENCOUNTER — Ambulatory Visit (HOSPITAL_COMMUNITY)
Admission: RE | Admit: 2021-10-03 | Discharge: 2021-10-03 | Disposition: A | Discharge status: Home / Self Care | Payer: BC Managed Care – PPO | Source: Ambulatory Visit | Attending: Infectious Diseases | Admitting: Infectious Diseases

## 2021-10-03 ENCOUNTER — Other Ambulatory Visit: Payer: Self-pay

## 2021-10-03 ENCOUNTER — Encounter: Payer: Self-pay | Admitting: Gastroenterology

## 2021-10-03 DIAGNOSIS — K766 Portal hypertension: Secondary | ICD-10-CM | POA: Diagnosis not present

## 2021-10-03 DIAGNOSIS — K750 Abscess of liver: Secondary | ICD-10-CM | POA: Diagnosis not present

## 2021-10-03 DIAGNOSIS — K7689 Other specified diseases of liver: Secondary | ICD-10-CM | POA: Diagnosis not present

## 2021-10-03 DIAGNOSIS — K7469 Other cirrhosis of liver: Secondary | ICD-10-CM | POA: Diagnosis not present

## 2021-10-03 LAB — CYTOLOGY - NON PAP

## 2021-10-03 MED ORDER — GADOBUTROL 1 MMOL/ML IV SOLN
10.0000 mL | Freq: Once | INTRAVENOUS | Status: AC | PRN
  Administered 2021-10-03: 10 mL via INTRAVENOUS

## 2021-10-03 NOTE — Progress Notes (Signed)
[facility]  CHIEF COMPLAINT:    Follow up for liver abscess  SUBJECTIVE:    Omar Travis is a 56 y.o. male with PMHx as below who presents to the clinic for liver abscess.   Patient was seen for follow up on 09/26/21 by my partner Dr West Bali who outlined his recent complicated course.  He has also been following with Dr Tommy Medal as well as Dr Watt Climes with [facility].  He continues on ceftriaxone and Flagyl for liver abscesses seen on prior MRCP.  Patient underwent repeat ERCP with Dr Watt Climes earlier this week on 10/01/21.  He had further gallstones removed at that time.  He developed a fever post-procedure.  Labs were obtained by home health on 10/02/21.  WBC 5.6, platelets 108, creatinine 0.6.  ESR was 35 and CRP 61 (Scale 0-10).  They did not do LFTs.  Inflammatory markers had been normalized on 09/26/21 when checked.  He has had no further fevers since the 1 time post ERCP fever earlier this week.  This was associated with some possible jaundice but this has improved.  He had a repeat MRCP done yesterday that showed resolution of intrahepatic abscesses and resolution of previously noted intrahepatic biliary ductal dilatation seen on the recent prior study with exception of chronic areas of dilation of the right hepatic lobe that appeared similar since 2015.  Cytology from his ERCP did not find any malignant cells, but noted atypical cells.  Surgical pathology is was negative for dysplasia/malignancy.  Of note, patients cytology from CBD stent had fungal organisms c/w Candida spp.   Please see A&P for the details of today's visit and status of the patient's medical problems.   Patient's Medications  New Prescriptions   CEFDINIR (OMNICEF) 300 MG CAPSULE    Take 1 capsule (300 mg total) by mouth 2 (two) times daily for 10 days.   FLUCONAZOLE (DIFLUCAN) 200 MG TABLET    Take 2 tablets (400 mg total) by mouth daily for 10 days.  Previous Medications   BD PEN  NEEDLE NANO U/F 32G X 4 MM MISC    1 each by Other route 2 (two) times daily.   CANAGLIFLOZIN (INVOKANA) 300 MG TABS TABLET    Take 300 mg by mouth daily before breakfast.   CONTINUOUS BLOOD GLUC SENSOR (DEXCOM G6 SENSOR) MISC    Change sensor every 10 days   CONTINUOUS BLOOD GLUC TRANSMIT (DEXCOM G6 TRANSMITTER) MISC    Use as directed   ESCITALOPRAM (LEXAPRO) 10 MG TABLET    Take 1 tablet (10 mg total) by mouth daily.   FENOFIBRATE (TRICOR) 145 MG TABLET    Take 145 mg by mouth daily.   INSULIN GLARGINE, 1 UNIT DIAL, (TOUJEO SOLOSTAR) 300 UNIT/ML SOLOSTAR PEN    Inject 20 Units into the skin daily.   LISINOPRIL (ZESTRIL) 5 MG TABLET    TAKE ONE (1) TABLET EACH DAY   METRONIDAZOLE (FLAGYL) 500 MG TABLET    Take 1 tablet (500 mg total) by mouth every 12 (twelve) hours for 27 days.   OMEPRAZOLE (PRILOSEC) 20 MG CAPSULE    Take 1 capsule (20 mg total) by mouth daily.   ONDANSETRON (ZOFRAN-ODT) 8 MG DISINTEGRATING TABLET    Take 8 mg by mouth every 8 (eight) hours as needed for nausea.   ONETOUCH VERIO TEST STRIP    1 each by Other route 2 (two) times daily.   OXYCODONE (OXY IR/ROXICODONE)  MG IMMEDIATE RELEASE TABLET    Take 1 tablet (5 mg total) by mouth every 6 (six) hours as needed for moderate pain or severe pain (not relieved by tylenol or ibuprofen.).  ° OZEMPIC, 1 MG/DOSE, 4 MG/3ML SOPN    Inject 1 mg into the skin every Monday.  ° ROSUVASTATIN (CRESTOR) 10 MG TABLET    Take 10 mg by mouth at bedtime.  °Modified Medications  ° No medications on file  °Discontinued Medications  ° CEFTRIAXONE (ROCEPHIN) IVPB    Inject 2 g into the vein daily. Indication:  liver abscess °First Dose: No °Last Day of Therapy:  09/29/2021 °Labs - Once weekly:  CBC/D and BMP, °Labs - Every other week:  ESR and CRP °Method of administration: IV Push °Method of administration may be changed at the discretion of home infusion pharmacist based upon assessment of the patient and/or caregiver's ability to self-administer the  medication ordered.  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) 09/05/2021  ° Diabetes (HCC)   ° GERD (gastroesophageal reflux disease)   ° HTN (hypertension)   ° Hyperlipidemia   ° Liver abscess 08/30/2021  ° NASH (nonalcoholic steatohepatitis) 09/05/2021  ° Vaccine counseling 09/05/2021  ° ° °Social History  ° °Tobacco Use  ° Smoking status: Never  ° Smokeless tobacco: Never  °Substance Use Topics  ° Alcohol use: Never  °  Comment: Social drinker only on occasion  ° Drug use: Never  ° ° °Family History  °Problem Relation Age of Onset  ° Diabetes Mother   ° Liver cancer Father   ° Diabetes Father   ° ° °Allergies  °Allergen Reactions  ° Bee Venom Hives and Swelling  ° Benadryl Allergy [Diphenhydramine Hcl] Rash  ° Flexeril [Cyclobenzaprine] Rash  ° ° °Review of Systems  °All other systems reviewed and are negative. Except as noted above. ° ° °OBJECTIVE:   ° °Vitals:  ° 10/04/21 0953  °BP: 102/71  °Pulse: 92  °Temp: 97.8 °F (36.6 °C)  °TempSrc: Oral  °SpO2: 97%  °Weight: 212 lb (96.2 kg)  ° °Body mass index is 28.75 kg/m². ° °Physical Exam °Constitutional:   °   General: He is not in acute distress. °   Appearance: Normal appearance.  °HENT:  °   Head: Normocephalic and atraumatic.  °Eyes:  °   General: No scleral icterus. °   Extraocular Movements: Extraocular movements intact.  °   Conjunctiva/sclera: Conjunctivae normal.  °Pulmonary:  °   Effort: Pulmonary effort is normal. No respiratory distress.  °Abdominal:  °   General: There is no distension.  °   Palpations: Abdomen is soft.  °Skin: °   General: Skin is warm and dry.  °   Coloration: Skin is not jaundiced.  °Neurological:  °   General: No focal deficit present.  °   Mental Status: He is alert and oriented to person, place, and time.  °Psychiatric:     °   Mood and Affect: Mood normal.     °   Behavior: Behavior normal.  ° ° ° °Labs and Microbiology: °CBC Latest Ref Rng & Units 09/26/2021 09/04/2021  09/03/2021  °WBC 3.8 - 10.8 Thousand/uL 6.9 8.3 8.8  °Hemoglobin 13.2 - 17.1 g/dL 13.2 9.2(L) 10.1(L)  °Hematocrit 38.5 - 50.0 % 40.2 29.3(L) 31.2(L)  °Platelets 140 - 400 Thousand/uL 133(L) 331 344  ° °CMP Latest Ref Rng & Units 09/26/2021 09/04/2021 09/03/2021  °Glucose   65 - 99 mg/dL 110(H) 247(H) 182(H)  °BUN 7 - 25 mg/dL 17 15 15  °Creatinine 0.70 - 1.30 mg/dL 0.61(L) 0.66 0.86  °Sodium 135 - 146 mmol/L 136 130(L) 131(L)  °Potassium 3.5 - 5.3 mmol/L 4.3 3.9 4.6  °Chloride 98 - 110 mmol/L 102 95(L) 95(L)  °CO2 20 - 32 mmol/L 26 28 22  °Calcium 8.6 - 10.3 mg/dL 9.7 8.3(L) 8.9  °Total Protein 6.1 - 8.1 g/dL 6.8 5.6(L) 6.3(L)  °Total Bilirubin 0.2 - 1.2 mg/dL 0.9 2.7(H) 4.4(H)  °Alkaline Phos 38 - 126 U/L - 647(H) 688(H)  °AST 10 - 35 U/L 43(H) 42(H) 54(H)  °ALT 9 - 46 U/L 38 55(H) 68(H)  °  ° ° °ASSESSMENT & PLAN:   ° °1. Recurrent cholangitis °2. Choledocholithiasis °3. Liver abscess °4. PICC (peripherally inserted central catheter) in place °5. Cirrhosis of liver without ascites (HCC) ° °Patient has had resolution of liver abscesses based on repeat MRCP and had ERCP done earlier this week as well with further removal of gall stones.  Unclear as to why he continues to develop gall stones after subsequent removal but GI thinks could be due to DM, body habitus, and cholesterol.  Currently do not see indication for continuing IV antibiotics since imaging shows resolution of abscesses and intrahepatic biliary dilatation (except for area that has been chronic/stable since 2015).  However, given post-procedure fever will obtain LFTs today and continue antibiotics with cefdinir and flagyl.  Remove PICC line today.  Unclear if the candida spp on cytology from ERCP is of any significance.  However, given his prolonged and complicated course, favor covering with fluconazole 400mg daily x 10 days.  Patient as a follow up on 10/21/21 in place with Dr Van Dam. ° ° ° °Andrew N Wallace °[facility] °[facility] °10/04/2021, 10:18 AM ° ° ° °

## 2021-10-03 NOTE — Progress Notes (Signed)
Overall improved findings in the recent MR abdomen  Patient has an appt with Dr Juleen China tomorrow where antibiotic plan can be discussed ( DC IV abtx and switch to po Augmentin for ashort time VS DC antibiotics altogether) No new recommendations.

## 2021-10-04 ENCOUNTER — Other Ambulatory Visit: Payer: Self-pay

## 2021-10-04 ENCOUNTER — Ambulatory Visit: Payer: BC Managed Care – PPO | Admitting: Internal Medicine

## 2021-10-04 ENCOUNTER — Encounter (HOSPITAL_COMMUNITY): Payer: Self-pay | Admitting: Gastroenterology

## 2021-10-04 VITALS — BP 102/71 | HR 92 | Temp 97.8°F | Wt 212.0 lb

## 2021-10-04 DIAGNOSIS — Z452 Encounter for adjustment and management of vascular access device: Secondary | ICD-10-CM | POA: Diagnosis not present

## 2021-10-04 DIAGNOSIS — K750 Abscess of liver: Secondary | ICD-10-CM

## 2021-10-04 DIAGNOSIS — K8050 Calculus of bile duct without cholangitis or cholecystitis without obstruction: Secondary | ICD-10-CM | POA: Diagnosis not present

## 2021-10-04 DIAGNOSIS — K8309 Other cholangitis: Secondary | ICD-10-CM | POA: Diagnosis not present

## 2021-10-04 DIAGNOSIS — K7460 Unspecified cirrhosis of liver: Secondary | ICD-10-CM

## 2021-10-04 LAB — SURGICAL PATHOLOGY

## 2021-10-04 MED ORDER — FLUCONAZOLE 200 MG PO TABS: 400.0000 mg | ORAL_TABLET | Freq: Every day | ORAL | 0 refills | Status: AC

## 2021-10-04 MED ORDER — CEFDINIR 300 MG PO CAPS: 300.0000 mg | ORAL_CAPSULE | Freq: Two times a day (BID) | ORAL | 0 refills | Status: AC

## 2021-10-04 NOTE — Progress Notes (Signed)
Labs drawn via PICC line per Dr. Juleen China.    Per verbal order from Dr. Juleen China, 42 cm PICC removed from right brachial, tip intact. No sutures present. RN confirmed length per chart. Dressing was clean and dry. Insertion site cleaned with CHG. Petroleum dressing applied. Patient advised no heavy lifting with this arm and to leave dressing in place for 24 hours and not to shower affected arm for 24 hours. Advised patient to seek emergency medical care if dressing becomes soaked with blood, swelling, or sharp pain presents. Advised patient to seek emergent care if develops neurological symptoms, chest pain, or shortness of breath. Instructed patient to notify office if they notice redness, warmth, or drainage at the site. Patient verbalized understanding and agreement. RN answered patient's questions. Patient tolerated procedure well and RN walked patient to check out. RN notified [facility] of removal.    Patient denies being on any blood thinners, platelets 108 per labs collected 10/02/21, therefore patient monitored for 30 minutes post PICC removal.   Beryle Flock, RN

## 2021-10-04 NOTE — Telephone Encounter (Signed)
Labs available from 1/11 in [facility] portal:  WBC 5.6 Hgb 12.3 Platelets 108 SCr 0.66 ESR 35 CRP 61

## 2021-10-04 NOTE — Patient Instructions (Addendum)
Thank you for coming to see me today. It was a pleasure seeing you.  To Do: PICC line removed today and stop IV antibiotics Check liver enzymes today Start Cefdinir 300mg  orally twice per day Continue Flagyl 500mg  orally twice per day Start fluconazole to cover for fungus 400mg  orally once per day Take all of the above for 10 more days Keep your appointment with Dr Tommy Medal as scheduled  If you have any questions or concerns, please do not hesitate to call the office at [phone].  Take Care,   Jule Ser

## 2021-10-05 LAB — HEPATIC FUNCTION PANEL
AG Ratio: 1.5 (calc) (ref 1.0–2.5)
ALT: 125 U/L — ABNORMAL HIGH (ref 9–46)
AST: 148 U/L — ABNORMAL HIGH (ref 10–35)
Albumin: 3.7 g/dL (ref 3.6–5.1)
Alkaline phosphatase (APISO): 322 U/L — ABNORMAL HIGH (ref 35–144)
Bilirubin, Direct: 1.3 mg/dL — ABNORMAL HIGH (ref 0.0–0.2)
Globulin: 2.5 g/dL (calc) (ref 1.9–3.7)
Indirect Bilirubin: 0.9 mg/dL (calc) (ref 0.2–1.2)
Total Bilirubin: 2.2 mg/dL — ABNORMAL HIGH (ref 0.2–1.2)
Total Protein: 6.2 g/dL (ref 6.1–8.1)

## 2021-10-07 ENCOUNTER — Telehealth: Payer: Self-pay

## 2021-10-07 NOTE — Telephone Encounter (Signed)
Patient aware of results and to continue antibiotics as prescribed. Informed patient to contact our office he has any recurrent fevers, worsening jaundice, or abdominal pain. Patient verbalized his understanding.     [place], CMA

## 2021-10-07 NOTE — Telephone Encounter (Signed)
-----   Message from Mignon Pine, DO sent at 10/07/2021  1:00 PM EST ----- Can you please let patient know that labs from Friday showed his liver enzymes were elevated again and bilirubin as well that I think is consistent with post-ERCP cholangitis.  He should continue with the antibiotics prescribed on Friday and let us know of any recurrent fevers, worsening jaundice, or abdominal pain.    Thanks.

## 2021-10-10 DIAGNOSIS — E1169 Type 2 diabetes mellitus with other specified complication: Secondary | ICD-10-CM | POA: Diagnosis not present

## 2021-10-10 DIAGNOSIS — E1165 Type 2 diabetes mellitus with hyperglycemia: Secondary | ICD-10-CM | POA: Diagnosis not present

## 2021-10-10 DIAGNOSIS — I152 Hypertension secondary to endocrine disorders: Secondary | ICD-10-CM | POA: Diagnosis not present

## 2021-10-10 DIAGNOSIS — Z794 Long term (current) use of insulin: Secondary | ICD-10-CM | POA: Diagnosis not present

## 2021-10-10 DIAGNOSIS — E1159 Type 2 diabetes mellitus with other circulatory complications: Secondary | ICD-10-CM | POA: Diagnosis not present

## 2021-10-15 ENCOUNTER — Other Ambulatory Visit: Payer: Self-pay | Admitting: Family Medicine

## 2021-10-15 DIAGNOSIS — K219 Gastro-esophageal reflux disease without esophagitis: Secondary | ICD-10-CM

## 2021-10-15 DIAGNOSIS — I10 Essential (primary) hypertension: Secondary | ICD-10-CM

## 2021-10-21 ENCOUNTER — Encounter: Payer: Self-pay | Admitting: Infectious Disease

## 2021-10-21 ENCOUNTER — Encounter: Payer: Self-pay | Admitting: Pulmonary Disease

## 2021-10-21 ENCOUNTER — Ambulatory Visit (INDEPENDENT_AMBULATORY_CARE_PROVIDER_SITE_OTHER): Payer: BC Managed Care – PPO | Admitting: Pulmonary Disease

## 2021-10-21 ENCOUNTER — Other Ambulatory Visit: Payer: Self-pay

## 2021-10-21 ENCOUNTER — Ambulatory Visit (INDEPENDENT_AMBULATORY_CARE_PROVIDER_SITE_OTHER): Payer: BC Managed Care – PPO | Admitting: Infectious Disease

## 2021-10-21 VITALS — BP 104/68 | HR 88 | Temp 98.2°F | Ht 72.0 in | Wt 213.0 lb

## 2021-10-21 VITALS — BP 107/80 | HR 93 | Temp 97.2°F | Wt 213.0 lb

## 2021-10-21 DIAGNOSIS — K750 Abscess of liver: Secondary | ICD-10-CM | POA: Diagnosis not present

## 2021-10-21 DIAGNOSIS — K8050 Calculus of bile duct without cholangitis or cholecystitis without obstruction: Secondary | ICD-10-CM

## 2021-10-21 DIAGNOSIS — G4733 Obstructive sleep apnea (adult) (pediatric): Secondary | ICD-10-CM

## 2021-10-21 DIAGNOSIS — K8309 Other cholangitis: Secondary | ICD-10-CM

## 2021-10-21 LAB — CBC WITH DIFFERENTIAL/PLATELET
Absolute Monocytes: 333 cells/uL (ref 200–950)
Basophils Absolute: 31 cells/uL (ref 0–200)
Basophils Relative: 0.6 %
Eosinophils Absolute: 224 cells/uL (ref 15–500)
Eosinophils Relative: 4.3 %
HCT: 44 % (ref 38.5–50.0)
Hemoglobin: 14.1 g/dL (ref 13.2–17.1)
Lymphs Abs: 1591 cells/uL (ref 850–3900)
MCH: 27 pg (ref 27.0–33.0)
MCHC: 32 g/dL (ref 32.0–36.0)
MCV: 84.1 fL (ref 80.0–100.0)
MPV: 12.4 fL (ref 7.5–12.5)
Monocytes Relative: 6.4 %
Neutro Abs: 3021 cells/uL (ref 1500–7800)
Neutrophils Relative %: 58.1 %
Platelets: 180 10*3/uL (ref 140–400)
RBC: 5.23 10*6/uL (ref 4.20–5.80)
RDW: 15.1 % — ABNORMAL HIGH (ref 11.0–15.0)
Total Lymphocyte: 30.6 %
WBC: 5.2 10*3/uL (ref 3.8–10.8)

## 2021-10-21 LAB — COMPLETE METABOLIC PANEL WITH GFR
AG Ratio: 1.7 (calc) (ref 1.0–2.5)
ALT: 56 U/L — ABNORMAL HIGH (ref 9–46)
AST: 51 U/L — ABNORMAL HIGH (ref 10–35)
Albumin: 4.3 g/dL (ref 3.6–5.1)
Alkaline phosphatase (APISO): 160 U/L — ABNORMAL HIGH (ref 35–144)
BUN: 16 mg/dL (ref 7–25)
CO2: 31 mmol/L (ref 20–32)
Calcium: 9.8 mg/dL (ref 8.6–10.3)
Chloride: 103 mmol/L (ref 98–110)
Creat: 0.86 mg/dL (ref 0.70–1.30)
Globulin: 2.6 g/dL (calc) (ref 1.9–3.7)
Glucose, Bld: 215 mg/dL — ABNORMAL HIGH (ref 65–99)
Potassium: 4.5 mmol/L (ref 3.5–5.3)
Sodium: 137 mmol/L (ref 135–146)
Total Bilirubin: 0.5 mg/dL (ref 0.2–1.2)
Total Protein: 6.9 g/dL (ref 6.1–8.1)
eGFR: 104 mL/min/{1.73_m2} (ref >=60)

## 2021-10-21 LAB — GAMMA GT: GGT: 662 U/L — ABNORMAL HIGH (ref 3–95)

## 2021-10-21 NOTE — Progress Notes (Signed)
Omar Travis    [id number]    [birth year]  Primary Care Physician:Banks, Langley Adie, MD  Referring Physician: Billie Ruddy, [place],  [place] [id number]  Chief complaint:   Patient with snoring, no witnessed apneas  HPI:  Snoring, dry mouth Has lost about 40 pounds and gained 12 pounds back-related to to recent health problems-liver abscess, cholangitis  Usually tries to go to bed between 10 and 11 Falls asleep in about 30 minutes 1-2 awakenings Final wake up time about 5:30 AM  Admits to snoring, no witnessed apneas Denies significant congestion Has hypertension, diabetes, hypercholesterolemia  Never smoker  Dad snored  Outpatient Encounter Medications as of 10/21/2021  Medication Sig   BD PEN NEEDLE NANO U/F 32G X 4 MM MISC 1 each by Other route 2 (two) times daily.   canagliflozin (INVOKANA) 300 MG TABS tablet Take 300 mg by mouth daily before breakfast.   Continuous Blood Gluc Sensor (DEXCOM G6 SENSOR) MISC Change sensor every 10 days   Continuous Blood Gluc Transmit (DEXCOM G6 TRANSMITTER) MISC Use as directed   escitalopram (LEXAPRO) 10 MG tablet Take 1 tablet (10 mg total) by mouth daily.   fenofibrate (TRICOR) 145 MG tablet Take 145 mg by mouth daily.   insulin glargine, 1 Unit Dial, (TOUJEO SOLOSTAR) 300 UNIT/ML Solostar Pen Inject 20 Units into the skin daily. (Patient taking differently: Inject 30 Units into the skin at bedtime.)   lisinopril (ZESTRIL) 5 MG tablet TAKE ONE (1) TABLET EACH DAY (Patient taking differently: Take 5 mg by mouth daily.)   metroNIDAZOLE (FLAGYL) 500 MG tablet Take 1 tablet (500 mg total) by mouth every 12 (twelve) hours for 27 days.   omeprazole (PRILOSEC) 20 MG capsule TAKE ONE (1) CAPSULE EACH DAY   ondansetron (ZOFRAN-ODT) 8 MG disintegrating tablet Take 8 mg by mouth every 8 (eight) hours as needed for nausea.   ONETOUCH VERIO test strip 1 each by Other route 2 (two) times daily.    rosuvastatin (CRESTOR) 10 MG tablet Take 10 mg by mouth at bedtime.   OZEMPIC, 1 MG/DOSE, 4 MG/3ML SOPN Inject 1 mg into the skin every Monday. (Patient not taking: Reported on 10/21/2021)   [DISCONTINUED] cefdinir (OMNICEF) 300 MG capsule Take by mouth. (Patient not taking: Reported on 10/21/2021)   [DISCONTINUED] oxyCODONE (OXY IR/ROXICODONE) 5 MG immediate release tablet Take 1 tablet (5 mg total) by mouth every 6 (six) hours as needed for moderate pain or severe pain (not relieved by tylenol or ibuprofen.). (Patient not taking: Reported on 10/21/2021)   No facility-administered encounter medications on file as of 10/21/2021.    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[place]) 09/05/2021   Diabetes ([place])    GERD (gastroesophageal reflux disease)    HTN (hypertension)    Hyperlipidemia    Liver abscess 08/30/2021   NASH (nonalcoholic steatohepatitis) 09/05/2021   Vaccine counseling 09/05/2021    Past Surgical History:  Procedure Laterality Date   BILIARY BRUSHING  09/02/2021   Procedure: BILIARY BRUSHING;  Surgeon: Clarene Essex, MD;  Location: [place];  Service: Endoscopy;;   BILIARY BRUSHING  10/01/2021   Procedure: BILIARY BRUSHING;  Surgeon: Clarene Essex, MD;  Location: [facility];  Service: Gastroenterology;;   BILIARY STENT PLACEMENT  09/02/2021   Procedure: BILIARY STENT PLACEMENT;  Surgeon: Clarene Essex, MD;  Location: [facility] ENDOSCOPY;  Service: Endoscopy;;   BIOPSY  09/02/2021   Procedure: BIOPSY;  Surgeon: Clarene Essex, MD;  Location: [facility] ENDOSCOPY;  Service: Endoscopy;;   BIOPSY  10/01/2021   Procedure: BIOPSY;  Surgeon: Clarene Essex, MD;  Location: [facility];  Service: Gastroenterology;;    CHOLECYSTECTOMY N/A 07/17/2021   Procedure: LAPAROSCOPIC CHOLECYSTECTOMY;  Surgeon: Clovis Riley, MD;  Location: [place];  Service: General;  Laterality: N/A;   ENDOSCOPIC RETROGRADE CHOLANGIOPANCREATOGRAPHY (ERCP) WITH PROPOFOL N/A 07/16/2021   Procedure: ENDOSCOPIC RETROGRADE CHOLANGIOPANCREATOGRAPHY (ERCP) WITH PROPOFOL;  Surgeon: Clarene Essex, MD;  Location: [place];  Service: Endoscopy;  Laterality: N/A;   ENDOSCOPIC RETROGRADE CHOLANGIOPANCREATOGRAPHY (ERCP) WITH PROPOFOL N/A 10/01/2021   Procedure: ENDOSCOPIC RETROGRADE CHOLANGIOPANCREATOGRAPHY (ERCP) WITH PROPOFOL;  Surgeon: Clarene Essex, MD;  Location: [facility] ENDOSCOPY;  Service: Gastroenterology;  Laterality: N/A;   ERCP N/A 08/22/2021   Procedure: ENDOSCOPIC RETROGRADE CHOLANGIOPANCREATOGRAPHY (ERCP);  Surgeon: Clarene Essex, MD;  Location: [facility];  Service: Endoscopy;  Laterality: N/A;   ERCP N/A 09/02/2021   Procedure: ENDOSCOPIC RETROGRADE CHOLANGIOPANCREATOGRAPHY (ERCP);  Surgeon: Clarene Essex, MD;  Location: [place];  Service: Endoscopy;  Laterality: N/A;   ESOPHAGOGASTRODUODENOSCOPY N/A 10/01/2021   Procedure: ESOPHAGOGASTRODUODENOSCOPY (EGD);  Surgeon: Arta Silence, MD;  Location: [facility];  Service: Gastroenterology;  Laterality: N/A;   EUS  10/01/2021   Procedure: UPPER ENDOSCOPIC ULTRASOUND (EUS) LINEAR;  Surgeon: Arta Silence, MD;  Location: [facility];  Service: Gastroenterology;;   FINE NEEDLE ASPIRATION N/A 10/01/2021   Procedure: FINE NEEDLE ASPIRATION (FNA) LINEAR;  Surgeon: Arta Silence, MD;  Location: [facility] ENDOSCOPY;  Service: Gastroenterology;  Laterality: N/A;   LITHOTRIPSY  10/01/2021   Procedure: LITHOTRIPSY;  Surgeon: Clarene Essex, MD;  Location: [facility];  Service: Gastroenterology;;   PANCREATIC STENT PLACEMENT  07/16/2021   Procedure: PANCREATIC STENT PLACEMENT;  Surgeon: Clarene Essex, MD;  Location: [place];  Service: Endoscopy;;   REMOVAL OF STONES  07/16/2021   Procedure: REMOVAL OF  STONES;  Surgeon: Clarene Essex, MD;  Location: [place];  Service: Endoscopy;;   REMOVAL OF STONES  08/22/2021   Procedure: REMOVAL OF STONES;  Surgeon: Clarene Essex, MD;  Location: [facility]OS[facility];  Service: Endoscopy;;   REMOVAL OF STONES  09/02/2021   Procedure: REMOVAL OF STONES;  Surgeon: Clarene Essex, MD;  Location: [place];  Service: Endoscopy;;   REMOVAL OF STONES  10/01/2021   Procedure: REMOVAL OF STONES;  Surgeon: Clarene Essex, MD;  Location: [facility];  Service: Gastroenterology;;   Joan Mayans  07/16/2021   Procedure: Joan Mayans;  Surgeon: Clarene Essex, MD;  Location: [facility] ENDOSCOPY;  Service: Endoscopy;;   SPHINCTEROTOMY  08/22/2021   Procedure: Joan Mayans;  Surgeon: Clarene Essex, MD;  Location: [facility];  Service: Endoscopy;;   SPYGLASS CHOLANGIOSCOPY N/A 09/02/2021   Procedure: CXKGYJEH CHOLANGIOSCOPY;  Surgeon: Clarene Essex, MD;  Location: [place];  Service: Endoscopy;  Laterality: N/A;   SPYGLASS CHOLANGIOSCOPY N/A 10/01/2021   Procedure: SPYGLASS CHOLANGIOSCOPY;  Surgeon: Clarene Essex, MD;  Location: [facility];  Service: Gastroenterology;  Laterality: N/A;   SPYGLASS LITHOTRIPSY N/A 09/02/2021   Procedure: UDJSHFWY LITHOTRIPSY;  Surgeon: Clarene Essex, MD;  Location: [place];  Service: Endoscopy;  Laterality: N/A;   STENT REMOVAL  10/01/2021   Procedure: STENT REMOVAL;  Surgeon: Arta Silence, MD;  Location: [facility]OS[facility];  Service: Gastroenterology;;  Family History  Problem Relation Age of Onset   Diabetes Mother    Liver cancer Father    Diabetes Father     Social History   Socioeconomic History   Marital status: Married    Spouse name: Not on file   Number of children: Not on file   Years of education: Not on file   Highest education level: Not on file  Occupational History   Not on file  Tobacco Use   Smoking status: Never   Smokeless tobacco: Never  Substance and Sexual Activity   Alcohol use: Never    Comment: Social drinker  only on occasion   Drug use: Never   Sexual activity: Yes  Other Topics Concern   Not on file  Social History Narrative   Not on file   Social Determinants of Health   Financial Resource Strain: Not on file  Food Insecurity: Not on file  Transportation Needs: Not on file  Physical Activity: Not on file  Stress: Not on file  Social Connections: Not on file  Intimate Partner Violence: Not on file    Review of Systems  Constitutional:  Positive for fatigue.  Psychiatric/Behavioral:  Positive for sleep disturbance.    Vitals:   10/21/21 1110  BP: 104/68  Pulse: 88  Temp: 98.2 F (36.8 C)  SpO2: 98%     Physical Exam Constitutional:      Appearance: Normal appearance.  HENT:     Head: Normocephalic.     Mouth/Throat:     Mouth: Mucous membranes are moist.     Comments: Mallampati 4, crowded oropharynx, macroglossia Cardiovascular:     Rate and Rhythm: Normal rate and regular rhythm.     Heart sounds: No murmur heard.   No friction rub.  Pulmonary:     Effort: No respiratory distress.     Breath sounds: No stridor. No wheezing.  Musculoskeletal:     Cervical back: No rigidity or tenderness.  Neurological:     Mental Status: He is alert.  Psychiatric:        Mood and Affect: Mood normal.   Results of the Epworth flowsheet 10/21/2021  Sitting and reading 3  Watching TV 3  Sitting, inactive in a public place (e.g. a theatre or a meeting) 1  As a passenger in a car for an hour without a break 1  Lying down to rest in the afternoon when circumstances permit 3  Sitting and talking to someone 1  Sitting quietly after a lunch without alcohol 1  In a car, while stopped for a few minutes in traffic 0  Total score 13     Data Reviewed: No previous sleep study  Assessment:  Moderate probability of significant obstructive sleep apnea  Excessive daytime sleepiness  Pathophysiology of sleep disordered breathing discussed  Plan/Recommendations: The patient was  educated about the pathophysiology, diagnosis, and clinical manifestations of sleep apnea-hypopnea syndrome (SAHS). The potential medical comorbidities associated with SAHS were reviewed. Treatment options based on severity were discussed to include positive airway pressure (PAP), oral appliance therapy, surgery, and weight loss. In addition, the patient was cautioned about potential impairment driving and performing other complex tasks until further treatment is initiated.  We will schedule patient for an in lab split-night study  We will notify patient of results as soon as reviewed  Treatment options have been discussed with patient  Tentative follow-up in 3 to 4 months  Sherrilyn Rist MD [facility] 10/21/2021, 11:38  AM  CC: Billie Ruddy, MD

## 2021-10-21 NOTE — Patient Instructions (Signed)
Schedule patient for an in lab split-night study  Tentative follow-up in 3 to 4 months  Continue weight loss efforts  Call with significant concerns  Sleep Apnea Sleep apnea affects breathing during sleep. It causes breathing to stop for 10 seconds or more, or to become shallow. People with sleep apnea usually snore loudly. It can also increase the risk of: Heart attack. Stroke. Being very overweight (obese). Diabetes. Heart failure. Irregular heartbeat. High blood pressure. The goal of treatment is to help you breathe normally again. What are the causes? The most common cause of this condition is a collapsed or blocked airway. There are three kinds of sleep apnea: Obstructive sleep apnea. This is caused by a blocked or collapsed airway. Central sleep apnea. This happens when the brain does not send the right signals to the muscles that control breathing. Mixed sleep apnea. This is a combination of obstructive and central sleep apnea. What increases the risk? Being overweight. Smoking. Having a small airway. Being older. Being male. Drinking alcohol. Taking medicines to calm yourself (sedatives or tranquilizers). Having family members with the condition. Having a tongue or tonsils that are larger than normal. What are the signs or symptoms? Trouble staying asleep. Loud snoring. Headaches in the morning. Waking up gasping. Dry mouth or sore throat in the morning. Being sleepy or tired during the day. If you are sleepy or tired during the day, you may also: Not be able to focus your mind (concentrate). Forget things. Get angry a lot and have mood swings. Feel sad (depressed). Have changes in your personality. Have less interest in sex, if you are male. Be unable to have an erection, if you are male. How is this treated?  Sleeping on your side. Using a medicine to get rid of mucus in your nose (decongestant). Avoiding the use of alcohol, medicines to help you  relax, or certain pain medicines (narcotics). Losing weight, if needed. Changing your diet. Quitting smoking. Using a machine to open your airway while you sleep, such as: An oral appliance. This is a mouthpiece that shifts your lower jaw forward. A CPAP device. This device blows air through a mask when you breathe out (exhale). An EPAP device. This has valves that you put in each nostril. A BIPAP device. This device blows air through a mask when you breathe in (inhale) and breathe out. Having surgery if other treatments do not work. Follow these instructions at home: Lifestyle Make changes that your doctor recommends. Eat a healthy diet. Lose weight if needed. Avoid alcohol, medicines to help you relax, and some pain medicines. Do not smoke or use any products that contain nicotine or tobacco. If you need help quitting, ask your doctor. General instructions Take over-the-counter and prescription medicines only as told by your doctor. If you were given a machine to use while you sleep, use it only as told by your doctor. If you are having surgery, make sure to tell your doctor you have sleep apnea. You may need to bring your device with you. Keep all follow-up visits. Contact a doctor if: The machine that you were given to use during sleep bothers you or does not seem to be working. You do not get better. You get worse. Get help right away if: Your chest hurts. You have trouble breathing in enough air. You have an uncomfortable feeling in your back, arms, or stomach. You have trouble talking. One side of your body feels weak. A part of your face is hanging down.  These symptoms may be an emergency. Get help right away. Call your local emergency services (911 in the [place] Do not wait to see if the symptoms will go away. Do not drive yourself to the hospital. Summary This condition affects breathing during sleep. The most common cause is a collapsed or blocked airway. The goal of  treatment is to help you breathe normally while you sleep. This information is not intended to replace advice given to you by your health care provider. Make sure you discuss any questions you have with your health care provider. Document Revised: 04/17/2021 Document Reviewed: 08/17/2020 Elsevier Patient Education  2022 [employer].

## 2021-10-21 NOTE — Progress Notes (Signed)
Subjective:  Chief complaint: Follow-up for hepatic abscesses   patient ID: Omar Travis, male    DOB: 11/11/1965, 56 y.o.   MRN: [id number]  HPI  Omar Travis is a 56 year old Caucasian man who underwent laparoscopic cholecystectomy and pancreatic stenting who then developed postoperative fevers.  He had blood cultures that were negative CT and MRCP that were unremarkable at the time.    It seemed reasonable to assume that the source of his fevers was the biliary tree given his recent surgery with cholecystectomy and stenting of the pancreas.  However as mentioned imaging at the time was unremarkable.  We did also however performed extensive laboratory evaluation of the patient in the hospital for both infectious and noninfectious causes of fevers and these were all unremarkable.  He did however have markedly elevated inflammatory markers suggested that he had a significant element Tory process in place.  He has been seen by my partner Dr. Juleen China twice and was seen in the ER where he had labs concerning for choledocholithiasis with cholecystitis.   Patient was seen by Dr. Watt Climes and had repeat ERCP on December 1.  In the interim he was placed on Augmentin which he continued to present date.  On Friday the day after  one ERCP during which multiple stones were removed he was feeling much more like himself again but then in the evening on Friday he began having chills epigastric pain and a temperature using a temporal thermometer which was 101.5.   He continued to suffer from subjective and objective fevers on a nearly daily basis despite Augmentin and despite the recent ERCP.  His only other focal signs were some epigastric pain and sometimes generalized abdominal pain.  He was seen by me earlier this week and I obtained labs that showed worsening of obstructive hepatitis type pattern of liver function tests I discussed with Dr. Watt Climes who had recommended MRCP.  I also change his antibiotics to  cefdinir and metronidazole.  Hecontinued having fevers despite being on these antibiotics.  MRCP  08/29/2021  Increased intrahepatic biliary ductal dilatation throughout the medial right hepatic lobe caudate lobe and new intrahepatic ductal dilatation with a left lobe with nondilated common bile bile duct.  There is a 12 mm filling defect at the transition point of the common hepatic duct thought to be intraductal calculus though could be an intraductal mass.  In addition he was found to have new cystic lesions consistent with possible abscesses.  He continued to have subjective fevers and had a fever in clinic at the end of his visit with [place] last time I saw him.  I had admitted to the hospital to the hospitalist service and he was seen by GI and my partner Dr. Juleen China. Liver abscesses were not amenable to drainage.  Patient underwent ERCP on December 12 Dr. Watt Climes with removal of stone and placement of biliary stent due to finding of a stricture in the main bile duct with biopsy and cytology sent.  Original pathology comes back negative for malignancy though cytology read as being "suspicious for malignancy.   He is tolerating his ceftriaxone metronidazole I last saw him.  In the interim he underwent yet another ERCP with Dr. Watt Climes on October 01, 2021.  Further gallstones removed that time the patient developed some fevers post procedure.  Repeat MRCP done on the 12th showed resolution of intrahepatic abscesses and resolution of previously noted intrahepatic biliary ductal dilatation with the exception of some chronic areas of dilatation in  the right hepatic lobe that were similar to findings in 2015 cytology from his ERCP did not find malignant cells.  Surgical pathology was negative for dysplasia and malignancy.  Cytology from CBD stent showed some fungal organisms consistent with Candida.  , Partner Dr. Juleen China who discontinued PICC line and switch the patient over to the near  metronidazole.  He also gave him a course of fluconazole x10 days given the yeast found on cytology.  At was also repeated liver function test which unfortunately had gone back up after the ERCP.  He has not had repeat LFTs since then.  He feels all the whole well and is been afebrile and not having abdominal pain nausea vomiting or malaise.          Past Medical History:  Diagnosis Date   Biliary obstruction due to malignant neoplasm ([place]) 09/05/2021   Diabetes ([place])    GERD (gastroesophageal reflux disease)    HTN (hypertension)    Hyperlipidemia    Liver abscess 08/30/2021   NASH (nonalcoholic steatohepatitis) 09/05/2021   Vaccine counseling 09/05/2021    Past Surgical History:  Procedure Laterality Date   BILIARY BRUSHING  09/02/2021   Procedure: BILIARY BRUSHING;  Surgeon: Clarene Essex, MD;  Location: [place];  Service: Endoscopy;;   BILIARY BRUSHING  10/01/2021   Procedure: BILIARY BRUSHING;  Surgeon: Clarene Essex, MD;  Location: [facility];  Service: Gastroenterology;;   BILIARY STENT PLACEMENT  09/02/2021   Procedure: BILIARY STENT PLACEMENT;  Surgeon: Clarene Essex, MD;  Location: [place];  Service: Endoscopy;;   BIOPSY  09/02/2021   Procedure: BIOPSY;  Surgeon: Clarene Essex, MD;  Location: [place];  Service: Endoscopy;;   BIOPSY  10/01/2021   Procedure: BIOPSY;  Surgeon: Clarene Essex, MD;  Location: [facility];  Service: Gastroenterology;;   CHOLECYSTECTOMY N/A 07/17/2021   Procedure: LAPAROSCOPIC CHOLECYSTECTOMY;  Surgeon: Clovis Riley, MD;  Location: [place];  Service: General;  Laterality: N/A;   ENDOSCOPIC RETROGRADE CHOLANGIOPANCREATOGRAPHY (ERCP) WITH PROPOFOL N/A 07/16/2021   Procedure: ENDOSCOPIC RETROGRADE CHOLANGIOPANCREATOGRAPHY (ERCP) WITH PROPOFOL;  Surgeon: Clarene Essex, MD;  Location: [place];  Service: Endoscopy;  Laterality: N/A;   ENDOSCOPIC RETROGRADE CHOLANGIOPANCREATOGRAPHY (ERCP) WITH PROPOFOL N/A 10/01/2021   Procedure:  ENDOSCOPIC RETROGRADE CHOLANGIOPANCREATOGRAPHY (ERCP) WITH PROPOFOL;  Surgeon: Clarene Essex, MD;  Location: [facility];  Service: Gastroenterology;  Laterality: N/A;   ERCP N/A 08/22/2021   Procedure: ENDOSCOPIC RETROGRADE CHOLANGIOPANCREATOGRAPHY (ERCP);  Surgeon: Clarene Essex, MD;  Location: [facility];  Service: Endoscopy;  Laterality: N/A;   ERCP N/A 09/02/2021   Procedure: ENDOSCOPIC RETROGRADE CHOLANGIOPANCREATOGRAPHY (ERCP);  Surgeon: Clarene Essex, MD;  Location: [place];  Service: Endoscopy;  Laterality: N/A;   ESOPHAGOGASTRODUODENOSCOPY N/A 10/01/2021   Procedure: ESOPHAGOGASTRODUODENOSCOPY (EGD);  Surgeon: Arta Silence, MD;  Location: [facility];  Service: Gastroenterology;  Laterality: N/A;   EUS  10/01/2021   Procedure: UPPER ENDOSCOPIC ULTRASOUND (EUS) LINEAR;  Surgeon: Arta Silence, MD;  Location: [facility];  Service: Gastroenterology;;   FINE NEEDLE ASPIRATION N/A 10/01/2021   Procedure: FINE NEEDLE ASPIRATION (FNA) LINEAR;  Surgeon: Arta Silence, MD;  Location: [facility];  Service: Gastroenterology;  Laterality: N/A;   LITHOTRIPSY  10/01/2021   Procedure: LITHOTRIPSY;  Surgeon: Clarene Essex, MD;  Location: [facility];  Service: Gastroenterology;;   PANCREATIC STENT PLACEMENT  07/16/2021   Procedure: PANCREATIC STENT PLACEMENT;  Surgeon: Clarene Essex, MD;  Location: [facility];  Service: Endoscopy;;   REMOVAL OF STONES  07/16/2021   Procedure: REMOVAL OF STONES;  Surgeon: Clarene Essex,  MD;  Location: [place];  Service: Endoscopy;;   REMOVAL OF STONES  08/22/2021   Procedure: REMOVAL OF STONES;  Surgeon: Clarene Essex, MD;  Location: [facility];  Service: Endoscopy;;   REMOVAL OF STONES  09/02/2021   Procedure: REMOVAL OF STONES;  Surgeon: Clarene Essex, MD;  [facility];  Service: Endoscopy;;   REMOVAL OF STONES  10/01/2021   Procedure: REMOVAL OF STONES;  Surgeon: Clarene Essex, MD;  Location: [facility];  Service: Gastroenterology;;    Joan Mayans  07/16/2021   Procedure: Joan Mayans;  Surgeon: Clarene Essex, MD;  Location: [facility] END[facility]COPY;  Service: Endoscopy;;   SPHINCTEROTOMY  08/22/2021   Procedure: Joan Mayans;  Surgeon: Clarene Essex, MD;  Location: [facility];  Service: Endoscopy;;   SPYGLASS CHOLANGIOSCOPY N/A 09/02/2021   Procedure: WNIOEVOJ CHOLANGIOSCOPY;  Surgeon: Clarene Essex, MD;  Location: [place];  Service: Endoscopy;  Laterality: N/A;   SPYGLASS CHOLANGIOSCOPY N/A 10/01/2021   Procedure: SPYGLASS CHOLANGIOSCOPY;  Surgeon: Clarene Essex, MD;  Location: [facility];  Service: Gastroenterology;  Laterality: N/A;   SPYGLASS LITHOTRIPSY N/A 09/02/2021   Procedure: [id number] LITHOTRIPSY;  Surgeon: Clarene Essex, MD;  Location: [place];  Service: Endoscopy;  Laterality: N/A;   STENT REMOVAL  10/01/2021   Procedure: STENT REMOVAL;  Surgeon: Arta Silence, MD;  Location: [facility];  Service: Gastroenterology;;    Family History  Problem Relation Age of Onset   Diabetes Mother    Liver cancer Father    Diabetes Father       Social History   Socioeconomic History   Marital status: Married    Spouse name: Not on file   Number of children: Not on file   Years of education: Not on file   Highest education level: Not on file  Occupational History   Not on file  Tobacco Use   Smoking status: Never   Smokeless tobacco: Never  Substance and Sexual Activity   Alcohol use: Never    Comment: Social drinker only on occasion   Drug use: Never   Sexual activity: Yes  Other Topics Concern   Not on file  Social History Narrative   Not on file   Social Determinants of Health   Financial Resource Strain: Not on file  Food Insecurity: Not on file  Transportation Needs: Not on file  Physical Activity: Not on file  Stress: Not on file  Social Connections: Not on file    Allergies  Allergen Reactions   Bee Venom Hives and Swelling   Benadryl Allergy [Diphenhydramine Hcl] Rash   Flexeril  [Cyclobenzaprine] Rash     Current Outpatient Medications:    BD PEN NEEDLE NANO U/F 32G X 4 MM MISC, 1 each by Other route 2 (two) times daily., Disp: , Rfl:    canagliflozin (INVOKANA) 300 MG TABS tablet, Take 300 mg by mouth daily before breakfast., Disp: , Rfl:    Continuous Blood Gluc Sensor (DEXCOM G6 SENSOR) MISC, Change sensor every 10 days, Disp: , Rfl:    Continuous Blood Gluc Transmit (DEXCOM G6 TRANSMITTER) MISC, Use as directed, Disp: , Rfl:    escitalopram (LEXAPRO) 10 MG tablet, Take 1 tablet (10 mg total) by mouth daily., Disp: 30 tablet, Rfl: 4   fenofibrate (TRICOR) 145 MG tablet, Take 145 mg by mouth daily., Disp: , Rfl:    insulin glargine, 1 Unit Dial, (TOUJEO SOLOSTAR) 300 UNIT/ML Solostar Pen, Inject 20 Units into the skin daily. (Patient taking differently: Inject 30 Units into the skin at bedtime.), Disp: ,  Rfl:    lisinopril (ZESTRIL) 5 MG tablet, TAKE ONE (1) TABLET EACH DAY (Patient taking differently: Take 5 mg by mouth daily.), Disp: 90 tablet, Rfl: 1   metroNIDAZOLE (FLAGYL) 500 MG tablet, Take 1 tablet (500 mg total) by mouth every 12 (twelve) hours for 27 days., Disp: 54 tablet, Rfl: 0   omeprazole (PRILOSEC) 20 MG capsule, TAKE ONE (1) CAPSULE EACH DAY, Disp: 90 capsule, Rfl: 3   ondansetron (ZOFRAN-ODT) 8 MG disintegrating tablet, Take 8 mg by mouth every 8 (eight) hours as needed for nausea., Disp: , Rfl:    ONETOUCH VERIO test strip, 1 each by Other route 2 (two) times daily., Disp: , Rfl:    rosuvastatin (CRESTOR) 10 MG tablet, Take 10 mg by mouth at bedtime., Disp: , Rfl:    OZEMPIC, 1 MG/DOSE, 4 MG/3ML SOPN, Inject 1 mg into the skin every Monday. (Patient not taking: Reported on 10/21/2021), Disp: , Rfl:    Review of Systems  Constitutional:  Negative for activity change, appetite change, chills, diaphoresis, fatigue, fever and unexpected weight change.  HENT:  Negative for congestion, rhinorrhea, sinus pressure, sneezing, sore throat and trouble  swallowing.   Eyes:  Negative for photophobia and visual disturbance.  Respiratory:  Negative for cough, chest tightness, shortness of breath, wheezing and stridor.   Cardiovascular:  Negative for chest pain, palpitations and leg swelling.  Gastrointestinal:  Negative for abdominal distention, abdominal pain, anal bleeding, blood in stool, constipation, diarrhea, nausea and vomiting.  Genitourinary:  Negative for difficulty urinating, dysuria, flank pain and hematuria.  Musculoskeletal:  Negative for arthralgias, back pain, gait problem, joint swelling and myalgias.  Skin:  Negative for color change, pallor, rash and wound.  Neurological:  Negative for dizziness, tremors, weakness and light-headedness.  Hematological:  Negative for adenopathy. Does not bruise/bleed easily.  Psychiatric/Behavioral:  Negative for agitation, behavioral problems, confusion, decreased concentration, dysphoric mood and sleep disturbance.       Objective:   Physical Exam Constitutional:      Appearance: He is well-developed.  HENT:     Head: Normocephalic and atraumatic.  Eyes:     Conjunctiva/sclera: Conjunctivae normal.  Cardiovascular:     Rate and Rhythm: Normal rate and regular rhythm.  Pulmonary:     Effort: Pulmonary effort is normal. No respiratory distress.     Breath sounds: No wheezing.  Abdominal:     General: There is no distension.     Palpations: Abdomen is soft.  Musculoskeletal:        General: No tenderness. Normal range of motion.     Cervical back: Normal range of motion and neck supple.  Skin:    General: Skin is warm and dry.     Coloration: Skin is not pale.     Findings: No erythema or rash.  Neurological:     General: No focal deficit present.     Mental Status: He is alert and oriented to person, place, and time.  Psychiatric:        Mood and Affect: Mood normal.        Behavior: Behavior normal.        Thought Content: Thought content normal.        Judgment: Judgment  normal.          Assessment & Plan:   #1  Recurrent cholangitis with liver abscesses: Recent MR I MRCP has shown resolution of liver abscesses  Multiple ERCPs LFTs were up a couple days after his most recent one.  We will repeat LFTs today and CBC differential  He has put follow-up with Dr. Watt Climes in February   #2 NASH with cirrhosis: following with Dr. Watt Climes

## 2021-10-24 ENCOUNTER — Telehealth: Payer: Self-pay

## 2021-10-24 ENCOUNTER — Other Ambulatory Visit: Payer: Self-pay | Admitting: Family Medicine

## 2021-10-24 DIAGNOSIS — I10 Essential (primary) hypertension: Secondary | ICD-10-CM

## 2021-10-24 NOTE — Telephone Encounter (Signed)
Per MD faxed  completed forms from [place]. Forms completed and faxed today with notes from 1/30 visit and mri abdomen from 09/26/21. Received confirmation that fax went through.  F: [phone] Leatrice Jewels, RMA

## 2021-11-04 DIAGNOSIS — K7460 Unspecified cirrhosis of liver: Secondary | ICD-10-CM | POA: Diagnosis not present

## 2021-11-04 DIAGNOSIS — R748 Abnormal levels of other serum enzymes: Secondary | ICD-10-CM | POA: Diagnosis not present

## 2021-11-04 DIAGNOSIS — R935 Abnormal findings on diagnostic imaging of other abdominal regions, including retroperitoneum: Secondary | ICD-10-CM | POA: Diagnosis not present

## 2021-11-18 ENCOUNTER — Ambulatory Visit (HOSPITAL_BASED_OUTPATIENT_CLINIC_OR_DEPARTMENT_OTHER): Payer: BC Managed Care – PPO | Attending: Pulmonary Disease | Admitting: Pulmonary Disease

## 2021-11-18 ENCOUNTER — Other Ambulatory Visit: Payer: Self-pay

## 2021-11-18 DIAGNOSIS — G4733 Obstructive sleep apnea (adult) (pediatric): Secondary | ICD-10-CM | POA: Diagnosis not present

## 2021-11-21 ENCOUNTER — Emergency Department (HOSPITAL_COMMUNITY)
Admission: EM | Admit: 2021-11-21 | Discharge: 2021-11-21 | Disposition: A | Discharge status: Home / Self Care | Payer: BC Managed Care – PPO | Attending: Emergency Medicine | Admitting: Emergency Medicine

## 2021-11-21 ENCOUNTER — Other Ambulatory Visit: Payer: Self-pay

## 2021-11-21 ENCOUNTER — Emergency Department (HOSPITAL_COMMUNITY): Payer: BC Managed Care – PPO

## 2021-11-21 ENCOUNTER — Encounter (HOSPITAL_COMMUNITY): Payer: Self-pay

## 2021-11-21 DIAGNOSIS — R519 Headache, unspecified: Secondary | ICD-10-CM | POA: Insufficient documentation

## 2021-11-21 DIAGNOSIS — M542 Cervicalgia: Secondary | ICD-10-CM | POA: Diagnosis not present

## 2021-11-21 DIAGNOSIS — Z794 Long term (current) use of insulin: Secondary | ICD-10-CM | POA: Diagnosis not present

## 2021-11-21 DIAGNOSIS — Y92410 Unspecified street and highway as the place of occurrence of the external cause: Secondary | ICD-10-CM | POA: Insufficient documentation

## 2021-11-21 DIAGNOSIS — M25511 Pain in right shoulder: Secondary | ICD-10-CM | POA: Diagnosis not present

## 2021-11-21 DIAGNOSIS — M47812 Spondylosis without myelopathy or radiculopathy, cervical region: Secondary | ICD-10-CM | POA: Diagnosis not present

## 2021-11-21 DIAGNOSIS — S0990XA Unspecified injury of head, initial encounter: Secondary | ICD-10-CM | POA: Insufficient documentation

## 2021-11-21 DIAGNOSIS — Z79899 Other long term (current) drug therapy: Secondary | ICD-10-CM | POA: Diagnosis not present

## 2021-11-21 DIAGNOSIS — M25519 Pain in unspecified shoulder: Secondary | ICD-10-CM | POA: Diagnosis not present

## 2021-11-21 MED ORDER — IBUPROFEN 200 MG PO TABS
600.0000 mg | ORAL_TABLET | Freq: Once | ORAL | Status: AC
Start: 2021-11-21 — End: 2021-11-21
  Administered 2021-11-21: 600 mg via ORAL
  Filled 2021-11-21: qty 3
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
## 2021-11-21 NOTE — Discharge Instructions (Signed)
Return for any problem.  ?
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
## 2021-11-26 ENCOUNTER — Telehealth: Payer: Self-pay | Admitting: Pulmonary Disease

## 2021-11-26 NOTE — Telephone Encounter (Signed)
Call patient ? ?Sleep study result ? ?Date of study: ?11/18/2021 ? ?Impression: ?Mild obstructive sleep apnea ? ?Recommendation: ?Options of treatment for mild obstructive sleep apnea will include ? ?1.  CPAP therapy if there is significant daytime sleepiness or other comorbidities including history of CVA or cardiac disease ? ?-If CPAP is chosen as an option of treatment auto titrating CPAP with a pressure setting of 5-15 will be appropriate ? ? ?2.  Watchful waiting with emphasis on weight loss measures, sleep position modification to optimize lateral sleep, elevating the head of the bed by about 30 degrees may also help. ? ? ?3.  An oral device may be fashioned for the treatment of mild sleep disordered breathing, will involve referral to dentist. ? ? ?Follow-up as previously scheduled ?

## 2021-11-26 NOTE — Telephone Encounter (Signed)
I called the patient and wife (DPR) was given the results and she will call back once she talks to him and let me know if he wants to move forward with a CPAP.  ?

## 2021-11-26 NOTE — Procedures (Signed)
POLYSOMNOGRAPHY  Last, First: Omar Travis, Omar Travis MRN: [id number] Gender: Male Age (years): 56 Weight (lbs): 215 DOB: 1966/02/01 BMI: 29 Primary Care: No PCP Epworth Score: 7 Referring: Laurin Coder Travis Technician: Gwenyth Allegra Interpreting: Laurin Coder Travis Study Type: NPSG Ordered Study Type: Split Night CPAP Study date: 11/18/2021 Location: [place] CLINICAL INFORMATION Omar Travis is a 56 year old Male and was referred to the sleep center for evaluation of G47.33 OSA: Adult and Pediatric (327.23). Indications include Diabetes, Hypertension, Snoring.  MEDICATIONS Patient self administered medications include: N/A. Medications administered during study include No sleep medicine administered.  SLEEP STUDY TECHNIQUE A multi-channel overnight Polysomnography study was performed. The channels recorded and monitored were central and occipital EEG, electrooculogram (EOG), submentalis EMG (chin), nasal and oral airflow, thoracic and abdominal wall motion, anterior tibialis EMG, snore microphone, electrocardiogram, and a pulse oximetry. TECHNICIAN COMMENTS Comments added by Technician: None Comments added by Scorer: N/A SLEEP ARCHITECTURE The study was initiated at 10:49:38 PM and terminated at 4:52:01 AM. The total recorded time was 362.4 minutes. EEG confirmed total sleep time was 316.5 minutes yielding a sleep efficiency of 87.3%%. Sleep onset after lights out was 20.7 minutes with a REM latency of 75.0 minutes. The patient spent 3.3%% of the night in stage N1 sleep, 69.4%% in stage N2 sleep, 0.0%% in stage N3 and 27.3% in REM. Wake after sleep onset (WASO) was 25.2 minutes. The Arousal Index was 8.9/hour. RESPIRATORY PARAMETERS There were a total of 78 respiratory disturbances out of which 0 were apneas ( 0 obstructive, 0 mixed, 0 central) and 78 hypopneas. The apnea/hypopnea index (AHI) was 14.8 events/hour. The central sleep apnea index was 0 events/hour. The REM AHI was  4.9 events/hour and NREM AHI was 18.5 events/hour. The supine AHI was 41.5 events/hour and the non supine AHI was 3.3 supine during 30.17% of sleep. Respiratory disturbances were associated with oxygen desaturation down to a nadir of 80.0% during sleep. The mean oxygen saturation during the study was 93.1%. The cumulative time under 88% oxygen saturation was 5.5 minutes.  LEG MOVEMENT DATA The total leg movements were 0 with a resulting leg movement index of 0.0/hr .Associated arousal with leg movement index was 0.0/hr.  CARDIAC DATA The underlying cardiac rhythm was most consistent with sinus rhythm. Mean heart rate during sleep was 83.7 bpm. Additional rhythm abnormalities include None.  IMPRESSIONS - Mild Obstructive Sleep apnea(OSA) - EKG showed no cardiac abnormalities. - Mild Oxygen Desaturation - The patient snored with moderate snoring volume. - No significant periodic leg movements(PLMs) during sleep. However, no significant associated arousals.  DIAGNOSIS - Obstructive Sleep Apnea (G47.33)  RECOMMENDATIONS - Therapeutic CPAP titration to determine optimal pressure required to alleviate sleep disordered breathing. - Auto CPAP with CPAP settings of 5-15 may be considered as option of treatment. - An oral device may be considered as an option of treatment for mild obstructive sleep apnea. - Positional therapy avoiding supine position during sleep. - Avoid alcohol, sedatives and other CNS depressants that may worsen sleep apnea and disrupt normal sleep architecture. - Sleep hygiene should be reviewed to assess factors that may improve sleep quality. - Weight management and regular exercise should be initiated or continued.  [Electronically signed] 11/26/2021 07:58 AM  Omar Travis NPI: [id number]

## 2021-12-02 ENCOUNTER — Encounter: Payer: Self-pay | Admitting: Family Medicine

## 2021-12-02 ENCOUNTER — Ambulatory Visit (INDEPENDENT_AMBULATORY_CARE_PROVIDER_SITE_OTHER): Payer: BC Managed Care – PPO | Admitting: Family Medicine

## 2021-12-02 DIAGNOSIS — S46811A Strain of other muscles, fascia and tendons at shoulder and upper arm level, right arm, initial encounter: Secondary | ICD-10-CM

## 2021-12-02 DIAGNOSIS — M25511 Pain in right shoulder: Secondary | ICD-10-CM

## 2021-12-02 DIAGNOSIS — M542 Cervicalgia: Secondary | ICD-10-CM | POA: Diagnosis not present

## 2021-12-02 DIAGNOSIS — K750 Abscess of liver: Secondary | ICD-10-CM

## 2021-12-02 DIAGNOSIS — K7581 Nonalcoholic steatohepatitis (NASH): Secondary | ICD-10-CM

## 2021-12-02 DIAGNOSIS — S46811D Strain of other muscles, fascia and tendons at shoulder and upper arm level, right arm, subsequent encounter: Secondary | ICD-10-CM

## 2021-12-02 DIAGNOSIS — K8309 Other cholangitis: Secondary | ICD-10-CM | POA: Diagnosis not present

## 2021-12-02 MED ORDER — METHOCARBAMOL 500 MG PO TABS: 250.0000 mg | ORAL_TABLET | Freq: Every evening | ORAL | 0 refills | Status: AC | PRN

## 2021-12-02 MED ORDER — IBUPROFEN 800 MG PO TABS: 800.0000 mg | ORAL_TABLET | Freq: Three times a day (TID) | ORAL | 0 refills | Status: AC | PRN

## 2021-12-02 NOTE — Addendum Note (Signed)
Addended by: Grier Mitts R on: 12/02/2021 04:35 PM ? ? Modules accepted: Orders ? ?
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
## 2021-12-03 LAB — COMPREHENSIVE METABOLIC PANEL
ALT: 57 U/L — ABNORMAL HIGH (ref 0–53)
AST: 36 U/L (ref 0–37)
Albumin: 4.4 g/dL (ref 3.5–5.2)
Alkaline Phosphatase: 127 U/L — ABNORMAL HIGH (ref 39–117)
BUN: 19 mg/dL (ref 6–23)
CO2: 27 mEq/L (ref 19–32)
Calcium: 9.3 mg/dL (ref 8.4–10.5)
Chloride: 101 mEq/L (ref 96–112)
Creatinine, Ser: 0.86 mg/dL (ref 0.40–1.50)
GFR: 99 mL/min (ref >60.00)
Glucose, Bld: 109 mg/dL — ABNORMAL HIGH (ref 70–99)
Potassium: 4.1 mEq/L (ref 3.5–5.1)
Sodium: 134 mEq/L — ABNORMAL LOW (ref 135–145)
Total Bilirubin: 0.4 mg/dL (ref 0.2–1.2)
Total Protein: 6.8 g/dL (ref 6.0–8.3)

## 2021-12-12 DIAGNOSIS — S134XXA Sprain of ligaments of cervical spine, initial encounter: Secondary | ICD-10-CM | POA: Diagnosis not present

## 2021-12-12 DIAGNOSIS — S338XXA Sprain of other parts of lumbar spine and pelvis, initial encounter: Secondary | ICD-10-CM | POA: Diagnosis not present

## 2021-12-12 DIAGNOSIS — S233XXA Sprain of ligaments of thoracic spine, initial encounter: Secondary | ICD-10-CM | POA: Diagnosis not present

## 2021-12-16 DIAGNOSIS — S43401A Unspecified sprain of right shoulder joint, initial encounter: Secondary | ICD-10-CM | POA: Diagnosis not present

## 2021-12-16 DIAGNOSIS — S134XXA Sprain of ligaments of cervical spine, initial encounter: Secondary | ICD-10-CM | POA: Diagnosis not present

## 2021-12-16 DIAGNOSIS — S233XXA Sprain of ligaments of thoracic spine, initial encounter: Secondary | ICD-10-CM | POA: Diagnosis not present

## 2021-12-16 DIAGNOSIS — S338XXA Sprain of other parts of lumbar spine and pelvis, initial encounter: Secondary | ICD-10-CM | POA: Diagnosis not present

## 2021-12-23 DIAGNOSIS — S233XXA Sprain of ligaments of thoracic spine, initial encounter: Secondary | ICD-10-CM | POA: Diagnosis not present

## 2021-12-23 DIAGNOSIS — S338XXA Sprain of other parts of lumbar spine and pelvis, initial encounter: Secondary | ICD-10-CM | POA: Diagnosis not present

## 2021-12-23 DIAGNOSIS — S43401A Unspecified sprain of right shoulder joint, initial encounter: Secondary | ICD-10-CM | POA: Diagnosis not present

## 2021-12-23 DIAGNOSIS — S134XXA Sprain of ligaments of cervical spine, initial encounter: Secondary | ICD-10-CM | POA: Diagnosis not present

## 2022-01-01 DIAGNOSIS — S233XXA Sprain of ligaments of thoracic spine, initial encounter: Secondary | ICD-10-CM | POA: Diagnosis not present

## 2022-01-01 DIAGNOSIS — S43401A Unspecified sprain of right shoulder joint, initial encounter: Secondary | ICD-10-CM | POA: Diagnosis not present

## 2022-01-01 DIAGNOSIS — S338XXA Sprain of other parts of lumbar spine and pelvis, initial encounter: Secondary | ICD-10-CM | POA: Diagnosis not present

## 2022-01-01 DIAGNOSIS — S134XXA Sprain of ligaments of cervical spine, initial encounter: Secondary | ICD-10-CM | POA: Diagnosis not present

## 2022-01-27 DIAGNOSIS — E1159 Type 2 diabetes mellitus with other circulatory complications: Secondary | ICD-10-CM | POA: Diagnosis not present

## 2022-01-27 DIAGNOSIS — Z91199 Patient's noncompliance with other medical treatment and regimen due to unspecified reason: Secondary | ICD-10-CM | POA: Diagnosis not present

## 2022-01-27 DIAGNOSIS — E1169 Type 2 diabetes mellitus with other specified complication: Secondary | ICD-10-CM | POA: Diagnosis not present

## 2022-01-27 DIAGNOSIS — I152 Hypertension secondary to endocrine disorders: Secondary | ICD-10-CM | POA: Diagnosis not present

## 2022-01-27 DIAGNOSIS — E1165 Type 2 diabetes mellitus with hyperglycemia: Secondary | ICD-10-CM | POA: Diagnosis not present

## 2022-01-27 DIAGNOSIS — Z794 Long term (current) use of insulin: Secondary | ICD-10-CM | POA: Diagnosis not present

## 2022-02-18 ENCOUNTER — Other Ambulatory Visit: Payer: Self-pay | Admitting: Family Medicine

## 2022-02-18 DIAGNOSIS — F339 Major depressive disorder, recurrent, unspecified: Secondary | ICD-10-CM

## 2022-03-12 ENCOUNTER — Other Ambulatory Visit: Payer: Self-pay | Admitting: Family Medicine

## 2022-03-12 DIAGNOSIS — I10 Essential (primary) hypertension: Secondary | ICD-10-CM

## 2022-04-16 ENCOUNTER — Other Ambulatory Visit: Payer: Self-pay | Admitting: Family Medicine

## 2022-04-16 DIAGNOSIS — K219 Gastro-esophageal reflux disease without esophagitis: Secondary | ICD-10-CM

## 2022-04-16 DIAGNOSIS — I10 Essential (primary) hypertension: Secondary | ICD-10-CM

## 2022-05-05 DIAGNOSIS — S338XXA Sprain of other parts of lumbar spine and pelvis, initial encounter: Secondary | ICD-10-CM | POA: Diagnosis not present

## 2022-05-05 DIAGNOSIS — S233XXA Sprain of ligaments of thoracic spine, initial encounter: Secondary | ICD-10-CM | POA: Diagnosis not present

## 2022-05-05 DIAGNOSIS — S43401A Unspecified sprain of right shoulder joint, initial encounter: Secondary | ICD-10-CM | POA: Diagnosis not present

## 2022-05-05 DIAGNOSIS — S134XXA Sprain of ligaments of cervical spine, initial encounter: Secondary | ICD-10-CM | POA: Diagnosis not present

## 2022-08-29 ENCOUNTER — Telehealth: Payer: Self-pay

## 2022-08-29 NOTE — Patient Outreach (Signed)
  Care Coordination   Initial Visit Note   08/29/2022 Name: Omar Travis MRN: [id number] DOB: 1966-01-03  Omar Travis is a 56 y.o. year old male who sees Omar Ruddy, MD for primary care. I spoke with  Omar Travis by phone today.  What matters to the patients health and wellness today?  I am working on getting my blood sugars in control and I am wearing my Dexcom 6.  States he knows what to do but had not been doing.  States he is going to his endocrinologist regularly.  No other concerns today    Goals Addressed             This Visit's Progress    COMPLETED: Care Coordination Activities - no follow up required       Care Coordination Interventions: Provided education to patient re: care coordination services Provided education to patient about basic DM disease process Reviewed medications with patient and discussed importance of medication adherence Reviewed scheduled/upcoming provider appointments including: Endocrinology 11/19/22 Advised patient, providing education and rationale, to check cbg 2-4 times a day and record, calling provider for findings outside established parameters Assessed social determinant of health barriers Discussed care coordination services but voices that he knows what to do and will follow up with his provider as needed         SDOH assessments and interventions completed:  Yes  SDOH Interventions Today    Flowsheet Row Most Recent Value  SDOH Interventions   Food Insecurity Interventions Intervention Not Indicated  Housing Interventions Intervention Not Indicated  Transportation Interventions Intervention Not Indicated  Utilities Interventions Intervention Not Indicated        Care Coordination Interventions:  Yes, provided   Follow up plan: No further intervention required.   Encounter Outcome:  Pt. Visit Completed  Omar Garter RN, BSN,CCM, CDE Care Management Coordinator [facility] [phone]

## 2022-08-29 NOTE — Patient Instructions (Signed)
Visit Information  Thank you for taking time to visit with me today. Please don't hesitate to contact me if I can be of assistance to you.   Following are the goals we discussed today:   Goals Addressed             This Visit's Progress    COMPLETED: Care Coordination Activities - no follow up required       Care Coordination Interventions: Provided education to patient re: care coordination services Provided education to patient about basic DM disease process Reviewed medications with patient and discussed importance of medication adherence Reviewed scheduled/upcoming provider appointments including: Endocrinology 11/19/22 Advised patient, providing education and rationale, to check cbg 2-4 times a day and record, calling provider for findings outside established parameters Assessed social determinant of health barriers Discussed care coordination services but voices that he knows what to do and will follow up with his provider as needed          If you are experiencing a [place] or [place] or need someone to talk to, please call the Suicide and Crisis Lifeline: 988 call the [place] National Suicide Prevention Lifeline: (612)459-6974 or TTY: 662-875-5521 TTY (367)840-0063) to talk to a trained counselor call 1-800-273-TALK (toll free, 24 hour hotline) call 911   Patient verbalizes understanding of instructions and care plan provided today and agrees to view in [place]. Active MyChart status and patient understanding of how to access instructions and care plan via MyChart confirmed with patient.     No further follow up required:    Peter Garter RN, Jackquline Denmark, [facility] [phone]

## 2022-09-04 ENCOUNTER — Other Ambulatory Visit: Payer: Self-pay | Admitting: Family Medicine

## 2022-09-04 DIAGNOSIS — F339 Major depressive disorder, recurrent, unspecified: Secondary | ICD-10-CM

## 2022-09-08 ENCOUNTER — Telehealth: Payer: Self-pay

## 2022-09-08 ENCOUNTER — Other Ambulatory Visit (HOSPITAL_COMMUNITY): Payer: Self-pay

## 2022-09-08 ENCOUNTER — Encounter: Payer: Self-pay | Admitting: Family Medicine

## 2022-09-08 ENCOUNTER — Ambulatory Visit (INDEPENDENT_AMBULATORY_CARE_PROVIDER_SITE_OTHER): Payer: BC Managed Care – PPO | Admitting: Family Medicine

## 2022-09-08 VITALS — BP 102/68 | HR 90 | Temp 98.1°F | Wt 242.0 lb

## 2022-09-08 DIAGNOSIS — Z794 Long term (current) use of insulin: Secondary | ICD-10-CM | POA: Diagnosis not present

## 2022-09-08 DIAGNOSIS — Z8719 Personal history of other diseases of the digestive system: Secondary | ICD-10-CM

## 2022-09-08 DIAGNOSIS — Z1211 Encounter for screening for malignant neoplasm of colon: Secondary | ICD-10-CM

## 2022-09-08 DIAGNOSIS — K7581 Nonalcoholic steatohepatitis (NASH): Secondary | ICD-10-CM | POA: Diagnosis not present

## 2022-09-08 DIAGNOSIS — I10 Essential (primary) hypertension: Secondary | ICD-10-CM

## 2022-09-08 DIAGNOSIS — E1169 Type 2 diabetes mellitus with other specified complication: Secondary | ICD-10-CM | POA: Diagnosis not present

## 2022-09-08 LAB — LIPID PANEL
Cholesterol: 152 mg/dL (ref 0–200)
HDL: 50.6 mg/dL (ref >39.00)
LDL Cholesterol: 67 mg/dL (ref 0–99)
NonHDL: 101.69
Total CHOL/HDL Ratio: 3
Triglycerides: 172 mg/dL — ABNORMAL HIGH (ref 0.0–149.0)
VLDL: 34.4 mg/dL (ref 0.0–40.0)

## 2022-09-08 LAB — COMPREHENSIVE METABOLIC PANEL
ALT: 47 U/L (ref 0–53)
AST: 37 U/L (ref 0–37)
Albumin: 4.2 g/dL (ref 3.5–5.2)
Alkaline Phosphatase: 86 U/L (ref 39–117)
BUN: 14 mg/dL (ref 6–23)
CO2: 27 mEq/L (ref 19–32)
Calcium: 9.1 mg/dL (ref 8.4–10.5)
Chloride: 103 mEq/L (ref 96–112)
Creatinine, Ser: 0.93 mg/dL (ref 0.40–1.50)
GFR: 93.38 mL/min (ref >60.00)
Glucose, Bld: 198 mg/dL — ABNORMAL HIGH (ref 70–99)
Potassium: 4.4 mEq/L (ref 3.5–5.1)
Sodium: 136 mEq/L (ref 135–145)
Total Bilirubin: 0.4 mg/dL (ref 0.2–1.2)
Total Protein: 6.7 g/dL (ref 6.0–8.3)

## 2022-09-08 LAB — POCT GLYCOSYLATED HEMOGLOBIN (HGB A1C): Hemoglobin A1C: 10.1 % — AB (ref 4.0–5.6)

## 2022-09-08 LAB — MICROALBUMIN / CREATININE URINE RATIO
Creatinine,U: 97.6 mg/dL
Microalb Creat Ratio: 0.7 mg/g (ref 0.0–30.0)
Microalb, Ur: <0.7 mg/dL (ref 0.0–1.9)

## 2022-09-08 MED ORDER — CANAGLIFLOZIN 300 MG PO TABS: 300.0000 mg | ORAL_TABLET | Freq: Every day | ORAL | 1 refills | Status: AC

## 2022-09-08 NOTE — Telephone Encounter (Signed)
Pharmacy Patient Advocate Encounter   Received notification from [facility] that prior authorization for Invokana '300mg'$  tabs is required/requested.  Per Test Claim: Product/service not covered - Plan benefit exclusion product not on formulary. Plan pref: Lenord Carbo, Metformin   PA submitted on 09/08/22 to (ins) [employer] via [employer] Status is pending

## 2022-09-08 NOTE — Patient Instructions (Addendum)
Do not forget make an appointment to follow-up with the gastroenterologist.  We can place a referral for you to get a second opinion if you like.  We will get urine to assess your kidney function as relates to diabetes.  I also placed an order for colonoscopy to screen for colon cancer.

## 2022-09-08 NOTE — Progress Notes (Signed)
Subjective:    Patient ID: Omar Travis, male    DOB: [birth year], 56 y.o.   MRN: [id number]  Chief Complaint  Patient presents with   Follow-up   Diabetes    HPI Patient was seen today for f/u.  Pt seen by Endo for DM, 01/27/22.  Next Appt in Feb 2024.  States has been out of invokana, waiting on insurance to approve med.  Taking Ozemipic .'5mg'$  and toujeo 40 mg.  Has dexcom gcm.  States it goes off frequently as bs over 200s.  Some improvement in diet.  Pt has h/o cholangitis, NASH.  Denies abd pain.    Past Medical History:  Diagnosis Date   Biliary obstruction due to malignant neoplasm ([place]) 09/05/2021   Diabetes ([place])    GERD (gastroesophageal reflux disease)    HTN (hypertension)    Hyperlipidemia    Liver abscess 08/30/2021   NASH (nonalcoholic steatohepatitis) 09/05/2021   Vaccine counseling 09/05/2021    Allergies  Allergen Reactions   Bee Venom Hives and Swelling   Benadryl Allergy [Diphenhydramine Hcl] Rash   Flexeril [Cyclobenzaprine] Rash    ROS General: Denies fever, chills, night sweats, changes in weight, changes in appetite HEENT: Denies headaches, ear pain, changes in vision, rhinorrhea, sore throat CV: Denies CP, palpitations, SOB, orthopnea Pulm: Denies SOB, cough, wheezing GI: Denies abdominal pain, nausea, vomiting, diarrhea, constipation GU: Denies dysuria, hematuria, frequency Msk: Denies muscle cramps, joint pains Neuro: Denies weakness, numbness, tingling Skin: Denies rashes, bruising Psych: Denies depression, anxiety, hallucinations     Objective:    Blood pressure 102/68, pulse 90, temperature 98.1 F (36.7 C), temperature source Oral, weight 242 lb (109.8 kg), SpO2 96 %.  Gen. Pleasant, well-nourished, in no distress, normal affect   HEENT: [place]/AT, face symmetric, conjunctiva clear, no scleral icterus, PERRLA, EOMI, nares patent without drainage Lungs: no accessory muscle use, CTAB, no wheezes or rales Cardiovascular: RRR, no m/r/g, no  peripheral edema Abdomen: BS present, soft, NT/ND, no hepatosplenomegaly. Neuro:  A&Ox3, CN II-XII intact, normal gait Skin:  Warm, no lesions/ rash   Wt Readings from Last 3 Encounters:  09/08/22 242 lb (109.8 kg)  12/02/21 223 lb 12.8 oz (101.5 kg)  11/21/21 214 lb 15.2 oz (97.5 kg)    Lab Results  Component Value Date   WBC 5.2 10/21/2021   HGB 14.1 10/21/2021   HCT 44.0 10/21/2021   PLT 180 10/21/2021   GLUCOSE 109 (H) 12/02/2021   CHOL 181 07/08/2021   TRIG 268.0 (H) 07/08/2021   HDL 47.60 07/08/2021   LDLDIRECT 108.0 07/08/2021   ALT 57 (H) 12/02/2021   AST 36 12/02/2021   NA 134 (L) 12/02/2021   K 4.1 12/02/2021   CL 101 12/02/2021   CREATININE 0.86 12/02/2021   BUN 19 12/02/2021   CO2 27 12/02/2021   INR 1.0 07/27/2021   HGBA1C 10.1 (A) 09/08/2022    Assessment/Plan:  Type 2 diabetes mellitus with other specified complication, with long-term current use of insulin (HCC) -Elevated -Advised to check with Endo regarding PA for Invokana. -Stressed the importance of lifestyle modifications -Patient to schedule eye exam - Plan: POC HgB A1c, Lipid panel, canagliflozin (INVOKANA) 300 MG TABS tablet, Microalbumin/Creatinine Ratio, Urine  Essential hypertension  -Controlled -Continue current medications - Plan: CMP  History of cholangitis -Stable  - Plan: CMP  NASH (nonalcoholic steatohepatitis) -Stable -Encouraged to schedule follow-up with GI - Plan: CMP, Lipid panel  F/u in 2 to 3 months, sooner if needed  [employer],  MD

## 2022-09-09 NOTE — Progress Notes (Signed)
Prior authorization has been denied in a separate encounter.

## 2022-09-09 NOTE — Telephone Encounter (Signed)
Pharmacy Patient Advocate Encounter  Received notification from [facility] that the request for prior authorization for Invokana '300mg'$  has been denied due to .

## 2022-10-28 ENCOUNTER — Telehealth: Payer: Self-pay | Admitting: Physician Assistant

## 2022-10-28 ENCOUNTER — Encounter: Payer: Self-pay | Admitting: Family Medicine

## 2022-10-28 DIAGNOSIS — J019 Acute sinusitis, unspecified: Secondary | ICD-10-CM

## 2022-10-28 DIAGNOSIS — B9789 Other viral agents as the cause of diseases classified elsewhere: Secondary | ICD-10-CM

## 2022-10-28 MED ORDER — FLUTICASONE PROPIONATE 50 MCG/ACT NA SUSP: 2.0000 | Freq: Every day | NASAL | 0 refills | Status: AC

## 2022-10-28 NOTE — Progress Notes (Signed)
I have spent 5 minutes in review of e-visit questionnaire, review and updating patient chart, medical decision making and response to patient.   Jelene Albano Cody Hussein Macdougal, PA-C    

## 2022-10-28 NOTE — Progress Notes (Signed)
E-Visit for Sinus Problems  We are sorry that you are not feeling well.  Here is how we plan to help!  Giving health history, I highly recommend COVID testing to be cautious, giving rising rates in our area currently.   Based on what you have shared with me it looks like you have sinusitis.  Sinusitis is inflammation and infection in the sinus cavities of the head.  Based on your presentation I believe you most likely have Acute Viral Sinusitis.This is an infection most likely caused by a virus. There is not specific treatment for viral sinusitis other than to help you with the symptoms until the infection runs its course.  You may use an oral decongestant such as Mucinex D or if you have glaucoma or high blood pressure use plain Mucinex. Saline nasal spray help and can safely be used as often as needed for congestion, I have prescribed: Fluticasone nasal spray two sprays in each nostril once a day  Some authorities believe that zinc sprays or the use of Echinacea may shorten the course of your symptoms.  Sinus infections are not as easily transmitted as other respiratory infection, however we still recommend that you avoid close contact with loved ones, especially the very young and elderly.  Remember to wash your hands thoroughly throughout the day as this is the number one way to prevent the spread of infection!  I have sent a work note to Doctor, hospital. You can find by going to the Menu on your homepage, scrolling down to the Communications section, and selecting Letters. Let us know if you have any issue locating. Take care and feel better soon!  Home Care: Only take medications as instructed by your medical team. Do not take these medications with alcohol. A steam or ultrasonic humidifier can help congestion.  You can place a towel over your head and breathe in the steam from hot water coming from a faucet. Avoid close contacts especially the very young and the elderly. Cover your mouth when  you cough or sneeze. Always remember to wash your hands.  Get Help Right Away If: You develop worsening fever or sinus pain. You develop a severe head ache or visual changes. Your symptoms persist after you have completed your treatment plan.  Make sure you Understand these instructions. Will watch your condition. Will get help right away if you are not doing well or get worse.   Thank you for choosing an e-visit.  Your e-visit answers were reviewed by a board certified advanced clinical practitioner to complete your personal care plan. Depending upon the condition, your plan could have included both over the counter or prescription medications.  Please review your pharmacy choice. Make sure the pharmacy is open so you can pick up prescription now. If there is a problem, you may contact your provider through [employer] and have the prescription routed to another pharmacy.  Your safety is important to [place]. If you have drug allergies check your prescription carefully.   For the next 24 hours you can use MyChart to ask questions about today's visit, request a non-urgent call back, or ask for a work or school excuse. You will get an email in the next two days asking about your experience. I hope that your e-visit has been valuable and will speed your recovery.

## 2022-12-29 ENCOUNTER — Other Ambulatory Visit: Payer: Self-pay | Admitting: Family Medicine

## 2022-12-29 DIAGNOSIS — I10 Essential (primary) hypertension: Secondary | ICD-10-CM

## 2023-02-12 ENCOUNTER — Telehealth: Payer: 59 | Admitting: Physician Assistant

## 2023-02-12 DIAGNOSIS — J019 Acute sinusitis, unspecified: Secondary | ICD-10-CM | POA: Diagnosis not present

## 2023-02-12 DIAGNOSIS — B9689 Other specified bacterial agents as the cause of diseases classified elsewhere: Secondary | ICD-10-CM | POA: Diagnosis not present

## 2023-02-12 MED ORDER — DOXYCYCLINE HYCLATE 100 MG PO TABS: 100.0000 mg | ORAL_TABLET | Freq: Two times a day (BID) | ORAL | 0 refills | Status: DC

## 2023-02-12 NOTE — Progress Notes (Signed)
E-Visit for Sinus Problems  We are sorry that you are not feeling well.  Here is how we plan to help!  Based on what you have shared with me it looks like you have sinusitis.  Sinusitis is inflammation and infection in the sinus cavities of the head.  Based on your presentation and wife having similar symptoms this is most likely viral. However, giving uncontrolled diabetes in your case and change in nasal discharge, you could have a secondary bacterial infection. As such, I have prescribed Doxycycline 100mg  by mouth twice a day for 10 days. You may use an oral decongestant such as Mucinex D or if you have glaucoma or high blood pressure use plain Mucinex. Saline nasal spray help and can safely be used as often as needed for congestion.  If you develop worsening sinus pain, fever or notice severe headache and vision changes, or if symptoms are not better after completion of antibiotic, please schedule an appointment with a health care provider.    Sinus infections are not as easily transmitted as other respiratory infection, however we still recommend that you avoid close contact with loved ones, especially the very young and elderly.  Remember to wash your hands thoroughly throughout the day as this is the number one way to prevent the spread of infection!  Home Care: Only take medications as instructed by your medical team. Complete the entire course of an antibiotic. Do not take these medications with alcohol. A steam or ultrasonic humidifier can help congestion.  You can place a towel over your head and breathe in the steam from hot water coming from a faucet. Avoid close contacts especially the very young and the elderly. Cover your mouth when you cough or sneeze. Always remember to wash your hands.  Get Help Right Away If: You develop worsening fever or sinus pain. You develop a severe head ache or visual changes. Your symptoms persist after you have completed your treatment plan.  Make  sure you Understand these instructions. Will watch your condition. Will get help right away if you are not doing well or get worse.  Thank you for choosing an e-visit.  Your e-visit answers were reviewed by a board certified advanced clinical practitioner to complete your personal care plan. Depending upon the condition, your plan could have included both over the counter or prescription medications.  Please review your pharmacy choice. Make sure the pharmacy is open so you can pick up prescription now. If there is a problem, you may contact your provider through [employer] and have the prescription routed to another pharmacy.  Your safety is important to [place]. If you have drug allergies check your prescription carefully.   For the next 24 hours you can use MyChart to ask questions about today's visit, request a non-urgent call back, or ask for a work or school excuse. You will get an email in the next two days asking about your experience. I hope that your e-visit has been valuable and will speed your recovery.

## 2023-02-12 NOTE — Progress Notes (Signed)
I have spent 5 minutes in review of e-visit questionnaire, review and updating patient chart, medical decision making and response to patient.   Yer Olivencia Cody Damarri Rampy, PA-C    

## 2023-03-13 ENCOUNTER — Other Ambulatory Visit: Payer: Self-pay | Admitting: Family Medicine

## 2023-03-13 DIAGNOSIS — F339 Major depressive disorder, recurrent, unspecified: Secondary | ICD-10-CM

## 2023-03-16 ENCOUNTER — Other Ambulatory Visit: Payer: Self-pay | Admitting: Family Medicine

## 2023-03-16 DIAGNOSIS — K219 Gastro-esophageal reflux disease without esophagitis: Secondary | ICD-10-CM

## 2023-03-16 DIAGNOSIS — I10 Essential (primary) hypertension: Secondary | ICD-10-CM

## 2023-04-07 ENCOUNTER — Other Ambulatory Visit: Payer: Self-pay | Admitting: Family Medicine

## 2023-04-07 DIAGNOSIS — I10 Essential (primary) hypertension: Secondary | ICD-10-CM

## 2023-04-14 ENCOUNTER — Other Ambulatory Visit: Payer: Self-pay | Admitting: Family Medicine

## 2023-04-14 DIAGNOSIS — F339 Major depressive disorder, recurrent, unspecified: Secondary | ICD-10-CM

## 2023-06-26 ENCOUNTER — Other Ambulatory Visit: Payer: Self-pay | Admitting: Family Medicine

## 2023-06-26 DIAGNOSIS — Z1211 Encounter for screening for malignant neoplasm of colon: Secondary | ICD-10-CM

## 2023-06-26 DIAGNOSIS — Z1212 Encounter for screening for malignant neoplasm of rectum: Secondary | ICD-10-CM

## 2023-08-17 ENCOUNTER — Other Ambulatory Visit: Payer: Self-pay | Admitting: Family Medicine

## 2023-08-17 DIAGNOSIS — I10 Essential (primary) hypertension: Secondary | ICD-10-CM

## 2023-08-17 DIAGNOSIS — K219 Gastro-esophageal reflux disease without esophagitis: Secondary | ICD-10-CM

## 2023-10-19 ENCOUNTER — Other Ambulatory Visit: Payer: Self-pay | Admitting: Family Medicine

## 2023-10-19 DIAGNOSIS — K219 Gastro-esophageal reflux disease without esophagitis: Secondary | ICD-10-CM

## 2023-10-19 DIAGNOSIS — I10 Essential (primary) hypertension: Secondary | ICD-10-CM

## 2023-11-02 ENCOUNTER — Other Ambulatory Visit: Payer: Self-pay | Admitting: Family Medicine

## 2023-11-02 DIAGNOSIS — F339 Major depressive disorder, recurrent, unspecified: Secondary | ICD-10-CM
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
## 2023-12-04 ENCOUNTER — Other Ambulatory Visit: Payer: Self-pay | Admitting: Family Medicine

## 2023-12-04 DIAGNOSIS — F339 Major depressive disorder, recurrent, unspecified: Secondary | ICD-10-CM

## 2023-12-07 NOTE — Telephone Encounter (Signed)
 Patient would need to sch an new pt appt to be seen.

## 2023-12-10 ENCOUNTER — Other Ambulatory Visit: Payer: Self-pay | Admitting: Family Medicine

## 2023-12-10 DIAGNOSIS — F339 Major depressive disorder, recurrent, unspecified: Secondary | ICD-10-CM

## 2023-12-10 NOTE — Telephone Encounter (Signed)
 Called patient to sch appt, left a VM to return call, the patient has not been seen since 08/2022,

## 2023-12-14 ENCOUNTER — Ambulatory Visit: Admitting: Family Medicine

## 2023-12-14 ENCOUNTER — Encounter: Payer: Self-pay | Admitting: Family Medicine

## 2023-12-14 VITALS — BP 128/76 | HR 95 | Temp 98.3°F | Ht 72.0 in | Wt 241.8 lb

## 2023-12-14 DIAGNOSIS — Z794 Long term (current) use of insulin: Secondary | ICD-10-CM

## 2023-12-14 DIAGNOSIS — E782 Mixed hyperlipidemia: Secondary | ICD-10-CM

## 2023-12-14 DIAGNOSIS — I10 Essential (primary) hypertension: Secondary | ICD-10-CM

## 2023-12-14 DIAGNOSIS — F339 Major depressive disorder, recurrent, unspecified: Secondary | ICD-10-CM

## 2023-12-14 DIAGNOSIS — E1169 Type 2 diabetes mellitus with other specified complication: Secondary | ICD-10-CM | POA: Diagnosis not present

## 2023-12-14 MED ORDER — ESCITALOPRAM OXALATE 10 MG PO TABS: 10.0000 mg | ORAL_TABLET | Freq: Every day | ORAL | 3 refills | Status: AC

## 2023-12-14 NOTE — Progress Notes (Signed)
 Established Patient Office Visit   Subjective  Patient ID: Omar Travis, male    DOB: 1966/08/18  Age: 58 y.o. MRN: [id number]  Chief Complaint  Patient presents with   Anxiety    Patient is a 58 year old male seen for med refill.  Patient states he is doing well.  Followed by endocrinology.  A1c now 8.9%.  Patient states still needs to work on diet and exercise.  BP better controlled.  Requesting refill on Lexapro 10 mg daily.  Endorses feeling foggy headed if he runs out of medication.    Patient Active Problem List   Diagnosis Date Noted   Medication monitoring encounter 09/29/2021   PICC (peripherally inserted central catheter) in place 09/29/2021   Encounter to obtain excuse from work 09/29/2021   Cirrhosis of liver without ascites (HCC) 09/26/2021   Recurrent cholangitis 09/26/2021   Biliary obstruction due to malignant neoplasm (HCC) 09/05/2021   NASH (nonalcoholic steatohepatitis) 09/05/2021   Vaccine counseling 09/05/2021   Liver abscess 08/30/2021   Generalized weakness    Pancytopenia (HCC) 07/24/2021   Febrile illness, acute 07/22/2021   Cholangitis 07/14/2021   Choledocholithiasis    Depression, recurrent (HCC) 11/24/2019   Gastroesophageal reflux disease 06/22/2019   Hyperlipidemia 06/22/2019   Diabetes mellitus (HCC)    Essential hypertension    Past Medical History:  Diagnosis Date   Biliary obstruction due to malignant neoplasm (HCC) 09/05/2021   Diabetes (HCC)    GERD (gastroesophageal reflux disease)    HTN (hypertension)    Hyperlipidemia    Liver abscess 08/30/2021   NASH (nonalcoholic steatohepatitis) 09/05/2021   Vaccine counseling 09/05/2021   Past Surgical History:  Procedure Laterality Date   BILIARY BRUSHING  09/02/2021   Procedure: BILIARY BRUSHING;  Surgeon: Vida Rigger, MD;  Location: [facility] ENDOSCOPY;  Service: Endoscopy;;   BILIARY BRUSHING  10/01/2021   Procedure: BILIARY BRUSHING;  Surgeon: Vida Rigger, MD;  Location: [facility]  ENDOSCOPY;  Service: Gastroenterology;;   BILIARY STENT PLACEMENT  09/02/2021   Procedure: BILIARY STENT PLACEMENT;  Surgeon: Vida Rigger, MD;  Location: [facility] ENDOSCOPY;  Service: Endoscopy;;   BIOPSY  09/02/2021   Procedure: BIOPSY;  Surgeon: Vida Rigger, MD;  Location: [facility] ENDOSCOPY;  Service: Endoscopy;;   BIOPSY  10/01/2021   Procedure: BIOPSY;  Surgeon: Vida Rigger, MD;  Location: [facility] ENDOSCOPY;  Service: Gastroenterology;;   CHOLECYSTECTOMY N/A 07/17/2021   Procedure: LAPAROSCOPIC CHOLECYSTECTOMY;  Surgeon: Berna Bue, MD;  Location: [facility] OR;  Service: General;  Laterality: N/A;   ENDOSCOPIC RETROGRADE CHOLANGIOPANCREATOGRAPHY (ERCP) WITH PROPOFOL N/A 07/16/2021   Procedure: ENDOSCOPIC RETROGRADE CHOLANGIOPANCREATOGRAPHY (ERCP) WITH PROPOFOL;  Surgeon: Vida Rigger, MD;  Location: [facility] ENDOSCOPY;  Service: Endoscopy;  Laterality: N/A;   ENDOSCOPIC RETROGRADE CHOLANGIOPANCREATOGRAPHY (ERCP) WITH PROPOFOL N/A 10/01/2021   Procedure: ENDOSCOPIC RETROGRADE CHOLANGIOPANCREATOGRAPHY (ERCP) WITH PROPOFOL;  Surgeon: Vida Rigger, MD;  Location: [facility] ENDOSCOPY;  Service: Gastroenterology;  Laterality: N/A;   ERCP N/A 08/22/2021   Procedure: ENDOSCOPIC RETROGRADE CHOLANGIOPANCREATOGRAPHY (ERCP);  Surgeon: Vida Rigger, MD;  Location: [facility] ENDOSCOPY;  Service: Endoscopy;  Laterality: N/A;   ERCP N/A 09/02/2021   Procedure: ENDOSCOPIC RETROGRADE CHOLANGIOPANCREATOGRAPHY (ERCP);  Surgeon: Vida Rigger, MD;  Location: [facility] ENDOSCOPY;  Service: Endoscopy;  Laterality: N/A;   ESOPHAGOGASTRODUODENOSCOPY N/A 10/01/2021   Procedure: ESOPHAGOGASTRODUODENOSCOPY (EGD);  Surgeon: Willis Modena, MD;  Location: [facility] ENDOSCOPY;  Service: Gastroenterology;  Laterality: N/A;   EUS  10/01/2021   Procedure: UPPER ENDOSCOPIC ULTRASOUND (EUS) LINEAR;  Surgeon: Willis Modena, MD;  Location: [facility] ENDOSCOPY;  Service:  Gastroenterology;;   FINE NEEDLE ASPIRATION N/A 10/01/2021   Procedure: FINE NEEDLE ASPIRATION (FNA) LINEAR;  Surgeon:  Willis Modena, MD;  Location: [facility] ENDOSCOPY;  Service: Gastroenterology;  Laterality: N/A;   LITHOTRIPSY  10/01/2021   Procedure: LITHOTRIPSY;  Surgeon: Vida Rigger, MD;  Location: [facility] ENDOSCOPY;  Service: Gastroenterology;;   PANCREATIC STENT PLACEMENT  07/16/2021   Procedure: PANCREATIC STENT PLACEMENT;  Surgeon: Vida Rigger, MD;  Location: [facility] ENDOSCOPY;  Service: Endoscopy;;   REMOVAL OF STONES  07/16/2021   Procedure: REMOVAL OF STONES;  Surgeon: Vida Rigger, MD;  Location: [facility] ENDOSCOPY;  Service: Endoscopy;;   REMOVAL OF STONES  08/22/2021   Procedure: REMOVAL OF STONES;  Surgeon: Vida Rigger, MD;  Location: [facility] ENDOSCOPY;  Service: Endoscopy;;   REMOVAL OF STONES  09/02/2021   Procedure: REMOVAL OF STONES;  Surgeon: Vida Rigger, MD;  Location: [facility] ENDOSCOPY;  Service: Endoscopy;;   REMOVAL OF STONES  10/01/2021   Procedure: REMOVAL OF STONES;  Surgeon: Vida Rigger, MD;  Location: [facility] ENDOSCOPY;  Service: Gastroenterology;;   Dennison Mascot  07/16/2021   Procedure: Dennison Mascot;  Surgeon: Vida Rigger, MD;  Location: [facility] ENDOSCOPY;  Service: Endoscopy;;   SPHINCTEROTOMY  08/22/2021   Procedure: Dennison Mascot;  Surgeon: Vida Rigger, MD;  Location: [facility] ENDOSCOPY;  Service: Endoscopy;;   SPYGLASS CHOLANGIOSCOPY N/A 09/02/2021   Procedure: SWFUXNAT CHOLANGIOSCOPY;  Surgeon: Vida Rigger, MD;  Location: [facility] ENDOSCOPY;  Service: Endoscopy;  Laterality: N/A;   SPYGLASS CHOLANGIOSCOPY N/A 10/01/2021   Procedure: SPYGLASS CHOLANGIOSCOPY;  Surgeon: Vida Rigger, MD;  Location: [facility] ENDOSCOPY;  Service: Gastroenterology;  Laterality: N/A;   SPYGLASS LITHOTRIPSY N/A 09/02/2021   Procedure: FTDDUKGU LITHOTRIPSY;  Surgeon: Vida Rigger, MD;  Location: [facility] ENDOSCOPY;  Service: Endoscopy;  Laterality: N/A;   STENT REMOVAL  10/01/2021   Procedure: STENT REMOVAL;  Surgeon: Willis Modena, MD;  Location: [facility] ENDOSCOPY;  Service: Gastroenterology;;   Social History   Tobacco Use   Smoking status: Never   Smokeless  tobacco: Never  Substance Use Topics   Alcohol use: Never    Comment: Social drinker only on occasion   Drug use: Never   Family History  Problem Relation Age of Onset   Diabetes Mother    Liver cancer Father    Diabetes Father    Allergies  Allergen Reactions   Bee Venom Hives and Swelling   Benadryl Allergy [Diphenhydramine Hcl] Rash   Flexeril [Cyclobenzaprine] Rash      ROS Negative unless stated above    Objective:     BP 128/76 (BP Location: Left Arm, Patient Position: Sitting, Cuff Size: Normal)   Pulse 95   Temp 98.3 F (36.8 C) (Oral)   Ht 6' (1.829 m)   Wt 241 lb 12.8 oz (109.7 kg)   SpO2 95%   BMI 32.79 kg/m  BP Readings from Last 3 Encounters:  12/14/23 128/76  09/08/22 102/68  12/02/21 126/77   Wt Readings from Last 3 Encounters:  12/14/23 241 lb 12.8 oz (109.7 kg)  09/08/22 242 lb (109.8 kg)  12/02/21 223 lb 12.8 oz (101.5 kg)    Physical Exam Constitutional:      General: He is not in acute distress.    Appearance: Normal appearance.  HENT:     Head: Normocephalic and atraumatic.     Nose: Nose normal.     Mouth/Throat:     Mouth: Mucous membranes are moist.  Cardiovascular:     Rate and Rhythm: Normal rate and regular rhythm.     Heart  sounds: Normal heart sounds. No murmur heard.    No gallop.  Pulmonary:     Effort: Pulmonary effort is normal. No respiratory distress.     Breath sounds: Normal breath sounds. No wheezing, rhonchi or rales.  Skin:    General: Skin is warm and dry.  Neurological:     Mental Status: He is alert and oriented to person, place, and time.       12/14/2023    5:36 PM 09/08/2022    1:29 PM 10/21/2021    2:51 PM  Depression screen PHQ 2/9  Decreased Interest 0 0 0  Down, Depressed, Hopeless 0 0 0  PHQ - 2 Score 0 0 0  Altered sleeping  1   Tired, decreased energy  1   Change in appetite  1   Feeling bad or failure about yourself   0   Trouble concentrating  0   Moving slowly or fidgety/restless   0   Suicidal thoughts  0   PHQ-9 Score  3       12/14/2023    5:36 PM 07/30/2020    9:52 AM  GAD 7 : Generalized Anxiety Score  Nervous, Anxious, on Edge 0 1  Control/stop worrying 0 1  Worry too much - different things 0 1  Trouble relaxing 0 1  Restless 0 1  Easily annoyed or irritable 0 2  Afraid - awful might happen 0 2  Total GAD 7 Score 0 9  Anxiety Difficulty Not difficult at all Somewhat difficult    No results found for any visits on 12/14/23.    Assessment & Plan:  Depression, recurrent (HCC) -     Escitalopram Oxalate; Take 1 tablet (10 mg total) by mouth daily.  Dispense: 90 tablet; Refill: 3  Type 2 diabetes mellitus with other specified complication, with long-term current use of insulin (HCC)  Essential hypertension  Mixed hyperlipidemia  Anxiety and depression stable.  Continue Lexapro 10 mg daily.  Refill sent to pharmacy.  A1c improving now 8.9% on 10/26/2023.  Labs from that visit reviewed including creatinine 0.91, EGFR 100, sodium 141, potassium 4.4, chloride 105, CO2 19, calcium 8.9, total protein 6.6, T. bili 0.3, alk phos 108, AST 31, ALT 44, total cholesterol 144, triglycerides 136, HDL 44, VLDL 24, LDL 76.  Last microalbumin/creatinine ratio 17 mg/g on 03/02/2023.  Diabetic OFV screen 12/29/2022.  Continue current medications including Ozempic 2 mg weekly, Basaglar 70 units daily, Jardiance 25 mg daily.  Continue follow-up with Warden Fillers, MD [facility] endocrinology.  Continue Crestor 20 mg daily and fenofibrate 145.    Return in about 3 months (around 03/15/2024) for physical.   Deeann Saint, MD

## 2024-01-04 ENCOUNTER — Other Ambulatory Visit: Payer: Self-pay | Admitting: Family Medicine

## 2024-01-04 DIAGNOSIS — I10 Essential (primary) hypertension: Secondary | ICD-10-CM

## 2024-01-30 ENCOUNTER — Other Ambulatory Visit: Payer: Self-pay | Admitting: Family Medicine

## 2024-01-30 DIAGNOSIS — K219 Gastro-esophageal reflux disease without esophagitis: Secondary | ICD-10-CM

## 2024-01-30 DIAGNOSIS — I10 Essential (primary) hypertension: Secondary | ICD-10-CM

## 2024-02-29 ENCOUNTER — Other Ambulatory Visit: Payer: Self-pay | Admitting: Family Medicine

## 2024-02-29 DIAGNOSIS — K219 Gastro-esophageal reflux disease without esophagitis: Secondary | ICD-10-CM

## 2024-02-29 DIAGNOSIS — I10 Essential (primary) hypertension: Secondary | ICD-10-CM

## 2024-03-11 ENCOUNTER — Other Ambulatory Visit: Payer: Self-pay | Admitting: Family Medicine

## 2024-03-11 DIAGNOSIS — K219 Gastro-esophageal reflux disease without esophagitis: Secondary | ICD-10-CM

## 2024-03-11 DIAGNOSIS — I10 Essential (primary) hypertension: Secondary | ICD-10-CM

## 2024-05-30 ENCOUNTER — Other Ambulatory Visit: Payer: Self-pay | Admitting: Family Medicine

## 2024-05-30 DIAGNOSIS — I10 Essential (primary) hypertension: Secondary | ICD-10-CM

## 2024-05-30 DIAGNOSIS — K219 Gastro-esophageal reflux disease without esophagitis: Secondary | ICD-10-CM

## 2024-05-31 NOTE — Telephone Encounter (Signed)
 Called and spoke with wife per DPR, patient will sch CPE

## 2024-06-10 ENCOUNTER — Other Ambulatory Visit: Payer: Self-pay | Admitting: Family Medicine

## 2024-06-10 DIAGNOSIS — I10 Essential (primary) hypertension: Secondary | ICD-10-CM

## 2024-06-10 DIAGNOSIS — K219 Gastro-esophageal reflux disease without esophagitis: Secondary | ICD-10-CM

## 2024-06-27 ENCOUNTER — Ambulatory Visit (INDEPENDENT_AMBULATORY_CARE_PROVIDER_SITE_OTHER): Admitting: Family Medicine

## 2024-06-27 ENCOUNTER — Encounter: Payer: Self-pay | Admitting: Family Medicine

## 2024-06-27 VITALS — BP 102/76 | HR 105 | Temp 98.8°F | Ht 72.0 in | Wt 243.0 lb

## 2024-06-27 DIAGNOSIS — I10 Essential (primary) hypertension: Secondary | ICD-10-CM

## 2024-06-27 DIAGNOSIS — Z0000 Encounter for general adult medical examination without abnormal findings: Secondary | ICD-10-CM | POA: Diagnosis not present

## 2024-06-27 DIAGNOSIS — Z23 Encounter for immunization: Secondary | ICD-10-CM | POA: Diagnosis not present

## 2024-06-27 DIAGNOSIS — E1169 Type 2 diabetes mellitus with other specified complication: Secondary | ICD-10-CM

## 2024-06-27 DIAGNOSIS — E782 Mixed hyperlipidemia: Secondary | ICD-10-CM

## 2024-06-27 DIAGNOSIS — K7460 Unspecified cirrhosis of liver: Secondary | ICD-10-CM

## 2024-06-27 DIAGNOSIS — Z794 Long term (current) use of insulin: Secondary | ICD-10-CM

## 2024-06-27 DIAGNOSIS — K219 Gastro-esophageal reflux disease without esophagitis: Secondary | ICD-10-CM

## 2024-06-27 DIAGNOSIS — Z125 Encounter for screening for malignant neoplasm of prostate: Secondary | ICD-10-CM

## 2024-06-27 MED ORDER — OMEPRAZOLE 20 MG PO CPDR
20.0000 mg | DELAYED_RELEASE_CAPSULE | Freq: Every day | ORAL | 3 refills | Status: AC
Start: 2024-06-27 — End: ?

## 2024-06-27 NOTE — Progress Notes (Signed)
 Established Patient Office Visit   Subjective  Patient ID: Omar Travis, male    DOB: 1965-12-03  Age: 58 y.o. MRN: [id number]  Chief Complaint  Patient presents with   Annual Exam    Patient is a 58 year old male seen for CPE.  Patient is not fasting had a ham biscuit around 8:30 AM.  Patient states he is doing well overall.  Stop wearing Dexcom sensor 2 months ago as it was annoying.  Not currently checking blood sugar.  Not consistently making diet changes.  Last seen by endocrinology several months ago.  States A1c has come down some, was around 8 or 9 at last check.  Patient followed by GI/hepatology for history of cirrhosis.  States numbers have improved when last checked.  Requesting refill on PPI.  BP has been good.  Denies feeling bad.  Lower today than typical.  Patient is expecting a grandson in early 2026.  Patient's daughter will also be getting married in the spring 2026.    Patient Active Problem List   Diagnosis Date Noted   Medication monitoring encounter 09/29/2021   PICC (peripherally inserted central catheter) in place 09/29/2021   Encounter to obtain excuse from work 09/29/2021   Cirrhosis of liver without ascites (HCC) 09/26/2021   Recurrent cholangitis (HCC) 09/26/2021   Biliary obstruction due to malignant neoplasm (HCC) 09/05/2021   NASH (nonalcoholic steatohepatitis) 09/05/2021   Vaccine counseling 09/05/2021   Liver abscess 08/30/2021   Generalized weakness    Pancytopenia (HCC) 07/24/2021   Febrile illness, acute 07/22/2021   Cholangitis (HCC) 07/14/2021   Choledocholithiasis    Depression, recurrent 11/24/2019   Gastroesophageal reflux disease 06/22/2019   Hyperlipidemia 06/22/2019   Diabetes mellitus (HCC)    Essential hypertension    Past Medical History:  Diagnosis Date   Biliary obstruction due to malignant neoplasm (HCC) 09/05/2021   Diabetes (HCC)    GERD (gastroesophageal reflux disease)    HTN (hypertension)    Hyperlipidemia     Liver abscess 08/30/2021   NASH (nonalcoholic steatohepatitis) 09/05/2021   Vaccine counseling 09/05/2021   Past Surgical History:  Procedure Laterality Date   BILIARY BRUSHING  09/02/2021   Procedure: BILIARY BRUSHING;  Surgeon: Rosalie Kitchens, MD;  Location[facility] ENDOSCOPY;  Service: Endoscopy;;   BILIARY BRUSHING  10/01/2021   Procedure: BILIARY BRUSHING;  Surgeon: Rosalie Kitchens, MD;  Location: [facility] ENDOSCOPY;  Service: Gastroenterology;;   BILIARY STENT PLACEMENT  09/02/2021   Procedure: BILIARY STENT PLACEMENT;  Surgeon: Rosalie Kitchens, MD;  Location: [facility] ENDOSCOPY;  Service: Endoscopy;;   BIOPSY  09/02/2021   Procedure: BIOPSY;  Surgeon: Rosalie Kitchens, MD;  Location: [facility] ENDOSCOPY;  Service: Endoscopy;;   BIOPSY  10/01/2021   Procedure: BIOPSY;  Surgeon: Rosalie Kitchens, MD;  Location: [facility] ENDOSCOPY;  Service: Gastroenterology;;   CHOLECYSTECTOMY N/A 07/17/2021   Procedure: LAPAROSCOPIC CHOLECYSTECTOMY;  Surgeon: Signe Mitzie LABOR, MD;  Location: [facility] OR;  Service: General;  Laterality: N/A;   ENDOSCOPIC RETROGRADE CHOLANGIOPANCREATOGRAPHY (ERCP) WITH PROPOFOL  N/A 07/16/2021   Procedure: ENDOSCOPIC RETROGRADE CHOLANGIOPANCREATOGRAPHY (ERCP) WITH PROPOFOL ;  Surgeon: Rosalie Kitchens, MD;  Location: [facility] ENDOSCOPY;  Service: Endoscopy;  Laterality: N/A;   ENDOSCOPIC RETROGRADE CHOLANGIOPANCREATOGRAPHY (ERCP) WITH PROPOFOL  N/A 10/01/2021   Procedure: ENDOSCOPIC RETROGRADE CHOLANGIOPANCREATOGRAPHY (ERCP) WITH PROPOFOL ;  Surgeon: Rosalie Kitchens, MD;  Location: [facility] ENDOSCOPY;  Service: Gastroenterology;  Laterality: N/A;   ERCP N/A 08/22/2021   Procedure: ENDOSCOPIC RETROGRADE CHOLANGIOPANCREATOGRAPHY (ERCP);  Surgeon: Rosalie Kitchens, MD;  Location: [facility] ENDOSCOPY;  Service: Endoscopy;  Laterality: N/A;  ERCP N/A 09/02/2021   Procedure: ENDOSCOPIC RETROGRADE CHOLANGIOPANCREATOGRAPHY (ERCP);  Surgeon: Rosalie Kitchens, MD;  Location: [facility] ENDOSCOPY;  Service: Endoscopy;  Laterality: N/A;   ESOPHAGOGASTRODUODENOSCOPY N/A 10/01/2021    Procedure: ESOPHAGOGASTRODUODENOSCOPY (EGD);  Surgeon: Burnette Fallow, MD;  Location: [facility] ENDOSCOPY;  Service: Gastroenterology;  Laterality: N/A;   EUS  10/01/2021   Procedure: UPPER ENDOSCOPIC ULTRASOUND (EUS) LINEAR;  Surgeon: Burnette Fallow, MD;  Location: [facility] ENDOSCOPY;  Service: Gastroenterology;;   FINE NEEDLE ASPIRATION N/A 10/01/2021   Procedure: FINE NEEDLE ASPIRATION (FNA) LINEAR;  Surgeon: Burnette Fallow, MD;  Location: [facility] ENDOSCOPY;  Service: Gastroenterology;  Laterality: N/A;   LITHOTRIPSY  10/01/2021   Procedure: LITHOTRIPSY;  Surgeon: Rosalie Kitchens, MD;  Location: [facility] ENDOSCOPY;  Service: Gastroenterology;;   PANCREATIC STENT PLACEMENT  07/16/2021   Procedure: PANCREATIC STENT PLACEMENT;  Surgeon: Rosalie Kitchens, MD;  Location: [facility] ENDOSCOPY;  Service: Endoscopy;;   REMOVAL OF STONES  07/16/2021   Procedure: REMOVAL OF STONES;  Surgeon: Rosalie Kitchens, MD;  Location: [facility] ENDOSCOPY;  Service: Endoscopy;;   REMOVAL OF STONES  08/22/2021   Procedure: REMOVAL OF STONES;  Surgeon: Rosalie Kitchens, MD;  Location: [facility] ENDOSCOPY;  Service: Endoscopy;;   REMOVAL OF STONES  09/02/2021   Procedure: REMOVAL OF STONES;  Surgeon: Rosalie Kitchens, MD;  Location: [facility] ENDOSCOPY;  Service: Endoscopy;;   REMOVAL OF STONES  10/01/2021   Procedure: REMOVAL OF STONES;  Surgeon: Rosalie Kitchens, MD;  Location: [facility] ENDOSCOPY;  Service: Gastroenterology;;   ANNETT  07/16/2021   Procedure: ANNETT;  Surgeon: Rosalie Kitchens, MD;  Location: [facility] ENDOSCOPY;  Service: Endoscopy;;   SPHINCTEROTOMY  08/22/2021   Procedure: ANNETT;  Surgeon: Rosalie Kitchens, MD;  Location: [facility] ENDOSCOPY;  Service: Endoscopy;;   SPYGLASS CHOLANGIOSCOPY N/A 09/02/2021   Procedure: DEBHOJDD CHOLANGIOSCOPY;  Surgeon: Rosalie Kitchens, MD;  Location: [facility] ENDOSCOPY;  Service: Endoscopy;  Laterality: N/A;   SPYGLASS CHOLANGIOSCOPY N/A 10/01/2021   Procedure: SPYGLASS CHOLANGIOSCOPY;  Surgeon: Rosalie Kitchens, MD;  Location: [facility] ENDOSCOPY;  Service:  Gastroenterology;  Laterality: N/A;   SPYGLASS LITHOTRIPSY N/A 09/02/2021   Procedure: DEBHOJDD LITHOTRIPSY;  Surgeon: Rosalie Kitchens, MD;  Location: [facility] ENDOSCOPY;  Service: Endoscopy;  Laterality: N/A;   STENT REMOVAL  10/01/2021   Procedure: STENT REMOVAL;  Surgeon: Burnette Fallow, MD;  Location: [facility] ENDOSCOPY;  Service: Gastroenterology;;   Social History   Tobacco Use   Smoking status: Never   Smokeless tobacco: Never  Substance Use Topics   Alcohol use: Never    Comment: Social drinker only on occasion   Drug use: Never   Family History  Problem Relation Age of Onset   Diabetes Mother    Liver cancer Father    Diabetes Father    Allergies  Allergen Reactions   Bee Venom Hives and Swelling   Benadryl  Allergy [Diphenhydramine  Hcl] Rash   Flexeril  [Cyclobenzaprine ] Rash    ROS Negative unless stated above    Objective:     BP 102/76 (BP Location: Left Arm, Patient Position: Sitting, Cuff Size: Large)   Pulse (!) 105   Temp 98.8 F (37.1 C) (Oral)   Ht 6' (1.829 m)   Wt 243 lb (110.2 kg)   SpO2 97%   BMI 32.96 kg/m  BP Readings from Last 3 Encounters:  06/27/24 102/76  12/14/23 128/76  09/08/22 102/68   Wt Readings from Last 3 Encounters:  06/27/24 243 lb (110.2 kg)  12/14/23 241 lb 12.8 oz (109.7 kg)  09/08/22 242 lb (109.8 kg)  Physical Exam Constitutional:      Appearance: Normal appearance.  HENT:     Head: Normocephalic and atraumatic.     Right Ear: Tympanic membrane, ear canal and external ear normal.     Left Ear: Tympanic membrane, ear canal and external ear normal.     Nose: Nose normal.     Mouth/Throat:     Mouth: Mucous membranes are moist.     Pharynx: No oropharyngeal exudate or posterior oropharyngeal erythema.  Eyes:     General: No scleral icterus.    Extraocular Movements: Extraocular movements intact.     Conjunctiva/sclera: Conjunctivae normal.     Pupils: Pupils are equal, round, and reactive to light.  Neck:      Thyroid: No thyromegaly.     Vascular: No carotid bruit.  Cardiovascular:     Rate and Rhythm: Normal rate and regular rhythm.     Pulses: Normal pulses.     Heart sounds: Normal heart sounds. No murmur heard.    No friction rub.  Pulmonary:     Effort: Pulmonary effort is normal.     Breath sounds: Normal breath sounds. No wheezing, rhonchi or rales.  Abdominal:     General: Bowel sounds are normal.     Palpations: Abdomen is soft.     Tenderness: There is no abdominal tenderness.  Musculoskeletal:        General: No deformity. Normal range of motion.  Lymphadenopathy:     Cervical: No cervical adenopathy.  Skin:    General: Skin is warm and dry.     Findings: No lesion.     Comments: Onychomycosis of bilateral toenails.  Plantar warts of bilateral feet.  Neurological:     General: No focal deficit present.     Mental Status: He is alert and oriented to person, place, and time.  Psychiatric:        Mood and Affect: Mood normal.        Thought Content: Thought content normal.        06/27/2024    1:37 PM 12/14/2023    5:36 PM 09/08/2022    1:29 PM  Depression screen PHQ 2/9  Decreased Interest 0 0 0  Down, Depressed, Hopeless 0 0 0  PHQ - 2 Score 0 0 0  Altered sleeping 0  1  Tired, decreased energy 1  1  Change in appetite 1  1  Feeling bad or failure about yourself  0  0  Trouble concentrating 0  0  Moving slowly or fidgety/restless 0  0  Suicidal thoughts 0  0  PHQ-9 Score 2  3      06/27/2024    1:37 PM 12/14/2023    5:36 PM 07/30/2020    9:52 AM  GAD 7 : Generalized Anxiety Score  Nervous, Anxious, on Edge 0 0 1  Control/stop worrying 0 0 1  Worry too much - different things 1 0 1  Trouble relaxing 0 0 1  Restless 0 0 1  Easily annoyed or irritable 0 0 2  Afraid - awful might happen 1 0 2  Total GAD 7 Score 2 0 9  Anxiety Difficulty Not difficult at all Not difficult at all Somewhat difficult     No results found for any visits on 06/27/24.     Assessment & Plan:   Well adult exam -     CBC with Differential/Platelet; Future -     Comprehensive metabolic panel with GFR; Future -  Hemoglobin A1c; Future -     TSH; Future -     T4, free; Future -     Lipid panel; Future  Type 2 diabetes mellitus with other specified complication, with long-term current use of insulin  (HCC) -     Hemoglobin A1c; Future -     Microalbumin / creatinine urine ratio  Mixed hyperlipidemia -     Lipid panel; Future  Need for influenza vaccination -     Flu vaccine trivalent PF, 6mos and older(Flulaval,Afluria,Fluarix,Fluzone)  Essential hypertension -     Comprehensive metabolic panel with GFR; Future -     TSH; Future -     T4, free; Future -     Omeprazole ; Take 1 capsule (20 mg total) by mouth daily.  Dispense: 90 capsule; Refill: 3  Gastroesophageal reflux disease, unspecified whether esophagitis present -     Omeprazole ; Take 1 capsule (20 mg total) by mouth daily.  Dispense: 90 capsule; Refill: 3 -     Vitamin B12; Future  Screening for prostate cancer -     PSA; Future  Cirrhosis of liver without ascites, unspecified hepatic cirrhosis type (HCC) -     Comprehensive metabolic panel with GFR; Future   Age-appropriate health screenings discussed.  Obtain labs.  Patient is not fasting however elected to have labs drawn as lives out of town.  Immunizations reviewed.  Influenza vaccine given this visit.  Patient to consider Tdap especially as expecting new grandson in January.  BP well-controlled.  Continue current medications.  Foot exam done this visit.  Patient to schedule eye exam.  Colonoscopy up-to-date done 10/10/2019 with 10-year recall.  Continue follow-up with specialist.  Follow-up in 4-6 months.  Return in about 5 months (around 11/25/2024) for chronic conditions.   Clotilda JONELLE Single, MD

## 2024-06-28 LAB — CBC WITH DIFFERENTIAL/PLATELET
Absolute Lymphocytes: 1497 {cells}/uL (ref 850–3900)
Absolute Monocytes: 502 {cells}/uL (ref 200–950)
Basophils Absolute: 53 {cells}/uL (ref 0–200)
Basophils Relative: 0.7 %
Eosinophils Absolute: 68 {cells}/uL (ref 15–500)
Eosinophils Relative: 0.9 %
HCT: 46.3 % (ref 38.5–50.0)
Hemoglobin: 15.2 g/dL (ref 13.2–17.1)
MCH: 27.5 pg (ref 27.0–33.0)
MCHC: 32.8 g/dL (ref 32.0–36.0)
MCV: 83.7 fL (ref 80.0–100.0)
MPV: 12.2 fL (ref 7.5–12.5)
Monocytes Relative: 6.6 %
Neutro Abs: 5480 {cells}/uL (ref 1500–7800)
Neutrophils Relative %: 72.1 %
Platelets: 164 Thousand/uL (ref 140–400)
RBC: 5.53 Million/uL (ref 4.20–5.80)
RDW: 13.5 % (ref 11.0–15.0)
Total Lymphocyte: 19.7 %
WBC: 7.6 Thousand/uL (ref 3.8–10.8)

## 2024-06-28 LAB — COMPREHENSIVE METABOLIC PANEL WITH GFR
AG Ratio: 1.8 (calc) (ref 1.0–2.5)
ALT: 54 U/L — ABNORMAL HIGH (ref 9–46)
AST: 27 U/L (ref 10–35)
Albumin: 4.6 g/dL (ref 3.6–5.1)
Alkaline phosphatase (APISO): 102 U/L (ref 35–144)
BUN: 18 mg/dL (ref 7–25)
CO2: 23 mmol/L (ref 20–32)
Calcium: 9.3 mg/dL (ref 8.6–10.3)
Chloride: 102 mmol/L (ref 98–110)
Creat: 0.96 mg/dL (ref 0.70–1.30)
Globulin: 2.5 g/dL (ref 1.9–3.7)
Glucose, Bld: 147 mg/dL — ABNORMAL HIGH (ref 65–99)
Potassium: 4.1 mmol/L (ref 3.5–5.3)
Sodium: 135 mmol/L (ref 135–146)
Total Bilirubin: 0.4 mg/dL (ref 0.2–1.2)
Total Protein: 7.1 g/dL (ref 6.1–8.1)
eGFR: 93 mL/min/1.73m2 (ref >=60)

## 2024-06-28 LAB — LIPID PANEL
Cholesterol: 182 mg/dL (ref <200)
HDL: 42 mg/dL (ref >=40)
Non-HDL Cholesterol (Calc): 140 mg/dL — ABNORMAL HIGH (ref <130)
Total CHOL/HDL Ratio: 4.3 (calc) (ref <5.0)
Triglycerides: 453 mg/dL — ABNORMAL HIGH (ref <150)

## 2024-06-28 LAB — HEMOGLOBIN A1C
Hgb A1c MFr Bld: 9.2 % — ABNORMAL HIGH (ref <5.7)
Mean Plasma Glucose: 217 mg/dL
eAG (mmol/L): 12 mmol/L

## 2024-06-28 LAB — VITAMIN B12: Vitamin B-12: 515 pg/mL (ref 200–1100)

## 2024-06-28 LAB — T4, FREE: Free T4: 1.2 ng/dL (ref 0.8–1.8)

## 2024-06-28 LAB — PSA: PSA: 0.6 ng/mL (ref <=4.00)

## 2024-06-28 LAB — MICROALBUMIN / CREATININE URINE RATIO
Creatinine,U: 35.9 mg/dL
Microalb Creat Ratio: UNDETERMINED mg/g (ref 0.0–30.0)
Microalb, Ur: <0.7 mg/dL

## 2024-06-28 LAB — TSH: TSH: 1.36 m[IU]/L (ref 0.40–4.50)

## 2024-07-11 ENCOUNTER — Ambulatory Visit: Payer: Self-pay | Admitting: Family Medicine

## 2024-07-11 DIAGNOSIS — E781 Pure hyperglyceridemia: Secondary | ICD-10-CM

## 2024-07-25 ENCOUNTER — Other Ambulatory Visit: Payer: Self-pay | Admitting: Family Medicine

## 2024-07-25 DIAGNOSIS — I10 Essential (primary) hypertension: Secondary | ICD-10-CM
